# Patient Record
Sex: Male | Born: 1950
Health system: Southern US, Community
[De-identification: ages and names within clinical notes are randomized; demographics above are authoritative.]

## PROBLEM LIST (undated history)

## (undated) DIAGNOSIS — G43909 Migraine, unspecified, not intractable, without status migrainosus: Secondary | ICD-10-CM

## (undated) DIAGNOSIS — F329 Major depressive disorder, single episode, unspecified: Secondary | ICD-10-CM

## (undated) DIAGNOSIS — D369 Benign neoplasm, unspecified site: Secondary | ICD-10-CM

## (undated) DIAGNOSIS — I619 Nontraumatic intracerebral hemorrhage, unspecified: Secondary | ICD-10-CM

## (undated) DIAGNOSIS — Z8719 Personal history of other diseases of the digestive system: Secondary | ICD-10-CM

## (undated) DIAGNOSIS — I2510 Atherosclerotic heart disease of native coronary artery without angina pectoris: Secondary | ICD-10-CM

## (undated) DIAGNOSIS — T887XXA Unspecified adverse effect of drug or medicament, initial encounter: Secondary | ICD-10-CM

## (undated) DIAGNOSIS — R42 Dizziness and giddiness: Secondary | ICD-10-CM

## (undated) DIAGNOSIS — G4700 Insomnia, unspecified: Secondary | ICD-10-CM

## (undated) DIAGNOSIS — F32A Depression, unspecified: Secondary | ICD-10-CM

## (undated) DIAGNOSIS — F319 Bipolar disorder, unspecified: Secondary | ICD-10-CM

## (undated) DIAGNOSIS — I7121 Aneurysm of the ascending aorta, without rupture: Secondary | ICD-10-CM

## (undated) DIAGNOSIS — E785 Hyperlipidemia, unspecified: Secondary | ICD-10-CM

## (undated) DIAGNOSIS — N411 Chronic prostatitis: Secondary | ICD-10-CM

## (undated) HISTORY — DX: Bipolar disorder, unspecified: F31.9

## (undated) HISTORY — DX: Insomnia, unspecified: G47.00

## (undated) HISTORY — DX: Chronic prostatitis: N41.1

## (undated) HISTORY — PX: OTHER SURGICAL HISTORY: SHX169

## (undated) HISTORY — DX: Hyperlipidemia, unspecified: E78.5

## (undated) HISTORY — DX: Personal history of other diseases of the digestive system: Z87.19

## (undated) HISTORY — DX: Atherosclerotic heart disease of native coronary artery without angina pectoris: I25.10

## (undated) HISTORY — DX: Unspecified adverse effect of drug or medicament, initial encounter: T88.7XXA

## (undated) HISTORY — DX: Depression, unspecified: F32.A

## (undated) HISTORY — DX: Nontraumatic intracerebral hemorrhage, unspecified: I61.9

## (undated) HISTORY — DX: Benign neoplasm, unspecified site: D36.9

## (undated) HISTORY — DX: Dizziness and giddiness: R42

## (undated) HISTORY — DX: Aneurysm of the ascending aorta, without rupture: I71.21

## (undated) HISTORY — DX: Migraine, unspecified, not intractable, without status migrainosus: G43.909

---
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
## 2000-08-31 DIAGNOSIS — Z8719 Personal history of other diseases of the digestive system: Secondary | ICD-10-CM

## 2000-08-31 HISTORY — PX: COLONOSCOPY: SHX174

## 2000-08-31 HISTORY — PX: OTHER SURGICAL HISTORY: SHX169

## 2000-08-31 HISTORY — DX: Personal history of other diseases of the digestive system: Z87.19

## 2001-03-21 ENCOUNTER — Encounter: Payer: Self-pay | Admitting: Emergency Medicine

## 2001-03-21 ENCOUNTER — Inpatient Hospital Stay (HOSPITAL_COMMUNITY): Admission: EM | Admit: 2001-03-21 | Discharge: 2001-03-25 | Payer: Self-pay | Admitting: Emergency Medicine

## 2001-03-31 ENCOUNTER — Encounter: Payer: Self-pay | Admitting: Gastroenterology

## 2001-03-31 ENCOUNTER — Ambulatory Visit (HOSPITAL_COMMUNITY): Admission: RE | Admit: 2001-03-31 | Discharge: 2001-03-31 | Payer: Self-pay | Admitting: Gastroenterology

## 2001-05-27 ENCOUNTER — Ambulatory Visit (HOSPITAL_COMMUNITY): Admission: RE | Admit: 2001-05-27 | Discharge: 2001-05-27 | Payer: Self-pay | Admitting: Gastroenterology

## 2001-05-27 ENCOUNTER — Encounter: Payer: Self-pay | Admitting: Gastroenterology

## 2001-08-26 ENCOUNTER — Ambulatory Visit (HOSPITAL_COMMUNITY): Admission: RE | Admit: 2001-08-26 | Discharge: 2001-08-26 | Payer: Self-pay | Admitting: Orthopedic Surgery

## 2002-11-30 ENCOUNTER — Encounter: Admission: RE | Admit: 2002-11-30 | Discharge: 2002-11-30 | Payer: Self-pay | Admitting: Sports Medicine

## 2003-04-13 ENCOUNTER — Other Ambulatory Visit: Admission: RE | Admit: 2003-04-13 | Discharge: 2003-04-13 | Payer: Self-pay | Admitting: Family Medicine

## 2010-01-29 DIAGNOSIS — F319 Bipolar disorder, unspecified: Secondary | ICD-10-CM

## 2010-01-29 HISTORY — DX: Bipolar disorder, unspecified: F31.9

## 2010-07-01 DIAGNOSIS — R42 Dizziness and giddiness: Secondary | ICD-10-CM

## 2010-07-01 HISTORY — DX: Dizziness and giddiness: R42

## 2011-01-16 NOTE — Procedures (Signed)
[place]. [facility]  Patient:    Bryce Logan, Bryce Logan                          MRN: [id number] Proc. Date: 03/21/01 Adm. Date:  [id number] Attending:  Roque Lias CC:         Reuben Likes, M.D.  Carolyne Fiscal, M.D.   Procedure Report  PROCEDURE PERFORMED:  Esophagogastroduodenoscopy.  ENDOSCOPIST:  Everardo All. Madilyn Fireman, M.D.  INDICATIONS FOR PROCEDURE:  GI bleeding with suspected upper source.  DESCRIPTION OF PROCEDURE:  The patient was placed in the left lateral decubitus position and placed on the pulse monitor with continuous low flow oxygen delivered by nasal cannula.  He was sedated with 30 mg IV Demerol and 7 mg IV Versed.  The Olympus video endoscope was advanced under direct vision into the oropharynx and esophagus.  The esophagus was straight and of normal caliber with the squamocolumnar line at 38 cm.  There appeared to be a subtle lower esophageal ring above an approximately 1 cm hiatal hernia.  There was no visible esophagitis, stricture, esophageal varices, Mallory-Weiss tear or other abnormality of the gastroesophageal junction.  The stomach was entered and a small amount of liquid secretions was suctioned from the fundus. Retroflex view of the cardia was unremarkable.  The fundus and body appeared normal.  The antrum showed a few streaks of erythema with granularity consistent with antral gastritis.  The pylorus was nondeformed and easily allowed passage of the endoscope tip to the duodenum.  Both the bulb and second portion were well inspected and appeared within normal limits.  The scope was withdrawn into the stomach and a CLO test obtained.  The scope was then withdrawn and the patient returned to the recovery room in stable condition.  The patient tolerated the procedure well and there were no immediate complications.  IMPRESSION: 1. Mild antral gastritis. 2. Small hiatal hernia with lower esophageal ring. 3. No obvious upper  source of gastrointestinal bleeding.  PLAN:  Transfuse as needed and will pursue colonoscopy within the next few days.DD:  03/21/01 TD:  03/22/01 Job: [id number]

## 2011-01-16 NOTE — Op Note (Signed)
[facility]  Patient:    Bryce Logan, Bryce Logan Visit Number: [id number] MRN: [id number]          Service Type: DSU Location: DAY Attending Physician:  Marlowe Kays Page Dictated by:   Illene Labrador. Aplington, M.D. Proc. Date: 08/26/01 Admit Date:  08/26/2001                             Operative Report  PREOPERATIVE DIAGNOSIS:  Torn medial meniscus, left knee.  POSTOPERATIVE DIAGNOSIS:  Torn medial meniscus, left knee.  OPERATION:  Left knee arthroscopy with partial medial meniscectomy.  SURGEON:  Illene Labrador. Aplington, M.D.  ASSISTANT:  Nurse.  ANESTHESIA:  General.  PATHOLOGY/JUSTIFICATION FOR PROCEDURE:  He has medial knee pain for several years, and feeling something slipping in and out of the inner knee.  An MRI has indicated he has a comminuted posterior horn tear of the medial meniscus. He actually had a bucket-handle type tear with also disruption of the anterior third, with a good bit of fibrotic synovitis.  The rest of the knee looked unremarkable.  DESCRIPTION OF PROCEDURE:  Under satisfactory general anesthesia, pneumatic tourniquet and thigh stabilizer placed, the left knee was prepped with Duraprep and draped as a sterile field.  Superior and medial saline inflow. Posterior and anterolateral portal, the medial compartment of the knee joint was evaluated.  Extensive fibrotic synovium was noted in the anterior portion, which was resected with 3.5 shaver.  A flap-type tear of the medial meniscus was also noted, and I smoothed this down to a stable rim.  The joint surfaces looked unremarkable posteriorly.  I was able to place a probe in the posterior underneath surface of the meniscus, and bring it well into the joint.  The underneath surface tear revealed the surface of the underneath tear.  I then prepped the meniscus of delta junction of the mid posterior third with scissors, and then gradually worked my way back with baskets until I  arrived at a stable rim posteriorly; which left available rim remaining.  I tailored the junction of the mid posterior third with scissors, baskets and superinfarct shaver.  Also carried the tailoring around to basically the intercondylar attachment of the medial meniscus.  A final endpoint was very small remaining posterior rim, which was stable.  Final pictures were taken. I looked at the knee again and suprapatellar area; there was minimal wear on the patella.  Reversing pulls of the lateral compartment of knee joint looked unremarkable.  The lateral pedal was also unremarkable.  The knee joint was then irrigated until clear, and all ______ were removed.  The trans portals were closed with 4-0 nylon.  Then 20 cc of 0.5% Marcaine with adrenaline and 4 mg of morphine were then injected into the inflow portal, which was removed, and this portal closed with 4-0 nylon as well.   Betadine and Adaptic dry sterile dressing were applied.  The tourniquet was released.  He tolerated the procedure well, and was taken to the recovery room in satisfactory condition with no known complication. Dictated by:   Illene Labrador. Aplington, M.D. Attending Physician:  Joaquin Courts DD:  08/26/01 TD:  08/27/01 Job: [id number]

## 2011-01-16 NOTE — Discharge Summary (Signed)
[place]. [facility]  Patient:    Bryce Logan, Bryce Logan                          MRN: [id number] Adm. Date:  [id number] Disc. Date: [id number] Attending:  Roque Lias CC:         Carolyne Fiscal, M.D.   Discharge Summary  HISTORY OF PRESENT ILLNESS:  The patient is a 60 year old male who had had a five-day history of profound weakness, fatigue, and postural presyncope. Shortly after the onset of these symptoms, he noted dark maroon-colored blood in his stool.  He was seen in our office on Jan 16, 2001, at which time his hemoglobin was 13.3 and his CMET was within normal limits.  Since that time, he continued to pass maroon-colored blood and has gotten progressively weaker.  He denied any abdominal pain, nausea, or vomiting.  He had a similar episode in 1994.  Work-up at that time showed only gastritis.  The patient denies any use of NSAIDs, aspirin, alcohol, or tobacco.  PHYSICAL EXAMINATION:  The blood pressure was 82/50, the pulse was 88, respirations were 20, and the temperature was 95.8 degrees.  GENERAL APPEARANCE:  The patient appeared pale and listless.  He was lying on the exam table and unable to sit up.  SKIN:  Pale, but warm and dry.  HEENT:  Eyes:  Pupils equal, round, and reactive to light.  Fundi benign.  The sclerae were somewhat pale.  ENT exam normal.  NECK:  No adenopathy.  Thyroid normal.  LUNGS:  Clear.  HEART:  Regular rhythm.  No gallop or murmur.  ABDOMEN:  Soft and nontender.  No organomegaly or mass.  Bowel sounds normally active.  RECTAL:  No masses.  Maroon-colored heme-positive stool.  EXTREMITIES:  Pulses were weak and thready.  NEUROLOGIC:  Within normal limits.  ADMITTING IMPRESSION:  Gastrointestinal bleed, possible upper gastrointestinal with the source being ulcer or gastritis or lower gastrointestinal possibly being diverticular bleed or angiodysplasia.  SUMMARY OF LABORATORY DATA:  His EKG on admission  showed normal sinus rhythm. The chest x-ray showed probable COPD with hyperaeration, otherwise was normal with no active lung disease.  On admission, his hemoglobin was 7.2, hematocrit 20.0, and white count 8900. After infusion, his hemoglobin increased to 9.1, but then fell to 7.7.  On March 23, 2001, after another two units of packed cells, his hemoglobin remained stable and as of the day of discharge was 10.6.  His INR was 1.2. CMET showed a sodium of 136, potassium 4.0, glucose 147, BUN 24, and creatinine 0.9.  His Helicobacter pylori antibody was negative.  HOSPITAL COURSE:  The patient was hospitalized in the step-down unit and vital signs were obtained every two hours.  He was given normal saline at 250 cc/hr. He was made NPO, except for clear sips and his medications.  He was given Pepcid 20 mg IV every 12 hours, alprazolam 0.5 mg every eight hours p.r.n., and Ambien for sleep.  He was given two units of packed red blood cells in the emergency room and also was seen by Jonny Ruiz C. Madilyn Fireman, M.D., in the emergency room and underwent upper endoscopy, which just showed some mild gastritis, but no evidence of upper GI bleeding or ulcer.  The following day, he had no passed any blood or stool since his endoscopy. His hemoglobin was up to 9.1.  He was prepped for a colonoscopy.  After his colonoscopy prep,  he did pass a large amount of dark red blood.  His colonoscopy was also done by Jonny Ruiz C. Madilyn Fireman, M.D., showed some residual dark blood, but no active bleeding.  This was continued all the way up into the terminal ileum.  No active bleeding site was noted.  Over the next several days, the patient had no further evidence of any bleeding.  His hemoglobin did drop to 7.7 on March 23, 2001, and he was given two more units of packed red blood cells.  On March 23, 2001, he had no evidence of further BMs or GI bleeding.  He was transferred to telemetry as of that date.  His hemoglobin immediately  after the transfusion was 11.4 and the next day was 10.3.  He was observed over the next 24 hours.  As of March 25, 2001, he passed a normal brown stool the night before without blood.  He had no abdominal pain, nausea, or vomiting.  His blood pressure was 105/64 and his pulse was 75.  The lungs were clear.  The cardiac rhythm was regular with no murmur.  The abdomen was negative.  The CBC showed a hemoglobin of 10.6.  He was discharged on that date to be followed up by Carolyne Fiscal, M.D., and Everardo All. Madilyn Fireman, M.D.  Dr. Madilyn Fireman did order a small bowel series to be done as well.  FINAL DIAGNOSES: 1. Gastrointestinal bleed of undetermined cause.  It is suspected that this    could be angiodysplasia of the small intestine.  There was no evidence in    the upper gastrointestinal or lower gastrointestinal of any obvious    bleeding site. 2. Anemia secondary to blood loss. 3. Mild gastritis on upper endoscopy.  DISCHARGE MEDICATIONS:  Ferrous sulfate 325 mg t.i.d. with food.  ACTIVITY:  As tolerated.  DIET:  Regular.  FOLLOW-UP:  He is to follow up with Carolyne Fiscal, M.D., in a week and also to follow up with Coner C. Madilyn Fireman, M.D. DD:  04/09/01 TD:  04/10/01 Job: [id number]

## 2011-09-01 DIAGNOSIS — I619 Nontraumatic intracerebral hemorrhage, unspecified: Secondary | ICD-10-CM

## 2011-09-01 DIAGNOSIS — S0636AA Traumatic hemorrhage of cerebrum, unspecified, with loss of consciousness status unknown, initial encounter: Secondary | ICD-10-CM

## 2011-09-01 HISTORY — DX: Nontraumatic intracerebral hemorrhage, unspecified: I61.9

## 2011-09-01 HISTORY — DX: Traumatic hemorrhage of cerebrum, unspecified, with loss of consciousness status unknown, initial encounter: S06.36AA

## 2011-09-17 DIAGNOSIS — D369 Benign neoplasm, unspecified site: Secondary | ICD-10-CM

## 2011-09-17 HISTORY — DX: Benign neoplasm, unspecified site: D36.9

## 2013-09-19 ENCOUNTER — Other Ambulatory Visit (HOSPITAL_COMMUNITY): Payer: Self-pay | Admitting: Family Medicine

## 2013-09-19 DIAGNOSIS — I359 Nonrheumatic aortic valve disorder, unspecified: Secondary | ICD-10-CM

## 2013-09-28 ENCOUNTER — Ambulatory Visit (HOSPITAL_COMMUNITY)
Admission: RE | Admit: 2013-09-28 | Discharge: 2013-09-28 | Disposition: A | Discharge status: Home / Self Care | Payer: BC Managed Care – PPO | Source: Ambulatory Visit | Attending: Family Medicine | Admitting: Family Medicine

## 2013-09-28 DIAGNOSIS — I359 Nonrheumatic aortic valve disorder, unspecified: Secondary | ICD-10-CM | POA: Insufficient documentation

## 2013-09-28 NOTE — Progress Notes (Signed)
Echocardiogram 2D Echocardiogram has been performed.  Bryce Logan 09/28/2013, 3:15 PM

## 2013-12-18 ENCOUNTER — Other Ambulatory Visit: Payer: Self-pay | Admitting: Cardiology

## 2013-12-18 DIAGNOSIS — I712 Thoracic aortic aneurysm, without rupture, unspecified: Secondary | ICD-10-CM

## 2013-12-25 ENCOUNTER — Ambulatory Visit
Admission: RE | Admit: 2013-12-25 | Discharge: 2013-12-25 | Disposition: A | Discharge status: Home / Self Care | Payer: BC Managed Care – PPO | Source: Ambulatory Visit | Attending: Cardiology | Admitting: Cardiology

## 2013-12-25 DIAGNOSIS — I712 Thoracic aortic aneurysm, without rupture, unspecified: Secondary | ICD-10-CM

## 2015-02-20 ENCOUNTER — Other Ambulatory Visit: Payer: Self-pay | Admitting: Cardiology

## 2015-02-20 DIAGNOSIS — I719 Aortic aneurysm of unspecified site, without rupture: Secondary | ICD-10-CM

## 2015-02-20 DIAGNOSIS — I7121 Aneurysm of the ascending aorta, without rupture: Secondary | ICD-10-CM

## 2015-03-05 ENCOUNTER — Ambulatory Visit
Admission: RE | Admit: 2015-03-05 | Discharge: 2015-03-05 | Disposition: A | Discharge status: Home / Self Care | Payer: 59 | Source: Ambulatory Visit | Attending: Cardiology | Admitting: Cardiology

## 2015-03-05 DIAGNOSIS — I719 Aortic aneurysm of unspecified site, without rupture: Secondary | ICD-10-CM

## 2015-03-05 DIAGNOSIS — I7121 Aneurysm of the ascending aorta, without rupture: Secondary | ICD-10-CM

## 2015-03-05 MED ORDER — IOPAMIDOL (ISOVUE-370) INJECTION 76%
80.0000 mL | Freq: Once | INTRAVENOUS | Status: AC | PRN
  Administered 2015-03-05: 80 mL via INTRAVENOUS

## 2016-07-07 ENCOUNTER — Other Ambulatory Visit (HOSPITAL_COMMUNITY): Payer: Self-pay | Attending: Psychiatry | Admitting: Psychiatry

## 2016-07-07 ENCOUNTER — Encounter (INDEPENDENT_AMBULATORY_CARE_PROVIDER_SITE_OTHER): Payer: Self-pay

## 2016-07-07 DIAGNOSIS — F332 Major depressive disorder, recurrent severe without psychotic features: Secondary | ICD-10-CM

## 2016-07-07 NOTE — Progress Notes (Signed)
Psychiatric Initial Adult Assessment   Patient Identification: Bryce Logan MRN:  [id number] Date of Evaluation:  07/07/2016 Referral Source: Dr Toy Care Chief Complaintsevere depression not responding to medications  Visit Diagnosis:    ICD-9-CM ICD-10-CM   1. Severe recurrent major depression without psychotic features [facility]) 296.33 F33.2     History of Present Illness:  Bryce Logan has been depressed all his adult life and he says it has been consistent after taking floxin for prostate problems.  He has no energy, interest, motivation, feelings of hopelessness, no point in living, trouble getting and staying asleep, no pleasure in people or activities except music.  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  He has tried all the medications for depression and some of the augmentation medications without consistent help.  No diagnosis of bipolar or psychosis.  He has been diagnosed with adult ADHD and does respond to stimulants.  Anxiety is present but not extreme and is tied to the depression. Childhood was uneventful.  College was a real struggle due to the apparent ADHD.  Drug use is not an issue.  He was married in the 51's but not since ad has a son.  Associated Signs/Symptoms: Depression Symptoms:  depressed mood, anhedonia, insomnia, fatigue, feelings of worthlessness/guilt, difficulty concentrating, hopelessness, impaired memory, anxiety, loss of energy/fatigue, decreased appetite, (Hypo) Manic Symptoms:  Irritable Mood, Anxiety Symptoms:  Excessive Worry, Psychotic Symptoms:  none PTSD Symptoms: Negative  Past Psychiatric History: as related above  Previous Psychotropic Medications: Yes   Substance Abuse History in the last 12 months:  No.  Consequences of Substance Abuse: Negative  Past Medical History:  Past Medical History:  Diagnosis Date  . Chronic prostatitis    ELEVATED PSA . ED DR. DAHLSTEDT  . Dizziness 11/11   DR. ROSEN, NORMAL EF, MILD LEAKINESS OF  MITRAL VALVE, MILD STIFFNESS OF HEART MUSCLE, MILD ENLARGEMENT OF AORTIC ROOT, REPEAT IN 1 YEAR DR. Radford Pax, NL ETT DR. Radford Pax 12/11  . H/O: GI bleed 2002   DR. HAYES   . Insomnia    ON XANAX, DR. Sabra Heck  . Intracerebral hematoma 2013   [place]  . Mild bipolar disorder 01/2010   DR. PITTMAN DR. Toy Care, CHR DEPRESSION, FATIGUE OFF MEDS FOR AWHILE, CONTROLLED  . Tubular adenoma 09/17/11   [facility]    Past Surgical History:  Procedure Laterality Date  . COLONOSCOPY  2002  . LEFT KNEE ATHROSCOPY  2002  . LEFT LEG AMBULATORY PHLEBECTOMY  2002    Family Psychiatric History: none of relevance  Family History:  Family History  Problem Relation Age of Onset  . Cancer - Other Mother   . Hypertension Father   . Melanoma Sister   . Alcoholism Son     Social History:   Social History   Social History  . Marital status: Divorced    Spouse name: N/A  . Number of children: N/A  . Years of education: N/A   Social History Main Topics  . Smoking status: Never Smoker  . Smokeless tobacco: Not on file  . Alcohol use Yes     Comment: 2-6 PER WEEK  . Drug use: No  . Sexual activity: Not on file   Other Topics Concern  . Not on file   Social History Narrative  . No narrative on file    Additional Social History: none  Allergies:   Allergies  Allergen Reactions  . Sulfa Antibiotics     SEVERE FATIGUE  AND SIDE EFFECTS    Metabolic Disorder Labs: No results found for: HGBA1C, MPG No results found for: PROLACTIN No results found for: CHOL, TRIG, HDL, CHOLHDL, VLDL, LDLCALC   Current Medications: Current Outpatient Prescriptions  Medication Sig Dispense Refill  . ALPRAZolam (XANAX) 1 MG tablet Take 1 mg by mouth at bedtime as needed for anxiety.    . Ascorbic Acid (VITAMIN C) 1000 MG tablet Take 1,000 mg by mouth daily.    Javier Docker Oil 1000 MG CAPS Take 1,000 mg by mouth daily.    . magnesium 30 MG tablet Take 30 mg by mouth daily.    .  Multiple Vitamin (MULTIVITAMIN) capsule Take 1 capsule by mouth daily.    . Multiple Vitamins-Minerals (ANTIOXIDANT FORMULA SG) capsule Take 1 capsule by mouth daily.    Marland Kitchen OVER THE COUNTER MEDICATION LITHIUM OROTATE DIATOMASHOUS EARTH    . Vitamin D, Ergocalciferol, (DRISDOL) 50000 UNITS CAPS capsule Take 50,000 Units by mouth every 7 (seven) days.    Marland Kitchen zolpidem (AMBIEN) 10 MG tablet Take 10 mg by mouth at bedtime as needed for sleep.     No current facility-administered medications for this visit.     Neurologic: Headache: Negative Seizure: Negative Paresthesias:Negative  Musculoskeletal: Strength & Muscle Tone: within normal limits Gait & Station: normal Patient leans: N/A  Psychiatric Specialty Exam: ROS  There were no vitals taken for this visit.There is no height or weight on file to calculate BMI.  General Appearance: Well Groomed  Eye Contact:  Good  Speech:  Clear and Coherent  Volume:  Normal  Mood:  Depressed  Affect:  Congruent  Thought Process:  Coherent  Orientation:  Full (Time, Place, and Person)  Thought Content:  Logical  Suicidal Thoughts:  No  Homicidal Thoughts:  No  Memory:  Immediate;   Good Recent;   Good Remote;   Good  Judgement:  Good  Insight:  Good  Psychomotor Activity:  Normal  Concentration:  Concentration: Good and Attention Span: Good  Recall:  Good  Fund of Knowledge:Good  Language: Good  Akathisia:  Negative  Handed:  Right  AIMS (if indicated):  0  Assets:  Communication Skills Desire for Improvement Financial Resources/Insurance Housing Leisure Time [place] Talents/Skills Transportation Vocational/Educational  ADL's:  Intact  Cognition: WNL  Sleep:  poor    Treatment Plan Summary: Bryce Logan does not have any metal implants and qualifies for [place].  He elects to try ECT first and if needed will return to [place] in the future.   Donnelly Angelica, MD 11/7/20172:15 PM

## 2016-09-18 ENCOUNTER — Institutional Professional Consult (permissible substitution): Payer: Self-pay | Admitting: Psychiatry

## 2016-10-09 ENCOUNTER — Encounter: Payer: Self-pay | Admitting: Psychiatry

## 2016-10-09 ENCOUNTER — Ambulatory Visit (INDEPENDENT_AMBULATORY_CARE_PROVIDER_SITE_OTHER): Payer: BLUE CROSS/BLUE SHIELD | Admitting: Psychiatry

## 2016-10-09 VITALS — BP 125/84 | HR 96 | Temp 98.1°F | Wt 162.4 lb

## 2016-10-09 DIAGNOSIS — F332 Major depressive disorder, recurrent severe without psychotic features: Secondary | ICD-10-CM

## 2016-10-09 NOTE — Progress Notes (Signed)
ECT: Consult note for 66 year old man referred for consideration of ECT. Patient reports symptoms of chronically depressed mood with predominantly sad down negative mood almost all of the time. Negative thoughts persistently about himself and his life. Lack of enjoyment in almost any activities. Lack of motivation and decreased energy. Interrupted sleep at night. Occasional suicidal thoughts without intent or plan. All of these symptoms have been present almost continuously for over 20 years. There have been some periods of worsening symptoms and a few very brief periods of some improvement but he does not think he has ever had a full remission of symptoms. Patient has chronic stresses from loneliness and a lack of enjoyable activities and relationships. He denies any psychotic symptoms. Denies any symptoms of mania area patient is currently taking Xanax and Ambien at night both as aids to sleep primarily. He is taking 12.5 mg of lamotrigine per day in some effort to treat depressive symptoms. He is not seeing a psychotherapist. Does not have other active medical problems. Drinks only occasionally. Denies abuse of other drugs.  Social history: Patient is divorced. 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 Performing music sounds like it's the most consistently enjoyable thing in his life. Does have an adult son and has some relationship with him. Has an ex-wife and has some friendly relationship with her. Not actively dating anyone.  Medical history: No history of heart disease or diabetes or heart attack or stroke. Has been told he has a small abdominal aneurysm that does not require immediate treatment. Not currently taking any other medications for medical issues.  Substance abuse history: Denies any history of alcohol or drug abuse. Says that he drinks very occasionally maybe only a few glasses of wine a week. Denies it ever being in  abusive problem. Denies use of other drugs.  Patient has been receiving psychiatric treatment since the 1990s. He has never been psychiatrically hospitalized. Never tried to kill himself. No history of psychosis or violence. He has tried multiple medications over the years including multiple antidepressants, mood stabilizers, trials of Adderall for energy. He says that some medicines have briefly made him feel better but so far everything he has tried has caused significant side effects and has ultimately been intolerable. As mentioned above denies any history of mania. Has only seen a therapist for a relatively short period of time and it sounds like the therapy from what he describes was nonspecific in type.  Mental status exam: This is a neatly dressed man who is here on time. Very appropriate and cooperative. Good eye contact. Normal psychomotor activity. Normal speech pattern. Appears to be forthcoming and honest in his history. Affect is reactive and full range. Mood stated as chronically depressed. Thoughts are lucid without any sign of thought disorder. Denies hallucinations. Has some vague chronic suicidal thoughts without any intention or plan. No homicidal thoughts. He is alert and oriented 4. Short and long-term memory intact. Appears to be cognitively very normally functional.  This is a 66 year old man with chronic symptoms of depression of a moderate severity. Have caused some diminished enjoyment in life but have not resulted in life-threatening illness or hospitalization. He has not responded to multiple trials of medication. He is interested in ECT.  Patient was educated about ECT with a full description of the practical nature of the treatment. We reviewed common and less common side effects and potential for improvement agent was given ample opportunity  to ask questions.  Although his diagnosis would be appropriate for ECT as would his nonresponse to multiple medications, we discussed  some practical concerns. Patient does not feel comfortable with the idea of being unable to perform his job normally even for a few weeks. He also lives by himself and would have high risk of complications if he became confused. His history of multiple side effects to medicines in the past suggest an increased sensitivity to side effects.  I advised the patient that I would be willing to do the treatment but wanted to be honest with him about potential risks and side effects. Ultimately he decided not to commit to ECT at this time. He is considering going back to the transcranial magnetic stimulation clinic and discussing treatment with them. I told him that this sounded like it would probably be more suitable to his lifestyle anyway. Patient is certainly welcome to get back in touch with me if his circumstances change or he has further questions. No prescriptions written. No follow-up appointment made.

## 2017-01-29 DIAGNOSIS — K5790 Diverticulosis of intestine, part unspecified, without perforation or abscess without bleeding: Secondary | ICD-10-CM

## 2017-01-29 HISTORY — DX: Diverticulosis of intestine, part unspecified, without perforation or abscess without bleeding: K57.90

## 2018-01-27 DIAGNOSIS — I2510 Atherosclerotic heart disease of native coronary artery without angina pectoris: Secondary | ICD-10-CM | POA: Diagnosis not present

## 2018-01-27 DIAGNOSIS — E785 Hyperlipidemia, unspecified: Secondary | ICD-10-CM | POA: Diagnosis not present

## 2018-01-27 DIAGNOSIS — I712 Thoracic aortic aneurysm, without rupture: Secondary | ICD-10-CM | POA: Diagnosis not present

## 2018-02-01 ENCOUNTER — Other Ambulatory Visit: Payer: Self-pay | Admitting: Cardiology

## 2018-02-01 DIAGNOSIS — I712 Thoracic aortic aneurysm, without rupture, unspecified: Secondary | ICD-10-CM

## 2018-03-02 ENCOUNTER — Ambulatory Visit
Admission: RE | Admit: 2018-03-02 | Discharge: 2018-03-02 | Disposition: A | Discharge status: Home / Self Care | Payer: Medicare HMO | Source: Ambulatory Visit | Attending: Cardiology | Admitting: Cardiology

## 2018-03-02 DIAGNOSIS — I712 Thoracic aortic aneurysm, without rupture, unspecified: Secondary | ICD-10-CM

## 2018-04-13 DIAGNOSIS — R5383 Other fatigue: Secondary | ICD-10-CM | POA: Diagnosis not present

## 2018-04-14 DIAGNOSIS — R0600 Dyspnea, unspecified: Secondary | ICD-10-CM | POA: Diagnosis not present

## 2018-04-14 DIAGNOSIS — I2510 Atherosclerotic heart disease of native coronary artery without angina pectoris: Secondary | ICD-10-CM | POA: Diagnosis not present

## 2018-04-14 DIAGNOSIS — I712 Thoracic aortic aneurysm, without rupture: Secondary | ICD-10-CM | POA: Diagnosis not present

## 2018-04-14 DIAGNOSIS — E785 Hyperlipidemia, unspecified: Secondary | ICD-10-CM | POA: Diagnosis not present

## 2018-04-18 ENCOUNTER — Encounter: Payer: Self-pay | Admitting: Cardiology

## 2018-04-18 DIAGNOSIS — R0609 Other forms of dyspnea: Secondary | ICD-10-CM | POA: Diagnosis not present

## 2018-05-03 DIAGNOSIS — R194 Change in bowel habit: Secondary | ICD-10-CM | POA: Diagnosis not present

## 2018-05-03 DIAGNOSIS — R5383 Other fatigue: Secondary | ICD-10-CM | POA: Diagnosis not present

## 2018-05-03 DIAGNOSIS — Z23 Encounter for immunization: Secondary | ICD-10-CM | POA: Diagnosis not present

## 2018-09-19 DIAGNOSIS — L814 Other melanin hyperpigmentation: Secondary | ICD-10-CM | POA: Diagnosis not present

## 2018-09-19 DIAGNOSIS — L821 Other seborrheic keratosis: Secondary | ICD-10-CM | POA: Diagnosis not present

## 2018-09-19 DIAGNOSIS — D225 Melanocytic nevi of trunk: Secondary | ICD-10-CM | POA: Diagnosis not present

## 2018-09-19 DIAGNOSIS — L578 Other skin changes due to chronic exposure to nonionizing radiation: Secondary | ICD-10-CM | POA: Diagnosis not present

## 2018-09-19 DIAGNOSIS — B079 Viral wart, unspecified: Secondary | ICD-10-CM | POA: Diagnosis not present

## 2018-09-19 DIAGNOSIS — D1801 Hemangioma of skin and subcutaneous tissue: Secondary | ICD-10-CM | POA: Diagnosis not present

## 2018-09-19 DIAGNOSIS — L570 Actinic keratosis: Secondary | ICD-10-CM | POA: Diagnosis not present

## 2018-09-22 DIAGNOSIS — Z6824 Body mass index (BMI) 24.0-24.9, adult: Secondary | ICD-10-CM | POA: Diagnosis not present

## 2018-09-22 DIAGNOSIS — G4700 Insomnia, unspecified: Secondary | ICD-10-CM | POA: Diagnosis not present

## 2018-09-22 DIAGNOSIS — F331 Major depressive disorder, recurrent, moderate: Secondary | ICD-10-CM | POA: Diagnosis not present

## 2018-09-22 DIAGNOSIS — I77810 Thoracic aortic ectasia: Secondary | ICD-10-CM | POA: Diagnosis not present

## 2018-10-03 DIAGNOSIS — L570 Actinic keratosis: Secondary | ICD-10-CM | POA: Diagnosis not present

## 2018-11-07 DIAGNOSIS — L570 Actinic keratosis: Secondary | ICD-10-CM | POA: Diagnosis not present

## 2018-11-07 DIAGNOSIS — B078 Other viral warts: Secondary | ICD-10-CM | POA: Diagnosis not present

## 2018-11-15 DIAGNOSIS — H2513 Age-related nuclear cataract, bilateral: Secondary | ICD-10-CM | POA: Diagnosis not present

## 2018-11-15 DIAGNOSIS — H04123 Dry eye syndrome of bilateral lacrimal glands: Secondary | ICD-10-CM | POA: Diagnosis not present

## 2018-11-17 DIAGNOSIS — G4700 Insomnia, unspecified: Secondary | ICD-10-CM | POA: Diagnosis not present

## 2018-11-17 DIAGNOSIS — F331 Major depressive disorder, recurrent, moderate: Secondary | ICD-10-CM | POA: Diagnosis not present

## 2019-02-03 ENCOUNTER — Encounter: Payer: Self-pay | Admitting: *Deleted

## 2019-02-03 ENCOUNTER — Telehealth: Payer: Self-pay | Admitting: Cardiology

## 2019-02-03 DIAGNOSIS — I2510 Atherosclerotic heart disease of native coronary artery without angina pectoris: Secondary | ICD-10-CM

## 2019-02-03 DIAGNOSIS — E785 Hyperlipidemia, unspecified: Secondary | ICD-10-CM

## 2019-02-03 DIAGNOSIS — I712 Thoracic aortic aneurysm, without rupture, unspecified: Secondary | ICD-10-CM

## 2019-02-03 DIAGNOSIS — R079 Chest pain, unspecified: Secondary | ICD-10-CM

## 2019-02-03 DIAGNOSIS — R0600 Dyspnea, unspecified: Secondary | ICD-10-CM

## 2019-02-03 NOTE — Telephone Encounter (Signed)
Virtual Visit Pre-Appointment Phone Call  "(Name), I am calling you today to discuss your upcoming appointment. We are currently trying to limit exposure to the virus that causes COVID-19 by seeing patients at home rather than in the office."  1. "What is the BEST phone number to call the day of the visit?" - include this in appointment notes  2. Do you have or have access to (through a family member/friend) a smartphone with video capability that we can use for your visit?" a. If yes - list this number in appt notes as cell (if different from BEST phone #) and list the appointment type as a VIDEO visit in appointment notes b. If no - list the appointment type as a PHONE visit in appointment notes  3. Confirm consent - "In the setting of the current Covid19 crisis, you are scheduled for a (phone or video) visit with your provider on (date) at (time).  Just as we do with many in-office visits, in order for you to participate in this visit, we must obtain consent.  If you'd like, I can send this to your mychart (if signed up) or email for you to review.  Otherwise, I can obtain your verbal consent now.  All virtual visits are billed to your insurance company just like a normal visit would be.  By agreeing to a virtual visit, we'd like you to understand that the technology does not allow for your provider to perform an examination, and thus may limit your provider's ability to fully assess your condition. If your provider identifies any concerns that need to be evaluated in person, we will make arrangements to do so.  Finally, though the technology is pretty good, we cannot assure that it will always work on either your or our end, and in the setting of a video visit, we may have to convert it to a phone-only visit.  In either situation, we cannot ensure that we have a secure connection.  Are you willing to proceed?" STAFF: Did the patient verbally acknowledge consent to telehealth visit? Document  YES/NO here: Yes per pt/  4. Advise patient to be prepared - "Two hours prior to your appointment, go ahead and check your blood pressure, pulse, oxygen saturation, and your weight (if you have the equipment to check those) and write them all down. When your visit starts, your provider will ask you for this information. If you have an Apple Watch or Kardia device, please plan to have heart rate information ready on the day of your appointment. Please have a pen and paper handy nearby the day of the visit as well."  5. Give patient instructions for MyChart download to smartphone OR Doximity/Doxy.me as below if video visit (depending on what platform provider is using)  6. Inform patient they will receive a phone call 15 minutes prior to their appointment time (may be from unknown caller ID) so they should be prepared to answer    TELEPHONE CALL NOTE  Bryce Logan has been deemed a candidate for a follow-up tele-health visit to limit community exposure during the Covid-19 pandemic. I spoke with the patient via phone to ensure availability of phone/video source, confirm preferred email & phone number, and discuss instructions and expectations.  I reminded Bryce Logan to be prepared with any vital sign and/or heart rhythm information that could potentially be obtained via home monitoring, at the time of his visit. I reminded Bryce Logan to expect a phone call  prior to his visit.  Bryce Logan 02/03/2019 4:22 PM   INSTRUCTIONS FOR DOWNLOADING THE MYCHART APP TO SMARTPHONE  - The patient must first make sure to have activated MyChart and know their login information - If Apple, go to [employer] and type in MyChart in the search bar and download the app. If Android, ask patient to go to [employer] and type in [place] in the search bar and download the app. The app is free but as with any other app downloads, their phone may require them to verify saved payment information or Apple/Android  password.  - The patient will need to then log into the app with their MyChart username and password, and select [facility] as their healthcare provider to link the account. When it is time for your visit, go to the MyChart app, find appointments, and click Begin Video Visit. Be sure to Select Allow for your device to access the Microphone and Camera for your visit. You will then be connected, and your provider will be with you shortly.  **If they have any issues connecting, or need assistance please contact MyChart service desk [phone])**  **If using a computer, in order to ensure the best quality for their visit they will need to use either of the following Internet Browsers: [employer], or Google Chrome**  IF USING DOXIMITY or DOXY.ME - The patient will receive a link just prior to their visit by text.     FULL LENGTH CONSENT FOR TELE-HEALTH VISIT   I hereby voluntarily request, consent and authorize [facility] and its employed or contracted physicians, physician assistants, nurse practitioners or other licensed health care professionals (the Practitioner), to provide me with telemedicine health care services (the Services") as deemed necessary by the treating Practitioner. I acknowledge and consent to receive the Services by the Practitioner via telemedicine. I understand that the telemedicine visit will involve communicating with the Practitioner through live audiovisual communication technology and the disclosure of certain medical information by electronic transmission. I acknowledge that I have been given the opportunity to request an in-person assessment or other available alternative prior to the telemedicine visit and am voluntarily participating in the telemedicine visit.  I understand that I have the right to withhold or withdraw my consent to the use of telemedicine in the course of my care at any time, without affecting my right to future care or treatment,  and that the Practitioner or I may terminate the telemedicine visit at any time. I understand that I have the right to inspect all information obtained and/or recorded in the course of the telemedicine visit and may receive copies of available information for a reasonable fee.  I understand that some of the potential risks of receiving the Services via telemedicine include:   Delay or interruption in medical evaluation due to technological equipment failure or disruption;  Information transmitted may not be sufficient (e.g. poor resolution of images) to allow for appropriate medical decision making by the Practitioner; and/or   In rare instances, security protocols could fail, causing a breach of personal health information.  Furthermore, I acknowledge that it is my responsibility to provide information about my medical history, conditions and care that is complete and accurate to the best of my ability. I acknowledge that Practitioner's advice, recommendations, and/or decision may be based on factors not within their control, such as incomplete or inaccurate data provided by me or distortions of diagnostic images or specimens that may result from electronic  transmissions. I understand that the practice of medicine is not an exact science and that Practitioner makes no warranties or guarantees regarding treatment outcomes. I acknowledge that I will receive a copy of this consent concurrently upon execution via email to the email address I last provided but may also request a printed copy by calling the office of [place] [facility].    I understand that my insurance will be billed for this visit.   I have read or had this consent read to me.  I understand the contents of this consent, which adequately explains the benefits and risks of the Services being provided via telemedicine.   I have been provided ample opportunity to ask questions regarding this consent and the Services and have had my questions  answered to my satisfaction.  I give my informed consent for the services to be provided through the use of telemedicine in my medical care  By participating in this telemedicine visit I agree to the above.

## 2019-02-05 NOTE — Progress Notes (Signed)
Virtual Visit via Video Note   This visit type was conducted due to national recommendations for restrictions regarding the COVID-19 Pandemic (e.g. social distancing) in an effort to limit this patient's exposure and mitigate transmission in our community.  Due to his co-morbid illnesses, this patient is at least at moderate risk for complications without adequate follow up.  This format is felt to be most appropriate for this patient at this time.  All issues noted in this document were discussed and addressed.  A limited physical exam was performed with this format.  Please refer to the patient's chart for his consent to telehealth for [facility].   Date:  02/06/2019   ID:  Bryce Logan, DOB [birth year], MRN [id number]  Patient Location: Home Provider Location: Office  PCP:  Kathyrn Lass, MD  Cardiologist:  Shirlee More, MD  Electrophysiologist:  None   Evaluation Performed:  Follow-Up Visit  Chief Complaint:  Enlarged aorta  History of Present Illness:    Bryce Logan is a 68 y.o. male with CAD followed with Dr Wynonia Lawman last seen 04/14/2018.  At that time is complaining of shortness of breath and exercise intolerance.  He underwent CT of the chest without contrast showing stable a sending aorta 41 mm 03/02/2018.  Echocardiogram performed 04/19/2018 shows mild concentric LVH impaired relaxation mild right atrial enlargement with trace mitral and tricuspid regurgitation.  There is a recommendation undergo a stress test and there is no report in the chart.  His last lipid profile 10/27/2017 cholesterol 212 HDL 55 LDL 140  The patient does not have symptoms concerning for COVID-19 infection (fever, chills, cough, or new shortness of breath).   He relates to me he did have a stress test with Dr. Thurman Coyer office last year that was normal.  He has been taking over-the-counter red yeast rice and is pending labs with his PCP is statin intolerant.  No edema chest pain shortness of breath palpitation  or syncope.  He takes Adderall sporadically.  Past Medical History:  Diagnosis Date  . Chronic prostatitis    ELEVATED PSA . ED DR. DAHLSTEDT  . Coronary artery disease   . Depression   . Dizziness 11/11   DR. ROSEN, NORMAL EF, MILD LEAKINESS OF MITRAL VALVE, MILD STIFFNESS OF HEART MUSCLE, MILD ENLARGEMENT OF AORTIC ROOT, REPEAT IN 1 YEAR DR. Radford Pax, NL ETT DR. Radford Pax 12/11  . H/O: GI bleed 2002   DR. HAYES   . Hyperlipidemia   . Insomnia    ON XANAX, DR. Sabra Heck  . Intracerebral hematoma ([place]) 2013   [facility]  . Mild bipolar disorder ([place]) 01/2010   DR. PITTMAN DR. [facility] Care, CHR DEPRESSION, FATIGUE OFF MEDS FOR AWHILE, CONTROLLED  . Tubular adenoma 09/17/11   [facility]   Past Surgical History:  Procedure Laterality Date  . COLONOSCOPY  2002  . LEFT KNEE ATHROSCOPY  2002  . LEFT LEG AMBULATORY PHLEBECTOMY  2002     Current Meds  Medication Sig  . ALPRAZolam (XANAX) 0.5 MG tablet Take 1 mg by mouth at bedtime.   Marland Kitchen amphetamine-dextroamphetamine (ADDERALL) 10 MG tablet Take 10 mg by mouth as directed. Takes 2.5mg  -5mg  daily as needed  . Ascorbic Acid (VITAMIN C) 1000 MG tablet Take 1,000 mg by mouth daily.  . Astaxanthin 4 MG CAPS Take 1 capsule by mouth daily.  . cetirizine (ZYRTEC) 10 MG tablet Take 10 mg by mouth daily as needed for allergies.  . Coenzyme Q10 (CO Q 10)  100 MG CAPS Take 1 capsule by mouth daily.  Marland Kitchen KRILL OIL PO Take 1 tablet by mouth daily. 1 - 2 grams daily  . Magnesium 300 MG CAPS Take 300 mg by mouth daily.   . NON FORMULARY LITHIUM OROTATE  . NON FORMULARY Sam-e 1200mg  daily  . Red Yeast Rice Extract (RED YEAST RICE PO) Take 1 tablet by mouth 2 (two) times a day.  . zolpidem (AMBIEN) 10 MG tablet Take 10 mg by mouth at bedtime as needed for sleep. Reports breaking tablet into 1/3 tablet     Allergies:   Sulfa antibiotics   Social History   Tobacco Use  . Smoking status: Never Smoker  . Smokeless tobacco: Never Used   Substance Use Topics  . Alcohol use: Yes    Alcohol/week: 2.0 - 6.0 standard drinks    Types: 2 - 6 Glasses of wine per week    Comment: 2-6 PER WEEK  . Drug use: Not Currently    Types: Marijuana     Family Hx: The patient's family history includes Alcohol abuse in his son; Alcoholism in his son; Cancer - Other in his mother; Heart disease in his father; Hypertension in his father; Melanoma in his sister.  ROS:   Please see the history of present illness.     All other systems reviewed and are negative.   Prior CV studies:   The following studies were reviewed today:    Labs/Other Tests and Data Reviewed:    EKG:  No ECG reviewed.  Recent Labs: No results found for requested labs within last 8760 hours.   Recent Lipid Panel No results found for: CHOL, TRIG, HDL, CHOLHDL, LDLCALC, LDLDIRECT  Wt Readings from Last 3 Encounters:  02/06/19 156 lb 9.6 oz (71 kg)     Objective:    Vital Signs:  Ht 5\' 8"  (1.727 m)   Wt 156 lb 9.6 oz (71 kg)   BMI 23.81 kg/m    VITAL SIGNS:  reviewed GEN:  no acute distress EYES:  sclerae anicteric, EOMI - Extraocular Movements Intact RESPIRATORY:  normal respiratory effort, symmetric expansion CARDIOVASCULAR:  no peripheral edema SKIN:  no rash, lesions or ulcers. MUSCULOSKELETAL:  no obvious deformities. NEURO:  alert and oriented x 3, no obvious focal deficit PSYCH:  normal affect  ASSESSMENT & PLAN:    1. Enlarged thoracic aorta stable I think we can wait till next year collect another data point and if he has not progressed I would stop doing screening thoracic aorta is at that time.  He has no family history of aortopathy and is not a high risk group. 2. Hyperlipidemia stable he is taken delude over-the-counter statin and I encouraged him to get a lipid profile in his PCP office.  If not at [facility] would be appropriate  COVID-19 Education: The signs and symptoms of COVID-19 were discussed with the patient and how to  seek care for testing (follow up with PCP or arrange E-visit).  The importance of social distancing was discussed today.  Time:   Today, I have spent 20 minutes with the patient with telehealth technology discussing the above problems.     Medication Adjustments/Labs and Tests Ordered: Current medicines are reviewed at length with the patient today.  Concerns regarding medicines are outlined above.   Tests Ordered: No orders of the defined types were placed in this encounter.   Medication Changes: No orders of the defined types were placed in this encounter.   Disposition:  Follow up one year after CT aorta thoracic  Signed, Shirlee More, MD  02/06/2019 4:09 PM    [facility]

## 2019-02-06 ENCOUNTER — Encounter: Payer: Self-pay | Admitting: Cardiology

## 2019-02-06 ENCOUNTER — Other Ambulatory Visit: Payer: Self-pay

## 2019-02-06 ENCOUNTER — Telehealth (INDEPENDENT_AMBULATORY_CARE_PROVIDER_SITE_OTHER): Payer: Medicare HMO | Admitting: Cardiology

## 2019-02-06 VITALS — Ht 68.0 in | Wt 156.6 lb

## 2019-02-06 DIAGNOSIS — I712 Thoracic aortic aneurysm, without rupture, unspecified: Secondary | ICD-10-CM

## 2019-02-06 DIAGNOSIS — I7789 Other specified disorders of arteries and arterioles: Secondary | ICD-10-CM

## 2019-02-06 DIAGNOSIS — E782 Mixed hyperlipidemia: Secondary | ICD-10-CM

## 2019-02-06 NOTE — Addendum Note (Signed)
Addended by: Austin Miles on: 02/06/2019 04:58 PM   Modules accepted: Orders

## 2019-02-06 NOTE — Patient Instructions (Addendum)
Medication Instructions:  Your physician recommends that you continue on your current medications as directed. Please refer to the Current Medication list given to you today.  If you need a refill on your cardiac medications before your next appointment, please call your pharmacy.   Lab work: None  If you have labs (blood work) drawn today and your tests are completely normal, you will receive your results only by: Marland Kitchen MyChart Message (if you have MyChart) OR . A paper copy in the mail If you have any lab test that is abnormal or we need to change your treatment, we will call you to review the results.  Testing/Procedures: Non-Cardiac CT scanning, (CAT scanning), is a noninvasive, special x-ray that produces cross-sectional images of the body using x-rays and a computer. CT scans help physicians diagnose and treat medical conditions. For some CT exams, a contrast material is used to enhance visibility in the area of the body being studied. CT scans provide greater clarity and reveal more details than regular x-ray exams. Your CT chest w/o contrast will be scheduled in 01/2020.   Follow-Up: At [facility], you and your health needs are our priority.  As part of our continuing mission to provide you with exceptional heart care, we have created designated Provider Care Teams.  These Care Teams include your primary Cardiologist (physician) and Advanced Practice Providers (APPs -  Physician Assistants and Nurse Practitioners) who all work together to provide you with the care you need, when you need it. You will need a follow up appointment in 1 years.

## 2019-03-31 DIAGNOSIS — N401 Enlarged prostate with lower urinary tract symptoms: Secondary | ICD-10-CM | POA: Diagnosis not present

## 2019-03-31 DIAGNOSIS — F329 Major depressive disorder, single episode, unspecified: Secondary | ICD-10-CM | POA: Diagnosis not present

## 2019-03-31 DIAGNOSIS — I77810 Thoracic aortic ectasia: Secondary | ICD-10-CM | POA: Diagnosis not present

## 2019-06-14 DIAGNOSIS — Z23 Encounter for immunization: Secondary | ICD-10-CM | POA: Diagnosis not present

## 2019-06-14 DIAGNOSIS — E7800 Pure hypercholesterolemia, unspecified: Secondary | ICD-10-CM | POA: Diagnosis not present

## 2019-06-14 DIAGNOSIS — R5383 Other fatigue: Secondary | ICD-10-CM | POA: Diagnosis not present

## 2019-06-14 DIAGNOSIS — Z125 Encounter for screening for malignant neoplasm of prostate: Secondary | ICD-10-CM | POA: Diagnosis not present

## 2019-06-14 DIAGNOSIS — Z79899 Other long term (current) drug therapy: Secondary | ICD-10-CM | POA: Diagnosis not present

## 2019-06-14 DIAGNOSIS — E559 Vitamin D deficiency, unspecified: Secondary | ICD-10-CM | POA: Diagnosis not present

## 2019-06-19 DIAGNOSIS — Z0000 Encounter for general adult medical examination without abnormal findings: Secondary | ICD-10-CM | POA: Diagnosis not present

## 2019-06-19 DIAGNOSIS — R5383 Other fatigue: Secondary | ICD-10-CM | POA: Diagnosis not present

## 2019-06-19 DIAGNOSIS — G4700 Insomnia, unspecified: Secondary | ICD-10-CM | POA: Diagnosis not present

## 2019-06-19 DIAGNOSIS — I77810 Thoracic aortic ectasia: Secondary | ICD-10-CM | POA: Diagnosis not present

## 2019-06-19 DIAGNOSIS — E7800 Pure hypercholesterolemia, unspecified: Secondary | ICD-10-CM | POA: Diagnosis not present

## 2019-07-10 DIAGNOSIS — E7800 Pure hypercholesterolemia, unspecified: Secondary | ICD-10-CM | POA: Diagnosis not present

## 2019-07-10 DIAGNOSIS — F329 Major depressive disorder, single episode, unspecified: Secondary | ICD-10-CM | POA: Diagnosis not present

## 2019-07-10 DIAGNOSIS — N401 Enlarged prostate with lower urinary tract symptoms: Secondary | ICD-10-CM | POA: Diagnosis not present

## 2019-09-05 DIAGNOSIS — F329 Major depressive disorder, single episode, unspecified: Secondary | ICD-10-CM | POA: Diagnosis not present

## 2019-09-05 DIAGNOSIS — N401 Enlarged prostate with lower urinary tract symptoms: Secondary | ICD-10-CM | POA: Diagnosis not present

## 2019-09-05 DIAGNOSIS — I639 Cerebral infarction, unspecified: Secondary | ICD-10-CM | POA: Diagnosis not present

## 2019-09-05 DIAGNOSIS — E7800 Pure hypercholesterolemia, unspecified: Secondary | ICD-10-CM | POA: Diagnosis not present

## 2019-09-07 ENCOUNTER — Emergency Department (HOSPITAL_COMMUNITY): Payer: Medicare HMO | Admitting: Registered Nurse

## 2019-09-07 ENCOUNTER — Emergency Department (HOSPITAL_COMMUNITY): Payer: Medicare HMO

## 2019-09-07 ENCOUNTER — Inpatient Hospital Stay (HOSPITAL_COMMUNITY)
Admission: EM | Admit: 2019-09-07 | Discharge: 2019-09-12 | DRG: 024 | Disposition: A | Discharge status: Home / Self Care | Payer: Medicare HMO | Attending: Neurology | Admitting: Neurology

## 2019-09-07 ENCOUNTER — Encounter (HOSPITAL_COMMUNITY)
Admission: EM | Disposition: A | Discharge status: Home / Self Care | Payer: Self-pay | Source: Home / Self Care | Attending: Neurology

## 2019-09-07 ENCOUNTER — Inpatient Hospital Stay (HOSPITAL_COMMUNITY): Payer: Medicare HMO

## 2019-09-07 DIAGNOSIS — R1310 Dysphagia, unspecified: Secondary | ICD-10-CM | POA: Diagnosis present

## 2019-09-07 DIAGNOSIS — Q2544 Congenital dilation of aorta: Secondary | ICD-10-CM | POA: Diagnosis not present

## 2019-09-07 DIAGNOSIS — I63312 Cerebral infarction due to thrombosis of left middle cerebral artery: Secondary | ICD-10-CM

## 2019-09-07 DIAGNOSIS — I6602 Occlusion and stenosis of left middle cerebral artery: Secondary | ICD-10-CM | POA: Diagnosis not present

## 2019-09-07 DIAGNOSIS — Q2549 Other congenital malformations of aorta: Secondary | ICD-10-CM

## 2019-09-07 DIAGNOSIS — G4700 Insomnia, unspecified: Secondary | ICD-10-CM | POA: Diagnosis present

## 2019-09-07 DIAGNOSIS — I051 Rheumatic mitral insufficiency: Secondary | ICD-10-CM | POA: Diagnosis not present

## 2019-09-07 DIAGNOSIS — R29712 NIHSS score 12: Secondary | ICD-10-CM | POA: Diagnosis present

## 2019-09-07 DIAGNOSIS — I639 Cerebral infarction, unspecified: Secondary | ICD-10-CM | POA: Diagnosis not present

## 2019-09-07 DIAGNOSIS — E7800 Pure hypercholesterolemia, unspecified: Secondary | ICD-10-CM | POA: Diagnosis not present

## 2019-09-07 DIAGNOSIS — R29810 Facial weakness: Secondary | ICD-10-CM | POA: Diagnosis present

## 2019-09-07 DIAGNOSIS — G8191 Hemiplegia, unspecified affecting right dominant side: Secondary | ICD-10-CM | POA: Diagnosis not present

## 2019-09-07 DIAGNOSIS — I2510 Atherosclerotic heart disease of native coronary artery without angina pectoris: Secondary | ICD-10-CM | POA: Diagnosis not present

## 2019-09-07 DIAGNOSIS — R4701 Aphasia: Secondary | ICD-10-CM | POA: Diagnosis present

## 2019-09-07 DIAGNOSIS — R29818 Other symptoms and signs involving the nervous system: Secondary | ICD-10-CM | POA: Diagnosis not present

## 2019-09-07 DIAGNOSIS — R471 Dysarthria and anarthria: Secondary | ICD-10-CM | POA: Diagnosis present

## 2019-09-07 DIAGNOSIS — Z882 Allergy status to sulfonamides status: Secondary | ICD-10-CM | POA: Diagnosis not present

## 2019-09-07 DIAGNOSIS — Z20822 Contact with and (suspected) exposure to covid-19: Secondary | ICD-10-CM | POA: Diagnosis not present

## 2019-09-07 DIAGNOSIS — I712 Thoracic aortic aneurysm, without rupture, unspecified: Secondary | ICD-10-CM | POA: Diagnosis present

## 2019-09-07 DIAGNOSIS — Z8249 Family history of ischemic heart disease and other diseases of the circulatory system: Secondary | ICD-10-CM | POA: Diagnosis not present

## 2019-09-07 DIAGNOSIS — I63412 Cerebral infarction due to embolism of left middle cerebral artery: Principal | ICD-10-CM | POA: Diagnosis present

## 2019-09-07 DIAGNOSIS — Z9889 Other specified postprocedural states: Secondary | ICD-10-CM | POA: Diagnosis not present

## 2019-09-07 DIAGNOSIS — I672 Cerebral atherosclerosis: Secondary | ICD-10-CM | POA: Diagnosis not present

## 2019-09-07 DIAGNOSIS — F319 Bipolar disorder, unspecified: Secondary | ICD-10-CM | POA: Diagnosis present

## 2019-09-07 DIAGNOSIS — I6389 Other cerebral infarction: Secondary | ICD-10-CM | POA: Diagnosis not present

## 2019-09-07 DIAGNOSIS — E785 Hyperlipidemia, unspecified: Secondary | ICD-10-CM | POA: Diagnosis not present

## 2019-09-07 DIAGNOSIS — I6521 Occlusion and stenosis of right carotid artery: Secondary | ICD-10-CM | POA: Diagnosis not present

## 2019-09-07 DIAGNOSIS — Z79899 Other long term (current) drug therapy: Secondary | ICD-10-CM | POA: Diagnosis not present

## 2019-09-07 DIAGNOSIS — R531 Weakness: Secondary | ICD-10-CM | POA: Diagnosis not present

## 2019-09-07 DIAGNOSIS — M6281 Muscle weakness (generalized): Secondary | ICD-10-CM | POA: Diagnosis not present

## 2019-09-07 DIAGNOSIS — E782 Mixed hyperlipidemia: Secondary | ICD-10-CM | POA: Diagnosis not present

## 2019-09-07 HISTORY — PX: RADIOLOGY WITH ANESTHESIA: SHX6223

## 2019-09-07 HISTORY — DX: Cerebral infarction, unspecified: I63.9

## 2019-09-07 HISTORY — PX: IR CT HEAD LTD: IMG2386

## 2019-09-07 HISTORY — PX: IR PERCUTANEOUS ART THROMBECTOMY/INFUSION INTRACRANIAL INC DIAG ANGIO: IMG6087

## 2019-09-07 LAB — CBG MONITORING, ED: Glucose-Capillary: 115 mg/dL — ABNORMAL HIGH (ref 70–99)

## 2019-09-07 LAB — CBC
HCT: 48.1 % (ref 39.0–52.0)
Hemoglobin: 15.4 g/dL (ref 13.0–17.0)
MCH: 30.6 pg (ref 26.0–34.0)
MCHC: 32 g/dL (ref 30.0–36.0)
MCV: 95.6 fL (ref 80.0–100.0)
Platelets: 184 10*3/uL (ref 150–400)
RBC: 5.03 MIL/uL (ref 4.22–5.81)
RDW: 12.7 % (ref 11.5–15.5)
WBC: 5.7 10*3/uL (ref 4.0–10.5)
nRBC: 0 % (ref 0.0–0.2)

## 2019-09-07 LAB — DIFFERENTIAL
Abs Immature Granulocytes: 0.01 10*3/uL (ref 0.00–0.07)
Basophils Absolute: 0 10*3/uL (ref 0.0–0.1)
Basophils Relative: 1 %
Eosinophils Absolute: 0.2 10*3/uL (ref 0.0–0.5)
Eosinophils Relative: 3 %
Immature Granulocytes: 0 %
Lymphocytes Relative: 34 %
Lymphs Abs: 2 10*3/uL (ref 0.7–4.0)
Monocytes Absolute: 0.5 10*3/uL (ref 0.1–1.0)
Monocytes Relative: 10 %
Neutro Abs: 3 10*3/uL (ref 1.7–7.7)
Neutrophils Relative %: 52 %

## 2019-09-07 LAB — COMPREHENSIVE METABOLIC PANEL
ALT: 28 U/L (ref 0–44)
AST: 22 U/L (ref 15–41)
Albumin: 3.8 g/dL (ref 3.5–5.0)
Alkaline Phosphatase: 39 U/L (ref 38–126)
Anion gap: 9 (ref 5–15)
BUN: 18 mg/dL (ref 8–23)
CO2: 22 mmol/L (ref 22–32)
Calcium: 8.7 mg/dL — ABNORMAL LOW (ref 8.9–10.3)
Chloride: 109 mmol/L (ref 98–111)
Creatinine, Ser: 1.03 mg/dL (ref 0.61–1.24)
GFR calc Af Amer: >60 mL/min (ref >60)
GFR calc non Af Amer: >60 mL/min (ref >60)
Glucose, Bld: 132 mg/dL — ABNORMAL HIGH (ref 70–99)
Potassium: 4 mmol/L (ref 3.5–5.1)
Sodium: 140 mmol/L (ref 135–145)
Total Bilirubin: 0.6 mg/dL (ref 0.3–1.2)
Total Protein: 6.1 g/dL — ABNORMAL LOW (ref 6.5–8.1)

## 2019-09-07 LAB — HIV ANTIBODY (ROUTINE TESTING W REFLEX): HIV Screen 4th Generation wRfx: NONREACTIVE

## 2019-09-07 LAB — I-STAT CHEM 8, ED
BUN: 20 mg/dL (ref 8–23)
Calcium, Ion: 1.12 mmol/L — ABNORMAL LOW (ref 1.15–1.40)
Chloride: 105 mmol/L (ref 98–111)
Creatinine, Ser: 1 mg/dL (ref 0.61–1.24)
Glucose, Bld: 125 mg/dL — ABNORMAL HIGH (ref 70–99)
HCT: 45 % (ref 39.0–52.0)
Hemoglobin: 15.3 g/dL (ref 13.0–17.0)
Potassium: 3.8 mmol/L (ref 3.5–5.1)
Sodium: 139 mmol/L (ref 135–145)
TCO2: 25 mmol/L (ref 22–32)

## 2019-09-07 LAB — PROTIME-INR
INR: 1 (ref 0.8–1.2)
Prothrombin Time: 12.7 seconds (ref 11.4–15.2)

## 2019-09-07 LAB — MRSA PCR SCREENING: MRSA by PCR: NEGATIVE

## 2019-09-07 LAB — APTT: aPTT: 26 seconds (ref 24–36)

## 2019-09-07 LAB — SARS CORONAVIRUS 2 BY RT PCR (HOSPITAL ORDER, PERFORMED IN ~~LOC~~ HOSPITAL LAB): SARS Coronavirus 2: NEGATIVE

## 2019-09-07 SURGERY — RADIOLOGY WITH ANESTHESIA
Anesthesia: General

## 2019-09-07 MED ORDER — TIROFIBAN HCL IN NACL 5-0.9 MG/100ML-% IV SOLN
INTRAVENOUS | Status: AC
  Filled 2019-09-07: qty 100

## 2019-09-07 MED ORDER — STROKE: EARLY STAGES OF RECOVERY BOOK: Freq: Once | Status: AC

## 2019-09-07 MED ORDER — ACETAMINOPHEN 325 MG PO TABS: 650.0000 mg | ORAL_TABLET | ORAL | Status: DC | PRN

## 2019-09-07 MED ORDER — SODIUM CHLORIDE 0.9 % IV SOLN: INTRAVENOUS | Status: DC

## 2019-09-07 MED ORDER — NITROGLYCERIN 1 MG/10 ML FOR IR/CATH LAB
INTRA_ARTERIAL | Status: DC | PRN
  Administered 2019-09-07 (×2): 25 ug via INTRA_ARTERIAL

## 2019-09-07 MED ORDER — SUGAMMADEX SODIUM 200 MG/2ML IV SOLN
INTRAVENOUS | Status: DC | PRN
  Administered 2019-09-07: 300 mg via INTRAVENOUS

## 2019-09-07 MED ORDER — SENNOSIDES-DOCUSATE SODIUM 8.6-50 MG PO TABS: 1.0000 | ORAL_TABLET | Freq: Every evening | ORAL | Status: DC | PRN

## 2019-09-07 MED ORDER — PHENYLEPHRINE HCL-NACL 10-0.9 MG/250ML-% IV SOLN
INTRAVENOUS | Status: DC | PRN
  Administered 2019-09-07: 40 ug/min via INTRAVENOUS

## 2019-09-07 MED ORDER — ALTEPLASE (STROKE) FULL DOSE INFUSION
0.9000 mg/kg | Freq: Once | INTRAVENOUS | Status: AC
  Administered 2019-09-07: 67.3 mg via INTRAVENOUS
  Filled 2019-09-07: qty 100

## 2019-09-07 MED ORDER — CLEVIDIPINE BUTYRATE 0.5 MG/ML IV EMUL: 0.0000 mg/h | INTRAVENOUS | Status: AC

## 2019-09-07 MED ORDER — TICAGRELOR 90 MG PO TABS
ORAL_TABLET | ORAL | Status: AC
  Filled 2019-09-07: qty 2

## 2019-09-07 MED ORDER — LIDOCAINE 2% (20 MG/ML) 5 ML SYRINGE
INTRAMUSCULAR | Status: DC | PRN
  Administered 2019-09-07: 40 mg via INTRAVENOUS

## 2019-09-07 MED ORDER — CEFAZOLIN SODIUM-DEXTROSE 2-4 GM/100ML-% IV SOLN
INTRAVENOUS | Status: AC
  Filled 2019-09-07: qty 100

## 2019-09-07 MED ORDER — LABETALOL HCL 5 MG/ML IV SOLN
20.0000 mg | Freq: Once | INTRAVENOUS | Status: DC
  Filled 2019-09-07: qty 4

## 2019-09-07 MED ORDER — CLEVIDIPINE BUTYRATE 0.5 MG/ML IV EMUL
0.0000 mg/h | INTRAVENOUS | Status: DC
  Filled 2019-09-07: qty 50

## 2019-09-07 MED ORDER — LABETALOL HCL 5 MG/ML IV SOLN
INTRAVENOUS | Status: DC | PRN
  Administered 2019-09-07 (×3): 5 mg via INTRAVENOUS

## 2019-09-07 MED ORDER — PANTOPRAZOLE SODIUM 40 MG IV SOLR
40.0000 mg | Freq: Every day | INTRAVENOUS | Status: DC
  Administered 2019-09-07 – 2019-09-08 (×2): 40 mg via INTRAVENOUS
  Filled 2019-09-07 (×2): qty 40

## 2019-09-07 MED ORDER — ESMOLOL HCL 100 MG/10ML IV SOLN
INTRAVENOUS | Status: DC | PRN
  Administered 2019-09-07: 20 ug via INTRAVENOUS
  Administered 2019-09-07 (×2): 40 ug via INTRAVENOUS

## 2019-09-07 MED ORDER — ASPIRIN 81 MG PO CHEW
CHEWABLE_TABLET | ORAL | Status: AC
  Filled 2019-09-07: qty 1

## 2019-09-07 MED ORDER — CEFAZOLIN SODIUM-DEXTROSE 2-3 GM-%(50ML) IV SOLR
INTRAVENOUS | Status: DC | PRN
  Administered 2019-09-07: 2 g via INTRAVENOUS

## 2019-09-07 MED ORDER — PHENYLEPHRINE 40 MCG/ML (10ML) SYRINGE FOR IV PUSH (FOR BLOOD PRESSURE SUPPORT)
PREFILLED_SYRINGE | INTRAVENOUS | Status: DC | PRN
  Administered 2019-09-07: 80 ug via INTRAVENOUS

## 2019-09-07 MED ORDER — CLOPIDOGREL BISULFATE 300 MG PO TABS
ORAL_TABLET | ORAL | Status: AC
  Filled 2019-09-07: qty 1

## 2019-09-07 MED ORDER — SODIUM CHLORIDE 0.9% FLUSH
3.0000 mL | Freq: Once | INTRAVENOUS | Status: DC
Start: 2019-09-07 — End: 2019-09-08

## 2019-09-07 MED ORDER — ONDANSETRON HCL 4 MG/2ML IJ SOLN: 4.0000 mg | Freq: Four times a day (QID) | INTRAMUSCULAR | Status: DC | PRN

## 2019-09-07 MED ORDER — SUCCINYLCHOLINE CHLORIDE 200 MG/10ML IV SOSY
PREFILLED_SYRINGE | INTRAVENOUS | Status: DC | PRN
  Administered 2019-09-07: 120 mg via INTRAVENOUS

## 2019-09-07 MED ORDER — NITROGLYCERIN 1 MG/10 ML FOR IR/CATH LAB
INTRA_ARTERIAL | Status: AC
  Filled 2019-09-07: qty 10

## 2019-09-07 MED ORDER — IOHEXOL 350 MG/ML SOLN
100.0000 mL | Freq: Once | INTRAVENOUS | Status: AC | PRN
  Administered 2019-09-07: 100 mL via INTRAVENOUS

## 2019-09-07 MED ORDER — ACETAMINOPHEN 650 MG RE SUPP: 650.0000 mg | RECTAL | Status: DC | PRN

## 2019-09-07 MED ORDER — ACETAMINOPHEN 160 MG/5ML PO SOLN: 650.0000 mg | ORAL | Status: DC | PRN

## 2019-09-07 MED ORDER — EPHEDRINE SULFATE-NACL 50-0.9 MG/10ML-% IV SOSY
PREFILLED_SYRINGE | INTRAVENOUS | Status: DC | PRN
  Administered 2019-09-07: 10 mg via INTRAVENOUS

## 2019-09-07 MED ORDER — ROCURONIUM BROMIDE 50 MG/5ML IV SOSY
PREFILLED_SYRINGE | INTRAVENOUS | Status: DC | PRN
  Administered 2019-09-07 (×2): 50 mg via INTRAVENOUS

## 2019-09-07 MED ORDER — EPTIFIBATIDE 20 MG/10ML IV SOLN
INTRAVENOUS | Status: AC
  Filled 2019-09-07: qty 10

## 2019-09-07 MED ORDER — SODIUM CHLORIDE 0.9 % IV SOLN
50.0000 mL | Freq: Once | INTRAVENOUS | Status: AC
  Administered 2019-09-07: 50 mL via INTRAVENOUS

## 2019-09-07 MED ORDER — IOHEXOL 300 MG/ML  SOLN
150.0000 mL | Freq: Once | INTRAMUSCULAR | Status: AC | PRN
  Administered 2019-09-07: 75 mL via INTRA_ARTERIAL

## 2019-09-07 MED ORDER — PROPOFOL 10 MG/ML IV BOLUS
INTRAVENOUS | Status: DC | PRN
  Administered 2019-09-07: 150 mg via INTRAVENOUS

## 2019-09-07 NOTE — Anesthesia Procedure Notes (Addendum)
Arterial Line Insertion Performed by: Roberts Gaudy, MD, Milford Cage, CRNA, CRNA  Preanesthetic checklist: patient identified, IV checked, site marked, risks and benefits discussed, surgical consent, monitors and equipment checked, pre-op evaluation, timeout performed and anesthesia consent Patient sedated Left, radial was placed Catheter size: 20 G Hand hygiene performed , maximum sterile barriers used  and Seldinger technique used Allen's test indicative of satisfactory collateral circulation Attempts: 1 Procedure performed without using ultrasound guided technique. Following insertion, Biopatch and dressing applied. Post procedure assessment: normal  Patient tolerated the procedure well with no immediate complications.

## 2019-09-07 NOTE — H&P (Signed)
Stroke neurology history and physical  CC: Right-sided facial droop, aphasia, right-sided weakness  History is obtained from: EMS, patient's girlfriend, chart  HPI: Bryce Logan is a 69 y.o. male who has a past medical history of hyperlipidemia, enlarged thoracic aorta which is stable as of February 06, 2019 from prior studies, presented via EMS for sudden onset of right-sided weakness and aphasia. According to the report, he was speaking to someone over the phone and had a sudden onset of complete cessation of speech.  The patient was spoken to, when results, found him to have a right facial droop, inability to talk and right-sided weakness.  EMS was called.  They evaluated the patient onsite.  Activated LVO positive code stroke.  Brought in the emergency room. We attempted to call son-number listed in chart-still waiting callback. Given the presentation that was consistent with a an acute stroke, stat head CT was performed-negative for bleed.  Aspects 10. IV TPA was given within 17 minutes of patient arrival. Further imaging with CT angio and perfusion was performed that showed a left MCA superior division proximal M2 occlusion.  CT perfusion study with a 24 cc core and 71 cc penumbra very favorable for intervention.  Discussed with girlfriend who provided consent. Dr. Estanislado Pandy currently in IR with the patient. Of note, he has a documented history of ICH in 2013 but no details other than the fact that there was an [place] at [facility].  Awaiting for family to provide more information.  CT did not show any sequela of old bleed hence TPA was deemed safe.  LKW: 1:10 PM tpa given?: no,  Premorbid modified Rankin scale (mRS): 0   ROS:  Unable to obtain due to altered mental status.   Past Medical History:  Diagnosis Date  . Chronic prostatitis    ELEVATED PSA . ED DR. DAHLSTEDT  . Coronary artery disease   . Depression   . Dizziness 11/11   DR. ROSEN, NORMAL EF, MILD LEAKINESS OF  MITRAL VALVE, MILD STIFFNESS OF HEART MUSCLE, MILD ENLARGEMENT OF AORTIC ROOT, REPEAT IN 1 YEAR DR. Radford Pax, NL ETT DR. Radford Pax 12/11  . H/O: GI bleed 2002   DR. HAYES   . Hyperlipidemia   . Insomnia    ON XANAX, DR. Sabra Heck  . Intracerebral hematoma ([place]) 2013   [place]  . Mild bipolar disorder ([place]) 01/2010   DR. PITTMAN DR. Toy Care, CHR DEPRESSION, FATIGUE OFF MEDS FOR AWHILE, CONTROLLED  . Tubular adenoma 09/17/11   [facility]    Family History  Problem Relation Age of Onset  . Cancer - Other Mother   . Hypertension Father   . Heart disease Father   . Melanoma Sister   . Alcoholism Son   . Alcohol abuse Son    Social History:   reports that he has never smoked. He has never used smokeless tobacco. He reports current alcohol use of about 2.0 - 6.0 standard drinks of alcohol per week. He reports previous drug use. Drug: Marijuana.  Medications  Current Facility-Administered Medications:  .  aspirin 81 MG chewable tablet, , , ,  .  clopidogrel (PLAVIX) 300 MG tablet, , , ,  .  eptifibatide (INTEGRILIN) 20 MG/10ML injection, , , ,  .  nitroGLYCERIN 100 mcg/mL intra-arterial injection, , , ,  .  sodium chloride flush (NS) 0.9 % injection 3 mL, 3 mL, Intravenous, Once, Fredia Sorrow, MD .  ticagrelor (BRILINTA) 90 MG tablet, , , ,  .  tirofiban (AGGRASTAT) 5-0.9 MG/100ML-% injection, , , ,   Current Outpatient Medications:  .  ALPRAZolam (XANAX) 0.5 MG tablet, Take 1 mg by mouth at bedtime. , Disp: , Rfl: 1 .  amphetamine-dextroamphetamine (ADDERALL) 10 MG tablet, Take 10 mg by mouth as directed. Takes 2.5mg  -5mg  daily as needed, Disp: , Rfl:  .  Ascorbic Acid (VITAMIN C) 1000 MG tablet, Take 1,000 mg by mouth daily., Disp: , Rfl:  .  Astaxanthin 4 MG CAPS, Take 1 capsule by mouth daily., Disp: , Rfl:  .  cetirizine (ZYRTEC) 10 MG tablet, Take 10 mg by mouth daily as needed for allergies., Disp: , Rfl:  .  Coenzyme Q10 (CO Q 10) 100 MG CAPS, Take 1  capsule by mouth daily., Disp: , Rfl:  .  KRILL OIL PO, Take 1 tablet by mouth daily. 1 - 2 grams daily, Disp: , Rfl:  .  Magnesium 300 MG CAPS, Take 300 mg by mouth daily. , Disp: , Rfl:  .  NON FORMULARY, LITHIUM OROTATE, Disp: , Rfl:  .  NON FORMULARY, Sam-e 1200mg  daily, Disp: , Rfl:  .  Red Yeast Rice Extract (RED YEAST RICE PO), Take 1 tablet by mouth 2 (two) times a day., Disp: , Rfl:  .  zolpidem (AMBIEN) 10 MG tablet, Take 10 mg by mouth at bedtime as needed for sleep. Reports breaking tablet into 1/3 tablet, Disp: , Rfl:    Exam: Current vital signs: BP 122/85   Wt 74.8 kg   BMI 25.07 kg/m  Vital signs in last 24 hours: BP: (122)/(85) 122/85 (01/07 1430) Weight:  [74.8 kg] 74.8 kg (01/07 1400) General: Awake alert in no distress HEENT: Normocephalic atraumatic Lung: Clear to auscultation Cardiovascular: Regular rate rhythm Abdomen: Soft nondistended nontender Extremities warm well perfused Neurological exam Awake alert Completely expressively aphasic-mute. Was able to follow simple commands like closing eyes but not able to follow commands like showing a fist or showing 2 fingers. Cranial nerves: Pupils are equal round reactive light, there is no restriction of extraocular movements, visual fields appear full, there is full facial weakness on the right side including upper and lower face, tongue and palate appear midline. Motor exam: There is mild drift of the right upper extremity, no drift otherwise. Sensory exam: Difficult to ascertain any deficit but grimaces to noxious stimulation in all 4 limbs. Coordination also difficult to assess but no gross dysmetria noted on reach for objects. NIH stroke scale-12 on arrival, performed at the bridge.  Labs I have reviewed labs in epic and the results pertinent to this consultation are:  CBC    Component Value Date/Time   WBC 5.7 09/07/2019 1416   RBC 5.03 09/07/2019 1416   HGB 15.3 09/07/2019 1445   HCT 45.0 09/07/2019  1445   PLT 184 09/07/2019 1416   MCV 95.6 09/07/2019 1416   MCH 30.6 09/07/2019 1416   MCHC 32.0 09/07/2019 1416   RDW 12.7 09/07/2019 1416   LYMPHSABS 2.0 09/07/2019 1416   MONOABS 0.5 09/07/2019 1416   EOSABS 0.2 09/07/2019 1416   BASOSABS 0.0 09/07/2019 1416    CMP     Component Value Date/Time   NA 139 09/07/2019 1445   K 3.8 09/07/2019 1445   CL 105 09/07/2019 1445   CO2 22 09/07/2019 1416   GLUCOSE 125 (H) 09/07/2019 1445   BUN 20 09/07/2019 1445   CREATININE 1.00 09/07/2019 1445   CALCIUM 8.7 (L) 09/07/2019 1416   PROT 6.1 (L) 09/07/2019 1416  ALBUMIN 3.8 09/07/2019 1416   AST 22 09/07/2019 1416   ALT 28 09/07/2019 1416   ALKPHOS 39 09/07/2019 1416   BILITOT 0.6 09/07/2019 1416   GFRNONAA >60 09/07/2019 1416   GFRAA >60 09/07/2019 1416    Imaging I have reviewed the images obtained:  CT-scan of the brain-aspects 10.  No bleed CTA-left MCA proximal M2 occlusion.  CT perfusion cord 24 cc) number 71 cc corresponding to the left proximal M2 occlusion.   Assessment:  69 year old past history of hyperlipidemia, enlarged thoracic aorta, presented for sudden onset of right-sided weakness and aphasia. Exam consistent with a large vessel occlusion involving the left cerebral hemisphere. Noncontrast head CT negative for bleed.  tPA given. Vascular imaging suggestive of a left M2 proximal occlusion.  Case discussed with Dr. Estanislado Pandy and IR activated right as patient was being wheeled into the CT scan from the bridge based on clinical exam. Consent for thrombectomy obtained from girlfriend who was onsite. Attempted to call son-left voicemail with my personal cell phone number-still awaiting call.  Plan:  Acute Ischemic Stroke Cerebral infarction due to embolism of left middle cerebral artery   Acuity: Acute Current Suspected Etiology: Continue Evaluation:  -Admit to: Neurological ICU -Hold Aspirin until 24 hour post tPA neuroimaging is stable and without evidence  of bleeding -Blood pressure control, goal of SYS <180 after TPA.  If revascularization is achieved, blood pressure goal per endovascular. -MRI/ECHO/A1C/Lipid panel. -Hyperglycemia management per SSI to maintain glucose 140-180mg /dL. -PT/OT/ST therapies and recommendations when able  CNS -Close neuro monitoring  Dysarthria Dysphagia following cerebral infarction  -NPO until cleared by speech -ST -Advance diet as tolerated -May need PEG  Hemiplegia and hemiparesis following cerebral infarction affecting right dominant side -PT/OT -PM&R consult  RESP Intubated for procedure.    -vent management per ICU if remains intubated -wean when able -Chest x-ray  CV Enlarged thoracic aorta. Management per outpatient cardiology No acute issues at this time. -TTE -Obtain twelve-lead EKG  Hyperlipidemia, unspecified  - Statin for goal LDL < 70  HEME No active issues -Monitor -transfuse for hgb < 7  ENDO -goal HgbA1c < 7  GI/GU -Gentle hydration  Fluid/Electrolyte Disorders -Repeat labs -Replete electrolytes as necessary  ID Possible Aspiration PNA -CXR -NPO -Monitor   Prophylaxis DVT: SCD GI: PPI Bowel: Docusate senna  Diet: NPO until cleared by speech  Code Status: Full Code    THE FOLLOWING WERE PRESENT ON ADMISSION: Acute ischemic stroke, hemiparesis or hemiplegia Possible aspiration pneumonia  -- Amie Portland, MD [facility] Pager: [phone] If 7pm to 7am, please call on call as listed on AMION.  CRITICAL CARE ATTESTATION Performed by: Amie Portland, MD Total critical care time: 22minutes Critical care time was exclusive of separately billable procedures and treating other patients and/or supervising APPs/Residents/Students Critical care was necessary to treat or prevent imminent or life-threatening deterioration due to acute ischemic stroke, IV thrombolysis, evaluation for emergent cerebral angiogram and possible mechanical  thrombectomy This patient is critically ill and at significant risk for neurological worsening and/or death and care requires constant monitoring. Critical care was time spent personally by me on the following activities: development of treatment plan with patient and/or surrogate as well as nursing, discussions with consultants, evaluation of patient's response to treatment, examination of patient, obtaining history from patient or surrogate, ordering and performing treatments and interventions, ordering and review of laboratory studies, ordering and review of radiographic studies, pulse oximetry, re-evaluation of patient's condition, participation in multidisciplinary rounds and medical decision making  of high complexity in the care of this patient.

## 2019-09-07 NOTE — Code Documentation (Signed)
Stroke Response Nurse Documentation Code Documentation  Bryce Logan. is a 69 y.o. male arriving to [place]. [facility] ED via [facility] on 09/07/2019. Code stroke was activated by EMS. Patient from home where he was LKW at 1310 and now complaining of inability to speak and right sided weakness. Pt was speaking to the girlfriend on the phone when he had a sudden onset of inability to speak. Girlfriend called EMS. Stroke team at the bedside on patient arrival. Labs drawn and patient cleared for CT by Dr. Darl Householder. Patient to CT with team. NIHSS 12, see documentation for details and code stroke times. Patient with disoriented, not following commands, right facial droop, right arm weakness and Expressive aphasia  on exam. The following imaging was completed: CT, CTA head and neck, CTP. Patient is a candidate for tPA and tPA started at 1434. See pharmacy note for details. CTA completed and patient taken to IR for acute treatment. Arrived to [place] [facility] at 1436. Bedside handoff with ED RN Jacob Moores.    Kathrin Greathouse  Stroke Response RN

## 2019-09-07 NOTE — Transfer of Care (Signed)
Immediate Anesthesia Transfer of Care Note  Patient: Bryce Logan.  Procedure(s) Performed: RADIOLOGY WITH ANESTHESIA (N/A )  Patient Location: ICU  Anesthesia Type:General  Level of Consciousness: awake  Airway & Oxygen Therapy: Patient Spontanous Breathing and Patient connected to nasal cannula oxygen  Post-op Assessment: Report given to RN, Post -op Vital signs reviewed and stable and Patient moving all extremities X 4  Post vital signs: Reviewed and stable  Last Vitals:  Vitals Value Taken Time  BP    Temp    Pulse 70 09/07/19 1652  Resp 14 09/07/19 1652  SpO2 100 % 09/07/19 1652  Vitals shown include unvalidated device data.  Last Pain: There were no vitals filed for this visit.       Complications: No apparent anesthesia complications

## 2019-09-07 NOTE — ED Provider Notes (Signed)
[facility] Provider Note   CSN: [id number] Arrival date & time: 09/07/19  1415     History No chief complaint on file.   Bryce Logan is a 69 y.o. male.  Patient arrived as a code stroke.  Bryce Logan is a 69 year old past medical history is never hyperlipidemia known to have an enlarged thoracic aorta.  Been stable since recent evaluation in June 2020.  Comes by EMS sudden onset of right-sided weakness and aphasia.  According to the reports that we got from yesterday speaking to someone over the phone had sudden onset of complete cessation of speech.  He was found to have right facial droop inability to talk and right-sided weakness.  Activated LVO code stroke upon arrival.  Patient immediately went to head CT.        Past Medical History:  Diagnosis Date  . Chronic prostatitis    ELEVATED PSA . ED DR. DAHLSTEDT  . Coronary artery disease   . Depression   . Dizziness 11/11   DR. ROSEN, NORMAL EF, MILD LEAKINESS OF MITRAL VALVE, MILD STIFFNESS OF HEART MUSCLE, MILD ENLARGEMENT OF AORTIC ROOT, REPEAT IN 1 YEAR DR. Radford Pax, NL ETT DR. Radford Pax 12/11  . H/O: GI bleed 2002   DR. HAYES   . Hyperlipidemia   . Insomnia    ON XANAX, DR. Sabra Heck  . Intracerebral hematoma ([place]) 2013   [place]  . Mild bipolar disorder ([place]) 01/2010   DR. PITTMAN DR. Toy Care, CHR DEPRESSION, FATIGUE OFF MEDS FOR AWHILE, CONTROLLED  . Tubular adenoma 09/17/11   [facility]    Patient Active Problem List   Diagnosis Date Noted  . Dyspnea 02/03/2019  . Chest pain 02/03/2019  . CAD (coronary artery disease) 02/03/2019  . Thoracic aortic aneurysm ([place]) 02/03/2019  . Hyperlipidemia 02/03/2019    Past Surgical History:  Procedure Laterality Date  . COLONOSCOPY  2002  . LEFT KNEE ATHROSCOPY  2002  . LEFT LEG AMBULATORY PHLEBECTOMY  2002       Family History  Problem Relation Age of Onset  . Cancer - Other Mother   . Hypertension Father    . Heart disease Father   . Melanoma Sister   . Alcoholism Son   . Alcohol abuse Son     Social History   Tobacco Use  . Smoking status: Never Smoker  . Smokeless tobacco: Never Used  Substance Use Topics  . Alcohol use: Yes    Alcohol/week: 2.0 - 6.0 standard drinks    Types: 2 - 6 Glasses of wine per week    Comment: 2-6 PER WEEK  . Drug use: Not Currently    Types: Marijuana    Home Medications Prior to Admission medications   Medication Sig Start Date End Date Taking? Authorizing Provider  ALPRAZolam Duanne Moron) 0.5 MG tablet Take 1 mg by mouth at bedtime.  09/21/16   [provider]  amphetamine-dextroamphetamine (ADDERALL) 10 MG tablet Take 10 mg by mouth as directed. Takes 2.5mg  -5mg  daily as needed    [provider]  Ascorbic Acid (VITAMIN C) 1000 MG tablet Take 1,000 mg by mouth daily.    [provider]  Astaxanthin 4 MG CAPS Take 1 capsule by mouth daily.    [provider]  cetirizine (ZYRTEC) 10 MG tablet Take 10 mg by mouth daily as needed for allergies.    [provider]  Coenzyme Q10 (CO Q 10) 100 MG CAPS Take 1 capsule  by mouth daily.    [provider]  KRILL OIL PO Take 1 tablet by mouth daily. 1 - 2 grams daily    [provider]  Magnesium 300 MG CAPS Take 300 mg by mouth daily.     [provider]  NON FORMULARY LITHIUM OROTATE    [provider]  NON FORMULARY Sam-e 1200mg  daily    [provider]  Red Yeast Rice Extract (RED YEAST RICE PO) Take 1 tablet by mouth 2 (two) times a day.    [provider]  zolpidem (AMBIEN) 10 MG tablet Take 10 mg by mouth at bedtime as needed for sleep. Reports breaking tablet into 1/3 tablet    [provider]    Allergies    Sulfa antibiotics  Review of Systems   Review of Systems  Unable to perform ROS: Patient nonverbal    Physical Exam Updated Vital Signs Wt 74.8 kg   BMI 25.07 kg/m   Physical Exam  Vitals and nursing note reviewed.  Constitutional:      Appearance: He is well-developed.  HENT:     Head: Normocephalic and atraumatic.  Eyes:     Extraocular Movements: Extraocular movements intact.     Conjunctiva/sclera: Conjunctivae normal.     Pupils: Pupils are equal, round, and reactive to light.  Cardiovascular:     Rate and Rhythm: Normal rate and regular rhythm.     Heart sounds: No murmur.  Pulmonary:     Effort: Pulmonary effort is normal. No respiratory distress.     Breath sounds: Normal breath sounds.  Abdominal:     Palpations: Abdomen is soft.     Tenderness: There is no abdominal tenderness.  Musculoskeletal:     Cervical back: Normal range of motion and neck supple.  Skin:    General: Skin is warm and dry.  Neurological:     Mental Status: He is alert.     Comments: Patient was able to follow simple commands like closing eyes but not able to follow commands like showing a fist or showing 2 fingers.  Pupils as noted were equal round and reactive to light.  There is no restriction of extraocular muse movements.  Visual fields grossly seem to be intact.  Had a full facial weakness on the right side including upper and lower face.  On motor exam there was mild drift of the right upper extremity no drift otherwise.  Sensory cannot be ascertained.  Coordination also difficult to ascertain.     ED Results / Procedures / Treatments   Labs (all labs ordered are listed, but only abnormal results are displayed) Labs Reviewed  I-STAT CHEM 8, ED - Abnormal; Notable for the following components:      Result Value   Glucose, Bld 125 (*)    Calcium, Ion 1.12 (*)    All other components within normal limits  CBG MONITORING, ED - Abnormal; Notable for the following components:   Glucose-Capillary 115 (*)    All other components within normal limits  RESPIRATORY PANEL BY RT PCR (FLU A&B, COVID)  CBC  DIFFERENTIAL  PROTIME-INR  APTT  COMPREHENSIVE METABOLIC PANEL     EKG None  Radiology No results found.  Procedures Procedures (including critical care time)   CRITICAL CARE Performed by: Fredia Sorrow Total critical care time: 30 minutes Critical care time was exclusive of separately billable procedures and treating other patients. Critical care was necessary to treat or prevent imminent or life-threatening deterioration. Critical  care was time spent personally by me on the following activities: development of treatment plan with patient and/or surrogate as well as nursing, discussions with consultants, evaluation of patient's response to treatment, examination of patient, obtaining history from patient or surrogate, ordering and performing treatments and interventions, ordering and review of laboratory studies, ordering and review of radiographic studies, pulse oximetry and re-evaluation of patient's condition.    Medications Ordered in ED Medications  sodium chloride flush (NS) 0.9 % injection 3 mL (has no administration in time range)  tirofiban (AGGRASTAT) 5-0.9 MG/100ML-% injection (has no administration in time range)  aspirin 81 MG chewable tablet (has no administration in time range)  ticagrelor (BRILINTA) 90 MG tablet (has no administration in time range)  clopidogrel (PLAVIX) 300 MG tablet (has no administration in time range)  eptifibatide (INTEGRILIN) 20 MG/10ML injection (has no administration in time range)  nitroGLYCERIN 100 mcg/mL intra-arterial injection (has no administration in time range)  iohexol (OMNIPAQUE) 350 MG/ML injection 100 mL (has no administration in time range)  ceFAZolin (ANCEF) 2-4 GM/100ML-% IVPB (has no administration in time range)  alteplase (ACTIVASE) 1 mg/mL infusion 67.3 mg (has no administration in time range)    Followed by  0.9 %  sodium chloride infusion (has no administration in time range)    ED Course  I have reviewed the triage vital signs and the nursing notes.  Pertinent labs & imaging  results that were available during my care of the patient were reviewed by me and considered in my medical decision making (see chart for details).  Clinical Course as of Sep 06 1452  Thu Sep 07, 2019  1444 CT Code Stroke Cerebral Perfusion with contrast [KF]    Clinical Course User Index [KF] Warren Danes   MDM Rules/Calculators/A&P                      Patient code stroke.  CT head without bleed.  tPA was started in the CT scanner.  Patient is NIH score was approximately 12.  TPA was started 17 minutes after patient's arrival.  Further imaging with CT angio and perfusion was confirmed to show a left MCA M2 occlusion.  Neuro hospitalist got consent from the patient's girlfriend.  Patient turned over to interventional radiology Dr. Marcelino Scot RRR.  Patient taken to IR.      Final Clinical Impression(s) / ED Diagnoses Final diagnoses:  None    Rx / DC Orders ED Discharge Orders    None       Fredia Sorrow, MD 09/07/19 1655

## 2019-09-07 NOTE — Anesthesia Procedure Notes (Addendum)
Procedure Name: Intubation Performed by: Milford Cage, CRNA Pre-anesthesia Checklist: Patient identified, Emergency Drugs available, Suction available, Patient being monitored and Timeout performed Patient Re-evaluated:Patient Re-evaluated prior to induction Oxygen Delivery Method: Ambu bag Preoxygenation: Pre-oxygenation with 100% oxygen Induction Type: IV induction and Rapid sequence Laryngoscope Size: Miller and 2 Grade View: Grade I Tube type: Oral Tube size: 7.5 mm Number of attempts: 1 Airway Equipment and Method: Stylet Placement Confirmation: ETT inserted through vocal cords under direct vision,  positive ETCO2,  breath sounds checked- equal and bilateral and CO2 detector Secured at: 23 cm Tube secured with: Tape Dental Injury: Teeth and Oropharynx as per pre-operative assessment

## 2019-09-07 NOTE — Progress Notes (Signed)
Pharmacist Code Stroke Response  Notified to mix tPA at 1429 Delivered tPA to RN at 1433  tPA dose = 6.7 mg bolus over 1 minute followed by 60.6 mg for a total dose of 67.3 mg over 1 hour  Issues/delays encountered (if applicable): N/A  Harvel Quale 09/07/19 2:35 PM

## 2019-09-07 NOTE — Progress Notes (Signed)
Patient ID: Bryce Logan., male   DOB: May 30, 1951, 69 y.o.   MRN: [id number] INR. 68Y RT H M mrss 0  New onset RT sided hemiparesis and aphasia  CT brain NO ICH ASPECTS 10  CTA occluded sup division LT MCA CTP core vol 24 ml with a penumbra of 47 ml . Endovascular treatment of occluded LT MCA branch D/W girlfriend. Procedure,reasons risks reviewed. Risks of ICH of 10 % with worsening neuro function death ,inability to revascularize discussed.She expressed understanding and provided consent to proceed. S.Babacar Haycraft MD  Post procedure CT brain neg for hemorrhage or mass effect.Extubated . Moves all 4s Lt > RT  Difficulty with comprehension. RT groin soft . Hemostasis with an 59F angioseal. Distal pulses Palpable DP and PT bilaterally. S.Archie Shea MD

## 2019-09-07 NOTE — Procedures (Signed)
S/P LT common carotid arteriogram followed by complete revascularization of occluded sup division M2 seg of LT MCA  With x 1 pass with 55mm x 33 mm trevop[rovue retriever achieving a TICI 3 revascularization. S.Tinsleigh Slovacek MD

## 2019-09-07 NOTE — Anesthesia Postprocedure Evaluation (Signed)
Anesthesia Post Note  Patient: Bryce Logan.  Procedure(s) Performed: RADIOLOGY WITH ANESTHESIA (N/A )     Patient location during evaluation: NICU Anesthesia Type: General Level of consciousness: awake and patient cooperative Pain management: pain level controlled Vital Signs Assessment: post-procedure vital signs reviewed and stable Respiratory status: spontaneous breathing, nonlabored ventilation, respiratory function stable and patient connected to nasal cannula oxygen Cardiovascular status: blood pressure returned to baseline and stable Postop Assessment: no apparent nausea or vomiting Anesthetic complications: no    Last Vitals:  Vitals:   09/07/19 1645 09/07/19 1700  BP:  123/85  Pulse: 71 66  Resp: 18 15  Temp:  (!) 35.7 C  SpO2: 100% 100%    Last Pain:  Vitals:   09/07/19 1700  TempSrc: Axillary  PainSc: 0-No pain                 Sharod Petsch COKER

## 2019-09-07 NOTE — Progress Notes (Signed)
Report given to 4N RN groin, pulses assessed at bedside

## 2019-09-07 NOTE — Anesthesia Preprocedure Evaluation (Signed)
Anesthesia Evaluation  Patient identified by MRN, date of birth, ID band Patient unresponsive    Reviewed: NPO status , Patient's Chart, lab work & pertinent test results  Airway Mallampati: II       Dental   Pulmonary    breath sounds clear to auscultation       Cardiovascular  Rhythm:Regular Rate:Normal     Neuro/Psych    GI/Hepatic   Endo/Other    Renal/GU      Musculoskeletal   Abdominal   Peds  Hematology   Anesthesia Other Findings   Reproductive/Obstetrics                             Anesthesia Physical Anesthesia Plan  ASA: IV and emergent  Anesthesia Plan: General   Post-op Pain Management:    Induction: Intravenous, Cricoid pressure planned and Rapid sequence  PONV Risk Score and Plan: Ondansetron and Dexamethasone  Airway Management Planned: Oral ETT  Additional Equipment: Arterial line  Intra-op Plan:   Post-operative Plan: Post-operative intubation/ventilation  Informed Consent: I have reviewed the patients History and Physical, chart, labs and discussed the procedure including the risks, benefits and alternatives for the proposed anesthesia with the patient or authorized representative who has indicated his/her understanding and acceptance.     Dental advisory given  Plan Discussed with: CRNA and Anesthesiologist  Anesthesia Plan Comments:         Anesthesia Quick Evaluation

## 2019-09-08 ENCOUNTER — Inpatient Hospital Stay (HOSPITAL_COMMUNITY): Payer: Medicare HMO

## 2019-09-08 DIAGNOSIS — R4701 Aphasia: Secondary | ICD-10-CM | POA: Diagnosis present

## 2019-09-08 DIAGNOSIS — I6389 Other cerebral infarction: Secondary | ICD-10-CM

## 2019-09-08 LAB — CBC WITH DIFFERENTIAL/PLATELET
Abs Immature Granulocytes: 0.03 10*3/uL (ref 0.00–0.07)
Basophils Absolute: 0 10*3/uL (ref 0.0–0.1)
Basophils Relative: 0 %
Eosinophils Absolute: 0.1 10*3/uL (ref 0.0–0.5)
Eosinophils Relative: 1 %
HCT: 42.1 % (ref 39.0–52.0)
Hemoglobin: 13.7 g/dL (ref 13.0–17.0)
Immature Granulocytes: 0 %
Lymphocytes Relative: 13 %
Lymphs Abs: 1.1 10*3/uL (ref 0.7–4.0)
MCH: 30.9 pg (ref 26.0–34.0)
MCHC: 32.5 g/dL (ref 30.0–36.0)
MCV: 95 fL (ref 80.0–100.0)
Monocytes Absolute: 0.7 10*3/uL (ref 0.1–1.0)
Monocytes Relative: 9 %
Neutro Abs: 6.2 10*3/uL (ref 1.7–7.7)
Neutrophils Relative %: 77 %
Platelets: 167 10*3/uL (ref 150–400)
RBC: 4.43 MIL/uL (ref 4.22–5.81)
RDW: 12.7 % (ref 11.5–15.5)
WBC: 8.1 10*3/uL (ref 4.0–10.5)
nRBC: 0 % (ref 0.0–0.2)

## 2019-09-08 LAB — LIPID PANEL
Cholesterol: 158 mg/dL (ref 0–200)
HDL: 57 mg/dL (ref >40)
LDL Cholesterol: 88 mg/dL (ref 0–99)
Total CHOL/HDL Ratio: 2.8 RATIO
Triglycerides: 66 mg/dL (ref <150)
VLDL: 13 mg/dL (ref 0–40)

## 2019-09-08 LAB — BASIC METABOLIC PANEL
Anion gap: 10 (ref 5–15)
BUN: 10 mg/dL (ref 8–23)
CO2: 21 mmol/L — ABNORMAL LOW (ref 22–32)
Calcium: 7.9 mg/dL — ABNORMAL LOW (ref 8.9–10.3)
Chloride: 106 mmol/L (ref 98–111)
Creatinine, Ser: 0.86 mg/dL (ref 0.61–1.24)
GFR calc Af Amer: >60 mL/min (ref >60)
GFR calc non Af Amer: >60 mL/min (ref >60)
Glucose, Bld: 102 mg/dL — ABNORMAL HIGH (ref 70–99)
Potassium: 3.5 mmol/L (ref 3.5–5.1)
Sodium: 137 mmol/L (ref 135–145)

## 2019-09-08 LAB — ECHOCARDIOGRAM COMPLETE BUBBLE STUDY: Weight: 2638.47 oz

## 2019-09-08 LAB — HEMOGLOBIN A1C
Hgb A1c MFr Bld: 5.4 % (ref 4.8–5.6)
Mean Plasma Glucose: 108.28 mg/dL

## 2019-09-08 MED ORDER — CHLORHEXIDINE GLUCONATE CLOTH 2 % EX PADS
6.0000 | MEDICATED_PAD | Freq: Every day | CUTANEOUS | Status: DC
  Administered 2019-09-08 – 2019-09-10 (×3): 6 via TOPICAL

## 2019-09-08 MED ORDER — SODIUM CHLORIDE 0.9% FLUSH
10.0000 mL | Freq: Once | INTRAVENOUS | Status: AC
  Administered 2019-09-08: 10 mL via INTRAVENOUS

## 2019-09-08 MED ORDER — ATORVASTATIN CALCIUM 10 MG PO TABS
20.0000 mg | ORAL_TABLET | Freq: Every day | ORAL | Status: DC
  Administered 2019-09-08: 20 mg via ORAL
  Filled 2019-09-08: qty 2

## 2019-09-08 MED ORDER — ALPRAZOLAM 0.5 MG PO TABS
0.5000 mg | ORAL_TABLET | Freq: Every evening | ORAL | Status: DC | PRN
  Administered 2019-09-08 – 2019-09-11 (×4): 0.5 mg via ORAL
  Filled 2019-09-08 (×4): qty 1

## 2019-09-08 MED ORDER — ASPIRIN EC 81 MG PO TBEC
81.0000 mg | DELAYED_RELEASE_TABLET | Freq: Every day | ORAL | Status: DC
  Administered 2019-09-08 – 2019-09-12 (×5): 81 mg via ORAL
  Filled 2019-09-08 (×5): qty 1

## 2019-09-08 NOTE — Evaluation (Signed)
Physical Therapy Evaluation Patient Details Name: Bryce Logan. MRN: [id number] DOB: 1951-08-29 Today's Date: 09/08/2019   History of Present Illness  Bryce Logan is a 69 y.o. male who has a past medical history of hyperlipidemia, enlarged thoracic aorta which is stable as of February 06, 2019 from prior studies, presented via EMS for sudden onset of right-sided weakness and aphasia.  Pt now s/p tPA and successful thrombectomy.   Clinical Impression  Pt admitted for above. He has deficits in balance and mobility. He completed coordination assessment of B UE and LE without difficulty. He was able to complete bed mobility and transfers mod I, as well as most of gait and balance. He did have one episode of veering to R as he was about to leave room and needed cueing to continue straight into hallway. After this, gait WFL. He denied dizziness with DGI activities. He completed stairs with min guard for safety. Benefits pt to ask yes/no questions due to expressive aphasia. No PT following acute discharge is recommended at this time. He would benefit from continued skilled acute PT in order to maximize safety prior to discharge.     Follow Up Recommendations No PT follow up    Equipment Recommendations  None recommended by PT    Recommendations for Other Services       Precautions / Restrictions Precautions Precautions: None Restrictions Weight Bearing Restrictions: No      Mobility  Bed Mobility Overal bed mobility: Modified Independent             General bed mobility comments: able to reach EOB with no assistance  Transfers Overall transfer level: Modified independent Equipment used: None             General transfer comment: stood without LOB or unsteadiness  Ambulation/Gait Ambulation/Gait assistance: Min guard;Modified independent (Device/Increase time) Gait Distance (Feet): 400 Feet Assistive device: None Gait Pattern/deviations: Decreased stride length Gait velocity:  WFL   General Gait Details: pt initially min guard as he was veering toward the R when exiting his room, but became mod I after a few more steps; able to complete dynamic gait tasks without LOB or unsteadiness  Stairs Stairs: Yes Stairs assistance: Supervision Stair Management: Forwards Number of Stairs: 4 General stair comments: able to navigate 4 stairs well; min guard for safety but no LOB; limited by IV pole rather than ability  Wheelchair Mobility    Modified Rankin (Stroke Patients Only)       Balance Overall balance assessment: Modified Independent                               Standardized Balance Assessment Standardized Balance Assessment : Dynamic Gait Index   Dynamic Gait Index Level Surface: Normal Change in Gait Speed: Normal Gait with Horizontal Head Turns: Normal Gait with Vertical Head Turns: Normal Gait and Pivot Turn: Mild Impairment Step Over Obstacle: Normal Step Around Obstacles: Normal Steps: Normal Total Score: 23       Pertinent Vitals/Pain Pain Assessment: Faces Faces Pain Scale: No hurt    Home Living Family/patient expects to be discharged to:: Private residence Living Arrangements: Alone Available Help at Discharge: Other (Comment)(girlfriend available daily to assist pt if necessary) Type of Home: House Home Access: Stairs to enter Entrance Stairs-Rails: None Entrance Stairs-Number of Steps: 2 Home Layout: One level Home Equipment: None      Prior Function Level of Independence: Independent  Hand Dominance   Dominant Hand: Right    Extremity/Trunk Assessment   Upper Extremity Assessment Upper Extremity Assessment: Overall WFL for tasks assessed    Lower Extremity Assessment Lower Extremity Assessment: Overall WFL for tasks assessed       Communication   Communication: Expressive difficulties(difficulty answering open-ended questions; can write; yes/no)  Cognition Arousal/Alertness:  Awake/alert Behavior During Therapy: WFL for tasks assessed/performed Overall Cognitive Status: Within Functional Limits for tasks assessed                                        General Comments      Exercises     Assessment/Plan    PT Assessment Patient needs continued PT services  PT Problem List Decreased balance;Decreased mobility       PT Treatment Interventions Gait training;Stair training;Functional mobility training;Therapeutic activities;Therapeutic exercise;Balance training;Patient/family education;Neuromuscular re-education    PT Goals (Current goals can be found in the Care Plan section)  Acute Rehab PT Goals Patient Stated Goal: unable to state PT Goal Formulation: With patient Time For Goal Achievement: 09/22/19 Potential to Achieve Goals: Good    Frequency Min 4X/week   Barriers to discharge        Co-evaluation               AM-PAC PT "6 Clicks" Mobility  Outcome Measure Help needed turning from your back to your side while in a flat bed without using bedrails?: None Help needed moving from lying on your back to sitting on the side of a flat bed without using bedrails?: None Help needed moving to and from a bed to a chair (including a wheelchair)?: None Help needed standing up from a chair using your arms (e.g., wheelchair or bedside chair)?: None Help needed to walk in hospital room?: A Little Help needed climbing 3-5 steps with a railing? : A Little 6 Click Score: 22    End of Session Equipment Utilized During Treatment: Gait belt Activity Tolerance: Patient tolerated treatment well Patient left: in chair;with chair alarm set;with call bell/phone within reach Nurse Communication: Mobility status PT Visit Diagnosis: Other symptoms and signs involving the nervous system (R29.898)    Time: 1420-1443 PT Time Calculation (min) (ACUTE ONLY): 23 min   Charges:   PT Evaluation $PT Eval Moderate Complexity: 1 Mod PT  Treatments $Gait Training: 8-22 mins        Christel Mormon, SPT   Custar Blakeley Margraf 09/08/2019, 3:28 PM

## 2019-09-08 NOTE — Evaluation (Addendum)
Speech Language Pathology Evaluation Patient Details Name: Bryce Logan. MRN: [id number] DOB: [birth year] Today's Date: 09/08/2019 Time: [id number] SLP Time Calculation (min) (ACUTE ONLY): 20 min  Problem List:  Patient Active Problem List   Diagnosis Date Noted  . Acute ischemic stroke ([place]) 09/07/2019  . Middle cerebral artery embolism, left 09/07/2019  . Dyspnea 02/03/2019  . Chest pain 02/03/2019  . CAD (coronary artery disease) 02/03/2019  . Thoracic aortic aneurysm ([place]) 02/03/2019  . Hyperlipidemia 02/03/2019   Past Medical History:  Past Medical History:  Diagnosis Date  . Chronic prostatitis    ELEVATED PSA . ED DR. DAHLSTEDT  . Coronary artery disease   . Depression   . Dizziness 11/11   DR. ROSEN, NORMAL EF, MILD LEAKINESS OF MITRAL VALVE, MILD STIFFNESS OF HEART MUSCLE, MILD ENLARGEMENT OF AORTIC ROOT, REPEAT IN 1 YEAR DR. Radford Pax, NL ETT DR. Radford Pax 12/11  . H/O: GI bleed 2002   DR. HAYES   . Hyperlipidemia   . Insomnia    ON XANAX, DR. Sabra Heck  . Intracerebral hematoma ([place]) 2013   [place]  . Mild bipolar disorder ([place]) 01/2010   DR. PITTMAN DR. [facility] Care, CHR DEPRESSION, FATIGUE OFF MEDS FOR AWHILE, CONTROLLED  . Tubular adenoma 09/17/11   [facility]   Past Surgical History:  Past Surgical History:  Procedure Laterality Date  . COLONOSCOPY  2002  . LEFT KNEE ATHROSCOPY  2002  . LEFT LEG AMBULATORY PHLEBECTOMY  2002   HPI:  IZIAHA Logan is a 69 y.o. male who has a past medical history of hyperlipidemia, enlarged thoracic aorta which is stable as of February 06, 2019 from prior studies, presented via EMS for sudden onset of right-sided weakness and aphasia.  Pt now s/p tPA and successful thrombectomy.   Assessment / Plan / Recommendation Clinical Impression  Pt presents with moderate to severe apraxia of speech.  Pt exhibits inconsistent speech errors, difficulty producing consonant clusters, and difficulty with repetition increasing with  length of target.  Pt was assessed using tasks from the [place] - Bedside Form.  Standardized scores not deemed beneficial/informative in setting of apraxia. With automatic speech tasks, pt benefitted from choral recitation for counting and days of week.  With singing task, pt was able to carry melody throughout task.  Some words and syllables present. Pt exhibits excellent awareness of errors. Pt denies word finding difficulties. Writing is preserved.  Pt writes in complete, grammatical sentences with good word and letter formation. Writing has been a good alternative communication strategy for pt.  Pt is also able to communicate nonverbally through gesture.  Pt did not exhibit any comprehension deficits.  Speech errors are most likely attributable to apraxia.  On OME pt exhibited good facial and lingual range of motion.  Face is largely symmetrical.  Family/visitor reported slight assymetry (much improved from yesterday) on R side.  Pt reports slight L labial numbness. Recommend speech therapy to address the above deficits while in house and at next level of care.      SLP Assessment  SLP Recommendation/Assessment: Patient needs continued Speech Lanaguage Pathology Services SLP Visit Diagnosis: Apraxia (R48.2)    Follow Up Recommendations  Outpatient SLP    Frequency and Duration min 2x/week  2 weeks      SLP Evaluation Cognition  Orientation Level: Oriented X4       Comprehension  Auditory Comprehension Overall Auditory Comprehension: Appears within functional limits for tasks assessed Yes/No Questions: Within Functional Limits  Commands: Within Functional Limits Conversation: Complex    Expression Expression Primary Mode of Expression: Nonverbal - written Verbal Expression Overall Verbal Expression: Impaired Initiation: Impaired Automatic Speech: Counting Level of Generative/Spontaneous Verbalization: Word Repetition: Impaired Level of Impairment: Word  level Naming: Impairment Confrontation: Impaired Verbal Errors: Aware of errors Pragmatics: No impairment Non-Verbal Means of Communication: Writing;Gestures Written Expression Written Expression: Within Functional Limits   Oral / Motor  Oral Motor/Sensory Function Overall Oral Motor/Sensory Function: Mild impairment Facial Symmetry: Abnormal symmetry right(very slight) Facial Sensation: Reduced left Lingual ROM: Within Functional Limits Lingual Symmetry: Within Functional Limits Mandible: Within Functional Limits Motor Speech Articulation: Within functional limitis(seemingly) Motor Planning: Impaired Level of Impairment: Word Motor Speech Errors: Aware   [place], [place], [place] Office: [phone]  09/08/2019, 11:56 AM

## 2019-09-08 NOTE — Progress Notes (Signed)
Echocardiogram 2D Echocardiogram has been performed.  Bryce Logan 09/08/2019, 10:02 AM

## 2019-09-08 NOTE — Progress Notes (Signed)
Referring Physician(s): CODE STROKE- Amie Portland  Supervising Physician: Luanne Bras  Patient Status:  [facility] - In-pt  Chief Complaint: "Speech"  Subjective:  Acute CVA s/p cerebral arteriogram with emergent mechanical thrombectomy of left MCA M2 segment occlusion achieving a TICI 3 revascularization 09/07/2019 by Dr. Estanislado Pandy. Patient awake and alert laying in bed. Demonstrates expressive aphasia. Can spontaneously move all extremities. Right groin incision c/d/i.   Allergies: Sulfa antibiotics  Medications: Prior to Admission medications   Medication Sig Start Date End Date Taking? Authorizing Provider  ALPRAZolam Duanne Moron) 0.5 MG tablet Take 1 mg by mouth at bedtime.  09/21/16  Yes [provider]  amphetamine-dextroamphetamine (ADDERALL) 10 MG tablet Take 10 mg by mouth See admin instructions. Takes 2.5mg  -5mg  daily as needed per patient   Yes [provider]  Ascorbic Acid (VITAMIN C) 1000 MG tablet Take 1,000 mg by mouth daily.   Yes [provider]  Astaxanthin 4 MG CAPS Take 1 capsule by mouth daily.   Yes [provider]  cetirizine (ZYRTEC) 10 MG tablet Take 10 mg by mouth daily as needed for allergies.   Yes [provider]  Coenzyme Q10 (CO Q 10) 100 MG CAPS Take 1 capsule by mouth daily.   Yes [provider]  KRILL OIL PO Take 1 tablet by mouth daily.    Yes [provider]  Magnesium 300 MG CAPS Take 300 mg by mouth daily.    Yes [provider]  NON FORMULARY LITHIUM OROTATE   Yes [provider]  NON FORMULARY Take 1 tablet by mouth daily. Sam-e 400mg  daily   Yes [provider]  zolpidem (AMBIEN) 10 MG tablet Take 5 mg by mouth at bedtime as needed for sleep. Reports breaking tablet into 1/3 tablet   Yes [provider]     Vital Signs: BP 123/83 (BP Location: Left Arm)   Pulse 74   Temp 97.7 F (36.5 C) (Oral)   Resp 15   Wt 164 lb 14.5 oz (74.8 kg)    SpO2 97%   BMI 25.07 kg/m   Physical Exam Vitals and nursing note reviewed.  Constitutional:      General: He is not in acute distress.    Appearance: Normal appearance.  Pulmonary:     Effort: Pulmonary effort is normal. No respiratory distress.  Skin:    General: Skin is warm and dry.     Comments: Right groin incision soft without active bleeding or hematoma.  Neurological:     Mental Status: He is alert.     Comments: Alert, awake, and oriented x3. Follows simple commands. Demonstrates expressive aphasia. PERRL bilaterally. No facial asymmetry. Tongue midline. Can spontaneously move all extremities. No pronator drift. Distal pulses 2+ bilaterally.     Imaging: CT Code Stroke CTA Head W/WO contrast  Result Date: 09/07/2019 CLINICAL DATA:  69 year old male code stroke presentation. EXAM: CT ANGIOGRAPHY HEAD AND NECK CT PERFUSION BRAIN TECHNIQUE: Multidetector CT imaging of the head and neck was performed using the standard protocol during bolus administration of intravenous contrast. Multiplanar CT image reconstructions and MIPs were obtained to evaluate the vascular anatomy. Carotid stenosis measurements (when applicable) are obtained utilizing NASCET criteria, using the distal internal carotid diameter as the denominator. Multiphase CT imaging of the brain was performed following IV bolus contrast injection. Subsequent parametric perfusion maps were calculated using RAPID software. CONTRAST:  179mL OMNIPAQUE IOHEXOL 350 MG/ML SOLN COMPARISON:  None. FINDINGS: CT Brain Perfusion Findings: ASPECTS: Plain  head CT images today not found in PACS. CBF (<30%) Volume: 24 mL Perfusion (Tmax>6.0s) volume: 59mL, hypoperfusion index 0.6 Mismatch Volume: 1mL Infarction Location:Anterior left MCA territory CTA NECK Skeleton: No acute osseous abnormality identified. Upper chest: Negative. Other neck: No acute findings. Aortic arch: 3 vessel arch configuration. No arch atherosclerosis. Right  carotid system: Mild brachiocephalic artery soft plaque without stenosis. Negative right CCA origin. Mild soft and calcified plaque at the medial right ICA bulb without stenosis. Left carotid system: Normal left CCA origin. Negative left carotid bifurcation. Tortuous left ICA just beyond the bulb but no stenosis to the skull base. Vertebral arteries: Mild plaque at the proximal right subclavian artery without stenosis. Normal right vertebral artery origin. Right vertebral artery is patent to the skull base without stenosis. Minimal plaque in the proximal left subclavian artery without stenosis. Normal left vertebral artery origin. The left vertebral is mildly non dominant with a late entry into the cervical transverse foramen but remains patent to the skull base without stenosis. CTA HEAD Posterior circulation: Dominant right vertebral V4 segment. Tortuous left V4. No distal vertebral stenosis. Patent PICA origins and vertebrobasilar junction. Patent basilar artery without stenosis. Normal PCA and SCA origins. Posterior communicating arteries are present, the left is larger. Bilateral PCA branches are within normal limits. Anterior circulation: Both ICA siphons are patent. No plaque or stenosis on the left. Normal left ophthalmic and posterior communicating artery origins. Minimal right siphon calcified plaque without stenosis. Normal right ophthalmic and posterior communicating artery origins. Patent carotid termini. Normal MCA and ACA origins. Anterior communicating artery and bilateral ACA branches are within normal limits. Right MCA M1 is mildly ectatic, a especially at the right MCA trifurcation (series 6, image 27), but there is no stenosis. Other right MCA branches are within normal limits. Left MCA M1 segment is patent, but just beyond the bifurcation and anterior M2 branch is occluded (series 7, image 52 and series 12, image 28. Posterior left MCA branches seem to remain normal. Venous sinuses: Early  contrast timing but grossly patent. Anatomic variants: Dominant right vertebral artery. Review of the MIP images confirms the above findings Study  with Dr. Amie Portland  on 09/07/2019 at 15:02 . IMPRESSION: 1. Positive for emergent large vessel occlusion of the Left MCA anterior M2 branch just beyond the bifurcation. 2. CTP detects 24 mL of infarct core and 71 mL ischemic penumbra in the anterior left MCA territory. 3. The above was discussed by telephone with Dr. Amie Portland on 09/07/2019 at 1502 hours who advises the patient is undergoing endovascular reperfusion. 4. Mild for age underlying atherosclerosis in the head and neck. Ectatic right MCA trifurcation. Electronically Signed   By: Genevie Ann M.D.   On: 09/07/2019 15:11   CT Code Stroke CTA Neck W/WO contrast  Result Date: 09/07/2019 CLINICAL DATA:  69 year old male code stroke presentation. EXAM: CT ANGIOGRAPHY HEAD AND NECK CT PERFUSION BRAIN TECHNIQUE: Multidetector CT imaging of the head and neck was performed using the standard protocol during bolus administration of intravenous contrast. Multiplanar CT image reconstructions and MIPs were obtained to evaluate the vascular anatomy. Carotid stenosis measurements (when applicable) are obtained utilizing NASCET criteria, using the distal internal carotid diameter as the denominator. Multiphase CT imaging of the brain was performed following IV bolus contrast injection. Subsequent parametric perfusion maps were calculated using RAPID software. CONTRAST:  183mL OMNIPAQUE IOHEXOL 350 MG/ML SOLN COMPARISON:  None. FINDINGS: CT Brain Perfusion Findings: ASPECTS: Plain head CT images today not found in PACS.  CBF (<30%) Volume: 24 mL Perfusion (Tmax>6.0s) volume: 49mL, hypoperfusion index 0.6 Mismatch Volume: 52mL Infarction Location:Anterior left MCA territory CTA NECK Skeleton: No acute osseous abnormality identified. Upper chest: Negative. Other neck: No acute findings. Aortic arch: 3 vessel arch configuration. No  arch atherosclerosis. Right carotid system: Mild brachiocephalic artery soft plaque without stenosis. Negative right CCA origin. Mild soft and calcified plaque at the medial right ICA bulb without stenosis. Left carotid system: Normal left CCA origin. Negative left carotid bifurcation. Tortuous left ICA just beyond the bulb but no stenosis to the skull base. Vertebral arteries: Mild plaque at the proximal right subclavian artery without stenosis. Normal right vertebral artery origin. Right vertebral artery is patent to the skull base without stenosis. Minimal plaque in the proximal left subclavian artery without stenosis. Normal left vertebral artery origin. The left vertebral is mildly non dominant with a late entry into the cervical transverse foramen but remains patent to the skull base without stenosis. CTA HEAD Posterior circulation: Dominant right vertebral V4 segment. Tortuous left V4. No distal vertebral stenosis. Patent PICA origins and vertebrobasilar junction. Patent basilar artery without stenosis. Normal PCA and SCA origins. Posterior communicating arteries are present, the left is larger. Bilateral PCA branches are within normal limits. Anterior circulation: Both ICA siphons are patent. No plaque or stenosis on the left. Normal left ophthalmic and posterior communicating artery origins. Minimal right siphon calcified plaque without stenosis. Normal right ophthalmic and posterior communicating artery origins. Patent carotid termini. Normal MCA and ACA origins. Anterior communicating artery and bilateral ACA branches are within normal limits. Right MCA M1 is mildly ectatic, a especially at the right MCA trifurcation (series 6, image 27), but there is no stenosis. Other right MCA branches are within normal limits. Left MCA M1 segment is patent, but just beyond the bifurcation and anterior M2 branch is occluded (series 7, image 52 and series 12, image 28. Posterior left MCA branches seem to remain normal.  Venous sinuses: Early contrast timing but grossly patent. Anatomic variants: Dominant right vertebral artery. Review of the MIP images confirms the above findings Study  with Dr. Amie Portland  on 09/07/2019 at 15:02 . IMPRESSION: 1. Positive for emergent large vessel occlusion of the Left MCA anterior M2 branch just beyond the bifurcation. 2. CTP detects 24 mL of infarct core and 71 mL ischemic penumbra in the anterior left MCA territory. 3. The above was discussed by telephone with Dr. Amie Portland on 09/07/2019 at 1502 hours who advises the patient is undergoing endovascular reperfusion. 4. Mild for age underlying atherosclerosis in the head and neck. Ectatic right MCA trifurcation. Electronically Signed   By: Genevie Ann M.D.   On: 09/07/2019 15:11   CT Code Stroke Cerebral Perfusion with contrast  Result Date: 09/07/2019 CLINICAL DATA:  69 year old male code stroke presentation. EXAM: CT ANGIOGRAPHY HEAD AND NECK CT PERFUSION BRAIN TECHNIQUE: Multidetector CT imaging of the head and neck was performed using the standard protocol during bolus administration of intravenous contrast. Multiplanar CT image reconstructions and MIPs were obtained to evaluate the vascular anatomy. Carotid stenosis measurements (when applicable) are obtained utilizing NASCET criteria, using the distal internal carotid diameter as the denominator. Multiphase CT imaging of the brain was performed following IV bolus contrast injection. Subsequent parametric perfusion maps were calculated using RAPID software. CONTRAST:  164mL OMNIPAQUE IOHEXOL 350 MG/ML SOLN COMPARISON:  None. FINDINGS: CT Brain Perfusion Findings: ASPECTS: Plain head CT images today not found in PACS. CBF (<30%) Volume: 24 mL Perfusion (Tmax>6.0s) volume:  46mL, hypoperfusion index 0.6 Mismatch Volume: 63mL Infarction Location:Anterior left MCA territory CTA NECK Skeleton: No acute osseous abnormality identified. Upper chest: Negative. Other neck: No acute findings. Aortic arch:  3 vessel arch configuration. No arch atherosclerosis. Right carotid system: Mild brachiocephalic artery soft plaque without stenosis. Negative right CCA origin. Mild soft and calcified plaque at the medial right ICA bulb without stenosis. Left carotid system: Normal left CCA origin. Negative left carotid bifurcation. Tortuous left ICA just beyond the bulb but no stenosis to the skull base. Vertebral arteries: Mild plaque at the proximal right subclavian artery without stenosis. Normal right vertebral artery origin. Right vertebral artery is patent to the skull base without stenosis. Minimal plaque in the proximal left subclavian artery without stenosis. Normal left vertebral artery origin. The left vertebral is mildly non dominant with a late entry into the cervical transverse foramen but remains patent to the skull base without stenosis. CTA HEAD Posterior circulation: Dominant right vertebral V4 segment. Tortuous left V4. No distal vertebral stenosis. Patent PICA origins and vertebrobasilar junction. Patent basilar artery without stenosis. Normal PCA and SCA origins. Posterior communicating arteries are present, the left is larger. Bilateral PCA branches are within normal limits. Anterior circulation: Both ICA siphons are patent. No plaque or stenosis on the left. Normal left ophthalmic and posterior communicating artery origins. Minimal right siphon calcified plaque without stenosis. Normal right ophthalmic and posterior communicating artery origins. Patent carotid termini. Normal MCA and ACA origins. Anterior communicating artery and bilateral ACA branches are within normal limits. Right MCA M1 is mildly ectatic, a especially at the right MCA trifurcation (series 6, image 27), but there is no stenosis. Other right MCA branches are within normal limits. Left MCA M1 segment is patent, but just beyond the bifurcation and anterior M2 branch is occluded (series 7, image 52 and series 12, image 28. Posterior left MCA  branches seem to remain normal. Venous sinuses: Early contrast timing but grossly patent. Anatomic variants: Dominant right vertebral artery. Review of the MIP images confirms the above findings Study  with Dr. Amie Portland  on 09/07/2019 at 15:02 . IMPRESSION: 1. Positive for emergent large vessel occlusion of the Left MCA anterior M2 branch just beyond the bifurcation. 2. CTP detects 24 mL of infarct core and 71 mL ischemic penumbra in the anterior left MCA territory. 3. The above was discussed by telephone with Dr. Amie Portland on 09/07/2019 at 1502 hours who advises the patient is undergoing endovascular reperfusion. 4. Mild for age underlying atherosclerosis in the head and neck. Ectatic right MCA trifurcation. Electronically Signed   By: Genevie Ann M.D.   On: 09/07/2019 15:11   CT HEAD CODE STROKE WO CONTRAST  Result Date: 09/07/2019 CLINICAL DATA:  Code stroke. This study identified as missing a report at 1502 hours on 09/07/2019. 69 year old male with code stroke presentation. EXAM: CT HEAD WITHOUT CONTRAST TECHNIQUE: Contiguous axial images were obtained from the base of the skull through the vertex without intravenous contrast. COMPARISON:  Subsequent CTA and CTP today reported separately. FINDINGS: Brain: Normal cerebral volume. No midline shift, mass effect, or evidence of intracranial mass lesion. No acute intracranial hemorrhage identified. No ventriculomegaly. Gray-white matter differentiation is within normal limits throughout the brain. No cortically based acute infarct identified. Vascular: No suspicious intracranial vascular hyperdensity. Skull: No acute osseous abnormality identified. Sinuses/Orbits: Visualized paranasal sinuses and mastoids are well pneumatized. Other: No acute orbit or scalp soft tissue findings. ASPECTS [facility] Stroke Program Early CT Score) Total score (0-10 with  10 being normal): 10 IMPRESSION: Negative noncontrast head CT. ASPECTS 10. This case was discussed by telephone with  Dr. Rory Percy 1502 hours - see CTA and CTP report. Electronically Signed   By: Genevie Ann M.D.   On: 09/07/2019 15:15    Labs:  CBC: Recent Labs    09/07/19 1416 09/07/19 1445 09/08/19 0418  WBC 5.7  --  8.1  HGB 15.4 15.3 13.7  HCT 48.1 45.0 42.1  PLT 184  --  167    COAGS: Recent Labs    09/07/19 1416  INR 1.0  APTT 26    BMP: Recent Labs    09/07/19 1416 09/07/19 1445 09/08/19 0418  NA 140 139 137  K 4.0 3.8 3.5  CL 109 105 106  CO2 22  --  21*  GLUCOSE 132* 125* 102*  BUN 18 20 10   CALCIUM 8.7*  --  7.9*  CREATININE 1.03 1.00 0.86  GFRNONAA >60  --  >60  GFRAA >60  --  >60    LIVER FUNCTION TESTS: Recent Labs    09/07/19 1416  BILITOT 0.6  AST 22  ALT 28  ALKPHOS 39  PROT 6.1*  ALBUMIN 3.8    Assessment and Plan:  Acute CVA s/p cerebral arteriogram with emergent mechanical thrombectomy of left MCA M2 segment occlusion achieving a TICI 3 revascularization 09/07/2019 by Dr. Estanislado Pandy. Patient's condition improving- still with expressive dysphasia, can spontaneously move all extremities. Right groin incision stable, distal pulses 2+ bilaterally. Further plans per neurology- appreciate and agree with management. Please call [facility] with questions/concerns.   Electronically Signed: Earley Abide, PA-C 09/08/2019, 8:39 AM   I spent a total of 25 Minutes at the the patient's bedside AND on the patient's hospital floor or unit, greater than 50% of which was counseling/coordinating care for left MCA M2 segment occlusion s/p revascularization.

## 2019-09-08 NOTE — Progress Notes (Signed)
STROKE TEAM PROGRESS NOTE   INTERVAL HISTORY I have personally reviewed history of presenting illness in detail, imaging films in PACS and electronic medical records.  Patient presented with aphasia and right-sided weakness secondary to left M2 superior division occlusion and was treated successfully with mechanical thrombectomy and is doing well.  He is extubated and sitting up in bed.  Vital signs are stable.  Blood pressure well controlled.  He has expressive aphasia but no weakness  Vitals:   09/08/19 0900 09/08/19 1000 09/08/19 1100 09/08/19 1200  BP: 119/76 (!) 124/91 130/84 128/86  Pulse: 83 87 83 78  Resp: 17 16 12 15   Temp:      TempSrc:      SpO2: 97% 96% 93% 96%  Weight:        CBC:  Recent Labs  Lab 09/07/19 1416 09/07/19 1445 09/08/19 0418  WBC 5.7  --  8.1  NEUTROABS 3.0  --  6.2  HGB 15.4 15.3 13.7  HCT 48.1 45.0 42.1  MCV 95.6  --  95.0  PLT 184  --  [id number]    Basic Metabolic Panel:  Recent Labs  Lab 09/07/19 1416 09/07/19 1445 09/08/19 0418  NA 140 139 137  K 4.0 3.8 3.5  CL 109 105 106  CO2 22  --  21*  GLUCOSE 132* 125* 102*  BUN 18 20 10   CREATININE 1.03 1.00 0.86  CALCIUM 8.7*  --  7.9*   Lipid Panel:     Component Value Date/Time   CHOL 158 09/08/2019 0418   TRIG 66 09/08/2019 0418   HDL 57 09/08/2019 0418   CHOLHDL 2.8 09/08/2019 0418   VLDL 13 09/08/2019 0418   LDLCALC 88 09/08/2019 0418   HgbA1c:  Lab Results  Component Value Date   HGBA1C 5.4 09/08/2019   Urine Drug Screen: No results found for: LABOPIA, COCAINSCRNUR, LABBENZ, AMPHETMU, THCU, LABBARB  Alcohol Level No results found for: ETH  IMAGING past 48 hours CT Code Stroke CTA Head W/WO contrast  Result Date: 09/07/2019 CLINICAL DATA:  69 year old male code stroke presentation. EXAM: CT ANGIOGRAPHY HEAD AND NECK CT PERFUSION BRAIN TECHNIQUE: Multidetector CT imaging of the head and neck was performed using the standard protocol during bolus administration of intravenous  contrast. Multiplanar CT image reconstructions and MIPs were obtained to evaluate the vascular anatomy. Carotid stenosis measurements (when applicable) are obtained utilizing NASCET criteria, using the distal internal carotid diameter as the denominator. Multiphase CT imaging of the brain was performed following IV bolus contrast injection. Subsequent parametric perfusion maps were calculated using RAPID software. CONTRAST:  111mL OMNIPAQUE IOHEXOL 350 MG/ML SOLN COMPARISON:  None. FINDINGS: CT Brain Perfusion Findings: ASPECTS: Plain head CT images today not found in PACS. CBF (<30%) Volume: 24 mL Perfusion (Tmax>6.0s) volume: 85mL, hypoperfusion index 0.6 Mismatch Volume: 52mL Infarction Location:Anterior left MCA territory CTA NECK Skeleton: No acute osseous abnormality identified. Upper chest: Negative. Other neck: No acute findings. Aortic arch: 3 vessel arch configuration. No arch atherosclerosis. Right carotid system: Mild brachiocephalic artery soft plaque without stenosis. Negative right CCA origin. Mild soft and calcified plaque at the medial right ICA bulb without stenosis. Left carotid system: Normal left CCA origin. Negative left carotid bifurcation. Tortuous left ICA just beyond the bulb but no stenosis to the skull base. Vertebral arteries: Mild plaque at the proximal right subclavian artery without stenosis. Normal right vertebral artery origin. Right vertebral artery is patent to the skull base without stenosis. Minimal plaque in the proximal left subclavian  artery without stenosis. Normal left vertebral artery origin. The left vertebral is mildly non dominant with a late entry into the cervical transverse foramen but remains patent to the skull base without stenosis. CTA HEAD Posterior circulation: Dominant right vertebral V4 segment. Tortuous left V4. No distal vertebral stenosis. Patent PICA origins and vertebrobasilar junction. Patent basilar artery without stenosis. Normal PCA and SCA origins.  Posterior communicating arteries are present, the left is larger. Bilateral PCA branches are within normal limits. Anterior circulation: Both ICA siphons are patent. No plaque or stenosis on the left. Normal left ophthalmic and posterior communicating artery origins. Minimal right siphon calcified plaque without stenosis. Normal right ophthalmic and posterior communicating artery origins. Patent carotid termini. Normal MCA and ACA origins. Anterior communicating artery and bilateral ACA branches are within normal limits. Right MCA M1 is mildly ectatic, a especially at the right MCA trifurcation (series 6, image 27), but there is no stenosis. Other right MCA branches are within normal limits. Left MCA M1 segment is patent, but just beyond the bifurcation and anterior M2 branch is occluded (series 7, image 52 and series 12, image 28. Posterior left MCA branches seem to remain normal. Venous sinuses: Early contrast timing but grossly patent. Anatomic variants: Dominant right vertebral artery. Review of the MIP images confirms the above findings Study  with Dr. Amie Portland  on 09/07/2019 at 15:02 . IMPRESSION: 1. Positive for emergent large vessel occlusion of the Left MCA anterior M2 branch just beyond the bifurcation. 2. CTP detects 24 mL of infarct core and 71 mL ischemic penumbra in the anterior left MCA territory. 3. The above was discussed by telephone with Dr. Amie Portland on 09/07/2019 at 1502 hours who advises the patient is undergoing endovascular reperfusion. 4. Mild for age underlying atherosclerosis in the head and neck. Ectatic right MCA trifurcation. Electronically Signed   By: Genevie Ann M.D.   On: 09/07/2019 15:11   CT Code Stroke CTA Neck W/WO contrast  Result Date: 09/07/2019 CLINICAL DATA:  69 year old male code stroke presentation. EXAM: CT ANGIOGRAPHY HEAD AND NECK CT PERFUSION BRAIN TECHNIQUE: Multidetector CT imaging of the head and neck was performed using the standard protocol during bolus  administration of intravenous contrast. Multiplanar CT image reconstructions and MIPs were obtained to evaluate the vascular anatomy. Carotid stenosis measurements (when applicable) are obtained utilizing NASCET criteria, using the distal internal carotid diameter as the denominator. Multiphase CT imaging of the brain was performed following IV bolus contrast injection. Subsequent parametric perfusion maps were calculated using RAPID software. CONTRAST:  15mL OMNIPAQUE IOHEXOL 350 MG/ML SOLN COMPARISON:  None. FINDINGS: CT Brain Perfusion Findings: ASPECTS: Plain head CT images today not found in PACS. CBF (<30%) Volume: 24 mL Perfusion (Tmax>6.0s) volume: 78mL, hypoperfusion index 0.6 Mismatch Volume: 56mL Infarction Location:Anterior left MCA territory CTA NECK Skeleton: No acute osseous abnormality identified. Upper chest: Negative. Other neck: No acute findings. Aortic arch: 3 vessel arch configuration. No arch atherosclerosis. Right carotid system: Mild brachiocephalic artery soft plaque without stenosis. Negative right CCA origin. Mild soft and calcified plaque at the medial right ICA bulb without stenosis. Left carotid system: Normal left CCA origin. Negative left carotid bifurcation. Tortuous left ICA just beyond the bulb but no stenosis to the skull base. Vertebral arteries: Mild plaque at the proximal right subclavian artery without stenosis. Normal right vertebral artery origin. Right vertebral artery is patent to the skull base without stenosis. Minimal plaque in the proximal left subclavian artery without stenosis. Normal left vertebral artery origin.  The left vertebral is mildly non dominant with a late entry into the cervical transverse foramen but remains patent to the skull base without stenosis. CTA HEAD Posterior circulation: Dominant right vertebral V4 segment. Tortuous left V4. No distal vertebral stenosis. Patent PICA origins and vertebrobasilar junction. Patent basilar artery without  stenosis. Normal PCA and SCA origins. Posterior communicating arteries are present, the left is larger. Bilateral PCA branches are within normal limits. Anterior circulation: Both ICA siphons are patent. No plaque or stenosis on the left. Normal left ophthalmic and posterior communicating artery origins. Minimal right siphon calcified plaque without stenosis. Normal right ophthalmic and posterior communicating artery origins. Patent carotid termini. Normal MCA and ACA origins. Anterior communicating artery and bilateral ACA branches are within normal limits. Right MCA M1 is mildly ectatic, a especially at the right MCA trifurcation (series 6, image 27), but there is no stenosis. Other right MCA branches are within normal limits. Left MCA M1 segment is patent, but just beyond the bifurcation and anterior M2 branch is occluded (series 7, image 52 and series 12, image 28. Posterior left MCA branches seem to remain normal. Venous sinuses: Early contrast timing but grossly patent. Anatomic variants: Dominant right vertebral artery. Review of the MIP images confirms the above findings Study  with Dr. Amie Portland  on 09/07/2019 at 15:02 . IMPRESSION: 1. Positive for emergent large vessel occlusion of the Left MCA anterior M2 branch just beyond the bifurcation. 2. CTP detects 24 mL of infarct core and 71 mL ischemic penumbra in the anterior left MCA territory. 3. The above was discussed by telephone with Dr. Amie Portland on 09/07/2019 at 1502 hours who advises the patient is undergoing endovascular reperfusion. 4. Mild for age underlying atherosclerosis in the head and neck. Ectatic right MCA trifurcation. Electronically Signed   By: Genevie Ann M.D.   On: 09/07/2019 15:11   CT Code Stroke Cerebral Perfusion with contrast  Result Date: 09/07/2019 CLINICAL DATA:  69 year old male code stroke presentation. EXAM: CT ANGIOGRAPHY HEAD AND NECK CT PERFUSION BRAIN TECHNIQUE: Multidetector CT imaging of the head and neck was performed  using the standard protocol during bolus administration of intravenous contrast. Multiplanar CT image reconstructions and MIPs were obtained to evaluate the vascular anatomy. Carotid stenosis measurements (when applicable) are obtained utilizing NASCET criteria, using the distal internal carotid diameter as the denominator. Multiphase CT imaging of the brain was performed following IV bolus contrast injection. Subsequent parametric perfusion maps were calculated using RAPID software. CONTRAST:  126mL OMNIPAQUE IOHEXOL 350 MG/ML SOLN COMPARISON:  None. FINDINGS: CT Brain Perfusion Findings: ASPECTS: Plain head CT images today not found in PACS. CBF (<30%) Volume: 24 mL Perfusion (Tmax>6.0s) volume: 51mL, hypoperfusion index 0.6 Mismatch Volume: 65mL Infarction Location:Anterior left MCA territory CTA NECK Skeleton: No acute osseous abnormality identified. Upper chest: Negative. Other neck: No acute findings. Aortic arch: 3 vessel arch configuration. No arch atherosclerosis. Right carotid system: Mild brachiocephalic artery soft plaque without stenosis. Negative right CCA origin. Mild soft and calcified plaque at the medial right ICA bulb without stenosis. Left carotid system: Normal left CCA origin. Negative left carotid bifurcation. Tortuous left ICA just beyond the bulb but no stenosis to the skull base. Vertebral arteries: Mild plaque at the proximal right subclavian artery without stenosis. Normal right vertebral artery origin. Right vertebral artery is patent to the skull base without stenosis. Minimal plaque in the proximal left subclavian artery without stenosis. Normal left vertebral artery origin. The left vertebral is mildly non dominant with  a late entry into the cervical transverse foramen but remains patent to the skull base without stenosis. CTA HEAD Posterior circulation: Dominant right vertebral V4 segment. Tortuous left V4. No distal vertebral stenosis. Patent PICA origins and vertebrobasilar  junction. Patent basilar artery without stenosis. Normal PCA and SCA origins. Posterior communicating arteries are present, the left is larger. Bilateral PCA branches are within normal limits. Anterior circulation: Both ICA siphons are patent. No plaque or stenosis on the left. Normal left ophthalmic and posterior communicating artery origins. Minimal right siphon calcified plaque without stenosis. Normal right ophthalmic and posterior communicating artery origins. Patent carotid termini. Normal MCA and ACA origins. Anterior communicating artery and bilateral ACA branches are within normal limits. Right MCA M1 is mildly ectatic, a especially at the right MCA trifurcation (series 6, image 27), but there is no stenosis. Other right MCA branches are within normal limits. Left MCA M1 segment is patent, but just beyond the bifurcation and anterior M2 branch is occluded (series 7, image 52 and series 12, image 28. Posterior left MCA branches seem to remain normal. Venous sinuses: Early contrast timing but grossly patent. Anatomic variants: Dominant right vertebral artery. Review of the MIP images confirms the above findings Study  with Dr. Amie Portland  on 09/07/2019 at 15:02 . IMPRESSION: 1. Positive for emergent large vessel occlusion of the Left MCA anterior M2 branch just beyond the bifurcation. 2. CTP detects 24 mL of infarct core and 71 mL ischemic penumbra in the anterior left MCA territory. 3. The above was discussed by telephone with Dr. Amie Portland on 09/07/2019 at 1502 hours who advises the patient is undergoing endovascular reperfusion. 4. Mild for age underlying atherosclerosis in the head and neck. Ectatic right MCA trifurcation. Electronically Signed   By: Genevie Ann M.D.   On: 09/07/2019 15:11   CT HEAD CODE STROKE WO CONTRAST  Result Date: 09/07/2019 CLINICAL DATA:  Code stroke. This study identified as missing a report at 1502 hours on 09/07/2019. 69 year old male with code stroke presentation. EXAM: CT HEAD  WITHOUT CONTRAST TECHNIQUE: Contiguous axial images were obtained from the base of the skull through the vertex without intravenous contrast. COMPARISON:  Subsequent CTA and CTP today reported separately. FINDINGS: Brain: Normal cerebral volume. No midline shift, mass effect, or evidence of intracranial mass lesion. No acute intracranial hemorrhage identified. No ventriculomegaly. Gray-white matter differentiation is within normal limits throughout the brain. No cortically based acute infarct identified. Vascular: No suspicious intracranial vascular hyperdensity. Skull: No acute osseous abnormality identified. Sinuses/Orbits: Visualized paranasal sinuses and mastoids are well pneumatized. Other: No acute orbit or scalp soft tissue findings. ASPECTS [facility] Stroke Program Early CT Score) Total score (0-10 with 10 being normal): 10 IMPRESSION: Negative noncontrast head CT. ASPECTS 10. This case was discussed by telephone with Dr. Rory Percy 1502 hours - see CTA and CTP report. Electronically Signed   By: Genevie Ann M.D.   On: 09/07/2019 15:15   Cerebral Angio 09/07/2019 S/P LT common carotid arteriogram followed by complete revascularization of occluded sup division M2 seg of LT MCA  With x 1 pass with 16mm x 33 mm trevoprovue retriever achieving a TICI 3 revascularization.  PHYSICAL EXAM Pleasant middle-aged Caucasian male not in distress   Afebrile. Head is nontraumatic. Neck is supple without bruit.    Cardiac exam no murmur or gallop. Lungs are clear to auscultation. Distal pulses are well  felt Neurological Exam :  Awake alert moderate expressive aphasia with significant word hesitancy and paraphasic errors.  Able to name and repeat with some difficulty.  Good comprehension.  Mild dysarthria.  Extraocular movements full range without nystagmus.  Blinks to threat bilaterally.  Follows commands well.  Mild right lower facial weakness.  Tongue midline.  Motor system exam symmetric upper and lower extremity  strength.  No drift.  No focal weakness.  Slight diminished fine finger movements on the right and orbits left over right upper extremity.  Gait not tested. NIH stroke scale score 6 .ASSESSMENT/PLAN Bryce Logan. is a 69 y.o. male with history of hyperlipidemia and enlarged thoracic aortic aneurysm presenting with right-sided facial droop, aphasia, right-sided weakness.  Received IV TPA 09/07/2019 at 1434. Taken to IR for left M2 occlusion.   Stroke:  left MCA territory infarct s/p tPA and IR w/ TICI3 revascularization, embolic secondary to unknown source  Resultant: dysarthria, R hemiparesis    Code Stroke CT head No acute abnormality. ASPECTS 10.     CTA head & neck ELVO L MCA M2 branch just beyond bifurcation. Mild atherosclerosis  CT perfusion 24 mL core and 71 mL penumbra anterior L MCA territory   Cerebral angio occluded superior division L MCA M2 w/ TICI3 revascularization using trevo   Post IR CT no hemorrhage or mass effect   Sheath out, extubated  MRI pending  2D Echo pending  LDL 88  HgbA1c 5.4  SCDs for VTE prophylaxis  No antithrombotic prior to admission, now on No antithrombotic. Cleared for aspirin 81 mg daily by Dr. Leonie Man after review of imaging. Will start once 24h post tPA window completed  Therapy recommendations:  Pending. Ok to be OOB this am  Disposition:  pending   D/c aline  Anticipate d/c home if stable and without IP therapy needs tomorrow  Blood Pressure  No hx HTN, on no home meds  BP goal per IR x 24h following IR procedure and tPA administration.   Long-term BP goal normotensive  Hyperlipidemia  Home meds:  No statin   Add lipitor 20 (not intensive statin) given LDL level  LDL 88, goal < 70  Continue statin at discharge  Other Stroke Risk Factors  Advanced age  ETOH use,  advised to drink no more than 2 drink(s) a day  Hx Substance abuse - marijuana, UDS not collected  Hx stroke/TIA - unable to find records related  to Intracerebral hemat[facility] at [facility]. Pt denies previous hx of stroke   Coronary artery disease  Other Active Problems  Mild bipolar d/o  Thoracic Aortic aneurysm - stable from imaging back in the [facility] day # 1  I have personally obtained history,examined this patient, reviewed notes, independently viewed imaging studies, participated in medical decision making and plan of care.ROS completed by me personally and pertinent positives fully documented  I have made any additions or clarifications directly to the above note.  He presented with left MCA infarct due to embolic occlusion of superior division of left MCA and was treated with successful mechanical thrombectomy.  He still has some residual expressive aphasia with is otherwise doing well.  Continue ongoing stroke work-up.  Strict blood pressure control as per post interventional protocol.  Mobile lives out of bed.  Therapy consults.  Speech therapy for language.  Dual antiplatelet therapy for now for 3 weeks followed by aspirin.  He may need prolonged cardiac monitoring and TEE to look for cardiac source of embolism.  This may have to be arranged as an outpatient.  No family  at the bedside.  Discussed with Dr. Estanislado Pandy.  This patient is critically ill and at significant risk of neurological worsening, death and care requires constant monitoring of vital signs, hemodynamics,respiratory and cardiac monitoring, extensive review of multiple databases, frequent neurological assessment, discussion with family, other specialists and medical decision making of high complexity.I have made any additions or clarifications directly to the above note.This critical care time does not reflect procedure time, or teaching time or supervisory time of PA/NP/Med Resident etc but could involve care discussion time.  I spent 30 minutes of neurocritical care time  in the care of  this patient.     Antony Contras, MD Medical  Director [facility] Pager: [phone] 09/08/2019 4:25 PM   To contact Stroke Continuity provider, please refer to [email or web address] After hours, contact General Neurology

## 2019-09-08 NOTE — Progress Notes (Signed)
OT Cancellation Note  Patient Details Name: Bryce Logan. MRN: [id number] DOB: 1950/12/06   Cancelled Treatment:    Reason Eval/Treat Not Completed: Active bedrest order.  Will check back as schedule allows.   Nilsa Nutting., OTR/L Acute Rehabilitation Services Pager [phone] Office [phone]  Lucille Passy M 09/08/2019, 9:51 AM

## 2019-09-08 NOTE — Plan of Care (Signed)
  Problem: Clinical Measurements: Goal: Ability to maintain clinical measurements within normal limits will improve Outcome: Progressing   

## 2019-09-08 NOTE — Progress Notes (Signed)
Large whip and dampened waveform on arterial line, no longer correlating with cuff pressure. Will go by cuff from 0500 forward.

## 2019-09-08 NOTE — Plan of Care (Signed)
  Problem: Education: Goal: Knowledge of disease or condition will improve Outcome: Progressing Goal: Knowledge of secondary prevention will improve Outcome: Progressing Goal: Knowledge of patient specific risk factors addressed and post discharge goals established will improve Outcome: Progressing Goal: Individualized Educational Video(s) Outcome: Progressing   Problem: Coping: Goal: Will verbalize positive feelings about self Outcome: Progressing Goal: Will identify appropriate support needs Outcome: Progressing   Problem: Health Behavior/Discharge Planning: Goal: Ability to manage health-related needs will improve Outcome: Progressing   Problem: Self-Care: Goal: Ability to participate in self-care as condition permits will improve Outcome: Progressing Goal: Verbalization of feelings and concerns over difficulty with self-care will improve Outcome: Progressing Goal: Ability to communicate needs accurately will improve Outcome: Progressing   Problem: Nutrition: Goal: Dietary intake will improve Outcome: Progressing   Problem: Ischemic Stroke/TIA Tissue Perfusion: Goal: Complications of ischemic stroke/TIA will be minimized Outcome: Progressing   

## 2019-09-09 ENCOUNTER — Inpatient Hospital Stay (HOSPITAL_COMMUNITY): Payer: Medicare HMO

## 2019-09-09 DIAGNOSIS — I639 Cerebral infarction, unspecified: Secondary | ICD-10-CM

## 2019-09-09 DIAGNOSIS — I6602 Occlusion and stenosis of left middle cerebral artery: Secondary | ICD-10-CM

## 2019-09-09 DIAGNOSIS — E7800 Pure hypercholesterolemia, unspecified: Secondary | ICD-10-CM

## 2019-09-09 DIAGNOSIS — R4701 Aphasia: Secondary | ICD-10-CM

## 2019-09-09 LAB — BASIC METABOLIC PANEL
Anion gap: 8 (ref 5–15)
BUN: 12 mg/dL (ref 8–23)
CO2: 23 mmol/L (ref 22–32)
Calcium: 8.4 mg/dL — ABNORMAL LOW (ref 8.9–10.3)
Chloride: 110 mmol/L (ref 98–111)
Creatinine, Ser: 0.93 mg/dL (ref 0.61–1.24)
GFR calc Af Amer: >60 mL/min (ref >60)
GFR calc non Af Amer: >60 mL/min (ref >60)
Glucose, Bld: 99 mg/dL (ref 70–99)
Potassium: 3.6 mmol/L (ref 3.5–5.1)
Sodium: 141 mmol/L (ref 135–145)

## 2019-09-09 LAB — CBC
HCT: 42 % (ref 39.0–52.0)
Hemoglobin: 13.6 g/dL (ref 13.0–17.0)
MCH: 30.5 pg (ref 26.0–34.0)
MCHC: 32.4 g/dL (ref 30.0–36.0)
MCV: 94.2 fL (ref 80.0–100.0)
Platelets: 156 10*3/uL (ref 150–400)
RBC: 4.46 MIL/uL (ref 4.22–5.81)
RDW: 12.8 % (ref 11.5–15.5)
WBC: 6.7 10*3/uL (ref 4.0–10.5)
nRBC: 0 % (ref 0.0–0.2)

## 2019-09-09 MED ORDER — MELATONIN 3 MG PO TABS
6.0000 mg | ORAL_TABLET | Freq: Every evening | ORAL | Status: DC | PRN
  Administered 2019-09-09 – 2019-09-12 (×3): 6 mg via ORAL
  Filled 2019-09-09 (×6): qty 2

## 2019-09-09 MED ORDER — PANTOPRAZOLE SODIUM 40 MG PO TBEC
40.0000 mg | DELAYED_RELEASE_TABLET | Freq: Every day | ORAL | Status: DC
  Administered 2019-09-09 – 2019-09-11 (×3): 40 mg via ORAL
  Filled 2019-09-09 (×3): qty 1

## 2019-09-09 MED ORDER — LABETALOL HCL 5 MG/ML IV SOLN: 5.0000 mg | INTRAVENOUS | Status: DC | PRN

## 2019-09-09 MED ORDER — CLOPIDOGREL BISULFATE 75 MG PO TABS
75.0000 mg | ORAL_TABLET | Freq: Every day | ORAL | Status: DC
  Administered 2019-09-09 – 2019-09-12 (×4): 75 mg via ORAL
  Filled 2019-09-09 (×4): qty 1

## 2019-09-09 MED ORDER — PRAVASTATIN SODIUM 10 MG PO TABS
20.0000 mg | ORAL_TABLET | Freq: Every day | ORAL | Status: DC
  Administered 2019-09-09 – 2019-09-12 (×4): 20 mg via ORAL
  Filled 2019-09-09 (×4): qty 2

## 2019-09-09 NOTE — Progress Notes (Signed)
VASCULAR LAB PRELIMINARY  PRELIMINARY  PRELIMINARY  PRELIMINARY  Bilateral lower extremity venous duplex completed.    Preliminary report:  See CV proc for preliminary results.   Ayriel Texidor, RVT 09/09/2019, 1:37 PM

## 2019-09-09 NOTE — Progress Notes (Signed)
Physical Therapy Treatment and DISCHARGE Patient Details Name: Bryce Logan. MRN: [id number] DOB: 11/02/1950 Today's Date: 09/09/2019    History of Present Illness Bryce Logan is a 69 y.o. male who has a past medical history of hyperlipidemia, enlarged thoracic aorta which is stable as of February 06, 2019 from prior studies, presented via EMS for sudden onset of right-sided weakness and aphasia.  Pt now s/p tPA and successful thrombectomy.     PT Comments    Pt functioning at baseline level of function with exception of expressive aphasia. Pt is able to write appropriately and clearly. Pt scored 24/24 on DGI and was able to maintain balance during mod/max perturbations.  Pt with no further acute PT needs at this time. PT SIGNING OFF. Please re-consult if needed in future.   Follow Up Recommendations  No PT follow up     Equipment Recommendations  None recommended by PT    Recommendations for Other Services       Precautions / Restrictions Precautions Precautions: None Restrictions Weight Bearing Restrictions: No    Mobility  Bed Mobility Overal bed mobility: Independent             General bed mobility comments: pt transferred into bed without difficulty and safely  Transfers Overall transfer level: Independent Equipment used: None             General transfer comment: no difficulty, steady  Ambulation/Gait Ambulation/Gait assistance: Supervision Gait Distance (Feet): 600 Feet Assistive device: None Gait Pattern/deviations: WFL(Within Functional Limits)     General Gait Details: pt steady and has returned to normal gait pattern   Stairs Stairs: Yes Stairs assistance: Supervision Stair Management: Forwards Number of Stairs: 12 General stair comments: no need for railing, completed with difficulty   Wheelchair Mobility    Modified Rankin (Stroke Patients Only) Modified Rankin (Stroke Patients Only) Pre-Morbid Rankin Score: No symptoms Modified  Rankin: Slight disability     Balance Overall balance assessment: Independent                               Standardized Balance Assessment Standardized Balance Assessment : Dynamic Gait Index   Dynamic Gait Index Level Surface: Normal Change in Gait Speed: Normal Gait with Horizontal Head Turns: Normal Gait with Vertical Head Turns: Normal Gait and Pivot Turn: Normal Step Over Obstacle: Normal Step Around Obstacles: Normal Steps: Normal Total Score: 24      Cognition Arousal/Alertness: Awake/alert Behavior During Therapy: WFL for tasks assessed/performed Overall Cognitive Status: Within Functional Limits for tasks assessed                                 General Comments: pt with expressive aphasia but is able to write      Exercises      General Comments General comments (skin integrity, edema, etc.): SpO2 92-93% on RA at rest, pt at 95-97% on RA s/p amb      Pertinent Vitals/Pain Pain Assessment: Faces Faces Pain Scale: No hurt    Home Living                      Prior Function            PT Goals (current goals can now be found in the care plan section) Progress towards PT goals: Goals met/education completed, patient discharged from PT  Frequency           PT Plan Other (comment)(pt d/c from acute PT services)    Co-evaluation              AM-PAC PT "6 Clicks" Mobility   Outcome Measure  Help needed turning from your back to your side while in a flat bed without using bedrails?: None Help needed moving from lying on your back to sitting on the side of a flat bed without using bedrails?: None Help needed moving to and from a bed to a chair (including a wheelchair)?: None Help needed standing up from a chair using your arms (e.g., wheelchair or bedside chair)?: None Help needed to walk in hospital room?: None Help needed climbing 3-5 steps with a railing? : None 6 Click Score: 24    End of  Session Equipment Utilized During Treatment: Gait belt Activity Tolerance: Patient tolerated treatment well Patient left: in bed;with call bell/phone within reach Nurse Communication: Mobility status       Time: 3246-9978 PT Time Calculation (min) (ACUTE ONLY): 15 min  Charges:  $Gait Training: 8-22 mins                     Kittie Plater, PT, DPT Acute Rehabilitation Services Pager #: [phone] Office #: [phone]    Berline Lopes 09/09/2019, 9:22 AM

## 2019-09-09 NOTE — Plan of Care (Signed)
  Problem: Self-Care: Goal: Ability to participate in self-care as condition permits will improve Outcome: Progressing   

## 2019-09-09 NOTE — Evaluation (Addendum)
Occupational Therapy Evaluation and Discharge   Patient Details Name: Bryce Logan. MRN: [id number] DOB: 03-Aug-1951 Today's Date: 09/09/2019    History of Present Illness Bryce Logan is a 69 y.o. male who has a past medical history of hyperlipidemia, enlarged thoracic aorta which is stable as of February 06, 2019 from prior studies, presented via EMS for sudden onset of right-sided weakness and aphasia.  Pt now s/p tPA and successful thrombectomy.    Clinical Impression   PTA patient independent.  Admitted for above and limited by expressive aphasia.  Patient communicating with gestures and writing, very proficient. Patient demonstrating independence with basic ADLs, mobility and transfers.  Completing hallway navigation, 3 step trail making task with independence. Cognition,vision, strength, sensation and balance WFL.  Patient with no acute OT needs identified.  He will benefit from intensive SLP services.  OT will sign off. Please re-consult if needed.     Follow Up Recommendations  No OT follow up;Supervision - Intermittent    Equipment Recommendations  None recommended by OT    Recommendations for Other Services       Precautions / Restrictions Precautions Precautions: None Restrictions Weight Bearing Restrictions: No      Mobility Bed Mobility Overal bed mobility: Independent                Transfers Overall transfer level: Independent Equipment used: None                  Balance Overall balance assessment: Independent                                         ADL either performed or assessed with clinical judgement   ADL Overall ADL's : Independent                                       General ADL Comments: pt independent with self care, transfers and mobility       Vision Patient Visual Report: No change from baseline Vision Assessment?: No apparent visual deficits     Perception     Praxis      Pertinent  Vitals/Pain Pain Assessment: Faces Pain Score: 0-No pain Faces Pain Scale: No hurt     Hand Dominance Right   Extremity/Trunk Assessment Upper Extremity Assessment Upper Extremity Assessment: Overall WFL for tasks assessed   Lower Extremity Assessment Lower Extremity Assessment: Defer to PT evaluation       Communication Communication Communication: Expressive difficulties   Cognition Arousal/Alertness: Awake/alert Behavior During Therapy: WFL for tasks assessed/performed Overall Cognitive Status: Within Functional Limits for tasks assessed                                 General Comments: pt with expressive aphasia but is able to write   General Comments  engaged in trail making task, completing all 3 tasks with independence demosntrating good recall, problem sovling and awareness     Exercises     Shoulder Instructions      Home Living Family/patient expects to be discharged to:: Private residence Living Arrangements: Alone Available Help at Discharge: Other (Comment);Personal care attendant(girlfriend available as needed ) Type of Home: House Home Access: Stairs to enter [employer] of  Steps: 2 Entrance Stairs-Rails: None Home Layout: One level     Bathroom Shower/Tub: Teacher, early years/pre: Handicapped height Bathroom Accessibility: Yes   Home Equipment: None          Prior Functioning/Environment Level of Independence: Independent                 OT Problem List:        OT Treatment/Interventions:      OT Goals(Current goals can be found in the care plan section) Acute Rehab OT Goals Patient Stated Goal: to be able to speak OT Goal Formulation: With patient  OT Frequency:     Barriers to D/C:            Co-evaluation              AM-PAC OT "6 Clicks" Daily Activity     Outcome Measure Help from another person eating meals?: None Help from another person taking care of personal grooming?:  None Help from another person toileting, which includes using toliet, bedpan, or urinal?: None Help from another person bathing (including washing, rinsing, drying)?: None Help from another person to put on and taking off regular upper body clothing?: None Help from another person to put on and taking off regular lower body clothing?: None 6 Click Score: 24   End of Session Nurse Communication: Mobility status  Activity Tolerance: Patient tolerated treatment well Patient left: in bed;with call bell/phone within reach  OT Visit Diagnosis: Cognitive communication deficit (R41.841) Symptoms and signs involving cognitive functions: Cerebral infarction                Time: [id number] OT Time Calculation (min): 11 min Charges:  OT General Charges $OT Visit: 1 Visit OT Evaluation $OT Eval Low Complexity: 1 Low  Jolaine Artist, OT Acute Rehabilitation Services Pager [phone] Office [phone]   Delight Stare 09/09/2019, 2:04 PM

## 2019-09-09 NOTE — Progress Notes (Signed)
STROKE TEAM PROGRESS NOTE   INTERVAL HISTORY Pt sitting in bed, still has expressive aphasia but improving. He was able to repeat sentences but with many missing syllables and paraphasic errors and hesitation. However, he was able to write in sentences. UEs and LEs strong. PT/OT no recs. Discussed with pt about further work up for TEE and loop, he agreed to stay for work up likely Monday.   Vitals:   09/09/19 0300 09/09/19 0400 09/09/19 0500 09/09/19 0609  BP: 108/71 108/75 112/72 93/66  Pulse: 62 63 72 66  Resp: 15 15 17 15   Temp:  98.7 F (37.1 C)    TempSrc:  Oral    SpO2: 95% 95% 96% 96%  Weight:        CBC:  Recent Labs  Lab 09/07/19 1416 09/08/19 0418 09/09/19 0541  WBC 5.7 8.1 6.7  NEUTROABS 3.0 6.2  --   HGB 15.4 13.7 13.6  HCT 48.1 42.1 42.0  MCV 95.6 95.0 94.2  PLT 184 167 [id number]    Basic Metabolic Panel:  Recent Labs  Lab 09/08/19 0418 09/09/19 0541  NA 137 141  K 3.5 3.6  CL 106 110  CO2 21* 23  GLUCOSE 102* 99  BUN 10 12  CREATININE 0.86 0.93  CALCIUM 7.9* 8.4*   Lipid Panel:     Component Value Date/Time   CHOL 158 09/08/2019 0418   TRIG 66 09/08/2019 0418   HDL 57 09/08/2019 0418   CHOLHDL 2.8 09/08/2019 0418   VLDL 13 09/08/2019 0418   LDLCALC 88 09/08/2019 0418   HgbA1c:  Lab Results  Component Value Date   HGBA1C 5.4 09/08/2019   Urine Drug Screen: No results found for: LABOPIA, COCAINSCRNUR, LABBENZ, AMPHETMU, THCU, LABBARB  Alcohol Level No results found for: ETH  IMAGING past 48 hours CT Code Stroke CTA Head W/WO contrast  Result Date: 09/07/2019 CLINICAL DATA:  69 year old male code stroke presentation. EXAM: CT ANGIOGRAPHY HEAD AND NECK CT PERFUSION BRAIN TECHNIQUE: Multidetector CT imaging of the head and neck was performed using the standard protocol during bolus administration of intravenous contrast. Multiplanar CT image reconstructions and MIPs were obtained to evaluate the vascular anatomy. Carotid stenosis measurements (when  applicable) are obtained utilizing NASCET criteria, using the distal internal carotid diameter as the denominator. Multiphase CT imaging of the brain was performed following IV bolus contrast injection. Subsequent parametric perfusion maps were calculated using RAPID software. CONTRAST:  178mL OMNIPAQUE IOHEXOL 350 MG/ML SOLN COMPARISON:  None. FINDINGS: CT Brain Perfusion Findings: ASPECTS: Plain head CT images today not found in PACS. CBF (<30%) Volume: 24 mL Perfusion (Tmax>6.0s) volume: 18mL, hypoperfusion index 0.6 Mismatch Volume: 79mL Infarction Location:Anterior left MCA territory CTA NECK Skeleton: No acute osseous abnormality identified. Upper chest: Negative. Other neck: No acute findings. Aortic arch: 3 vessel arch configuration. No arch atherosclerosis. Right carotid system: Mild brachiocephalic artery soft plaque without stenosis. Negative right CCA origin. Mild soft and calcified plaque at the medial right ICA bulb without stenosis. Left carotid system: Normal left CCA origin. Negative left carotid bifurcation. Tortuous left ICA just beyond the bulb but no stenosis to the skull base. Vertebral arteries: Mild plaque at the proximal right subclavian artery without stenosis. Normal right vertebral artery origin. Right vertebral artery is patent to the skull base without stenosis. Minimal plaque in the proximal left subclavian artery without stenosis. Normal left vertebral artery origin. The left vertebral is mildly non dominant with a late entry into the cervical transverse foramen but remains patent  to the skull base without stenosis. CTA HEAD Posterior circulation: Dominant right vertebral V4 segment. Tortuous left V4. No distal vertebral stenosis. Patent PICA origins and vertebrobasilar junction. Patent basilar artery without stenosis. Normal PCA and SCA origins. Posterior communicating arteries are present, the left is larger. Bilateral PCA branches are within normal limits. Anterior circulation:  Both ICA siphons are patent. No plaque or stenosis on the left. Normal left ophthalmic and posterior communicating artery origins. Minimal right siphon calcified plaque without stenosis. Normal right ophthalmic and posterior communicating artery origins. Patent carotid termini. Normal MCA and ACA origins. Anterior communicating artery and bilateral ACA branches are within normal limits. Right MCA M1 is mildly ectatic, a especially at the right MCA trifurcation (series 6, image 27), but there is no stenosis. Other right MCA branches are within normal limits. Left MCA M1 segment is patent, but just beyond the bifurcation and anterior M2 branch is occluded (series 7, image 52 and series 12, image 28. Posterior left MCA branches seem to remain normal. Venous sinuses: Early contrast timing but grossly patent. Anatomic variants: Dominant right vertebral artery. Review of the MIP images confirms the above findings Study  with Dr. Amie Portland  on 09/07/2019 at 15:02 . IMPRESSION: 1. Positive for emergent large vessel occlusion of the Left MCA anterior M2 branch just beyond the bifurcation. 2. CTP detects 24 mL of infarct core and 71 mL ischemic penumbra in the anterior left MCA territory. 3. The above was discussed by telephone with Dr. Amie Portland on 09/07/2019 at 1502 hours who advises the patient is undergoing endovascular reperfusion. 4. Mild for age underlying atherosclerosis in the head and neck. Ectatic right MCA trifurcation. Electronically Signed   By: Genevie Ann M.D.   On: 09/07/2019 15:11   CT Code Stroke CTA Neck W/WO contrast  Result Date: 09/07/2019 CLINICAL DATA:  69 year old male code stroke presentation. EXAM: CT ANGIOGRAPHY HEAD AND NECK CT PERFUSION BRAIN TECHNIQUE: Multidetector CT imaging of the head and neck was performed using the standard protocol during bolus administration of intravenous contrast. Multiplanar CT image reconstructions and MIPs were obtained to evaluate the vascular anatomy. Carotid  stenosis measurements (when applicable) are obtained utilizing NASCET criteria, using the distal internal carotid diameter as the denominator. Multiphase CT imaging of the brain was performed following IV bolus contrast injection. Subsequent parametric perfusion maps were calculated using RAPID software. CONTRAST:  128mL OMNIPAQUE IOHEXOL 350 MG/ML SOLN COMPARISON:  None. FINDINGS: CT Brain Perfusion Findings: ASPECTS: Plain head CT images today not found in PACS. CBF (<30%) Volume: 24 mL Perfusion (Tmax>6.0s) volume: 19mL, hypoperfusion index 0.6 Mismatch Volume: 42mL Infarction Location:Anterior left MCA territory CTA NECK Skeleton: No acute osseous abnormality identified. Upper chest: Negative. Other neck: No acute findings. Aortic arch: 3 vessel arch configuration. No arch atherosclerosis. Right carotid system: Mild brachiocephalic artery soft plaque without stenosis. Negative right CCA origin. Mild soft and calcified plaque at the medial right ICA bulb without stenosis. Left carotid system: Normal left CCA origin. Negative left carotid bifurcation. Tortuous left ICA just beyond the bulb but no stenosis to the skull base. Vertebral arteries: Mild plaque at the proximal right subclavian artery without stenosis. Normal right vertebral artery origin. Right vertebral artery is patent to the skull base without stenosis. Minimal plaque in the proximal left subclavian artery without stenosis. Normal left vertebral artery origin. The left vertebral is mildly non dominant with a late entry into the cervical transverse foramen but remains patent to the skull base without stenosis. CTA HEAD  Posterior circulation: Dominant right vertebral V4 segment. Tortuous left V4. No distal vertebral stenosis. Patent PICA origins and vertebrobasilar junction. Patent basilar artery without stenosis. Normal PCA and SCA origins. Posterior communicating arteries are present, the left is larger. Bilateral PCA branches are within normal  limits. Anterior circulation: Both ICA siphons are patent. No plaque or stenosis on the left. Normal left ophthalmic and posterior communicating artery origins. Minimal right siphon calcified plaque without stenosis. Normal right ophthalmic and posterior communicating artery origins. Patent carotid termini. Normal MCA and ACA origins. Anterior communicating artery and bilateral ACA branches are within normal limits. Right MCA M1 is mildly ectatic, a especially at the right MCA trifurcation (series 6, image 27), but there is no stenosis. Other right MCA branches are within normal limits. Left MCA M1 segment is patent, but just beyond the bifurcation and anterior M2 branch is occluded (series 7, image 52 and series 12, image 28. Posterior left MCA branches seem to remain normal. Venous sinuses: Early contrast timing but grossly patent. Anatomic variants: Dominant right vertebral artery. Review of the MIP images confirms the above findings Study  with Dr. Amie Portland  on 09/07/2019 at 15:02 . IMPRESSION: 1. Positive for emergent large vessel occlusion of the Left MCA anterior M2 branch just beyond the bifurcation. 2. CTP detects 24 mL of infarct core and 71 mL ischemic penumbra in the anterior left MCA territory. 3. The above was discussed by telephone with Dr. Amie Portland on 09/07/2019 at 1502 hours who advises the patient is undergoing endovascular reperfusion. 4. Mild for age underlying atherosclerosis in the head and neck. Ectatic right MCA trifurcation. Electronically Signed   By: Genevie Ann M.D.   On: 09/07/2019 15:11   MR BRAIN WO CONTRAST  Result Date: 09/08/2019 CLINICAL DATA:  Stroke follow-up. Status post endovascular revascularization of left M2 occlusion. Presenting symptoms of right hemiparesis and aphasia. EXAM: MRI HEAD WITHOUT CONTRAST TECHNIQUE: Multiplanar, multiecho pulse sequences of the brain and surrounding structures were obtained without intravenous contrast. COMPARISON:  Head CT, CTA, and CTP  09/07/2019 FINDINGS: Brain: There are scattered small acute left MCA territory infarcts in the left frontal lobe with the largest located in the frontal operculum where there is a small amount of petechial hemorrhage. A few punctate acute cortical infarcts are also noted in the medial aspect of the left superior frontal gyrus posteriorly as well as in the left parietal lobe. There is no intracranial mass effect or extra-axial fluid collection. The ventricles are normal in size. No significant chronic white matter disease is seen for age. Vascular: Major intracranial arterial flow voids are preserved. There is abnormal T2 hyperintensity throughout the nondominant right transverse sinus which did not enhance on CTA suggesting occlusion (versus very slow flow such as from a distal stenosis), however there is no associated regional infarct or hemorrhage. Skull and upper cervical spine: Unremarkable bone marrow signal. Sinuses/Orbits: Unremarkable orbits. Paranasal sinuses and mastoid air cells are clear. Other: None. IMPRESSION: 1. Scattered small acute left cerebral infarcts greatest in the frontal operculum. 2. Abnormal appearance of the right transverse sinus most suggestive of occlusion (versus very slow flow due to a distal stenosis). No associated acute or chronic infarct or hemorrhage. Electronically Signed   By: Logan Bores M.D.   On: 09/08/2019 15:17   CT Code Stroke Cerebral Perfusion with contrast  Result Date: 09/07/2019 CLINICAL DATA:  69 year old male code stroke presentation. EXAM: CT ANGIOGRAPHY HEAD AND NECK CT PERFUSION BRAIN TECHNIQUE: Multidetector CT imaging of the  head and neck was performed using the standard protocol during bolus administration of intravenous contrast. Multiplanar CT image reconstructions and MIPs were obtained to evaluate the vascular anatomy. Carotid stenosis measurements (when applicable) are obtained utilizing NASCET criteria, using the distal internal carotid diameter as  the denominator. Multiphase CT imaging of the brain was performed following IV bolus contrast injection. Subsequent parametric perfusion maps were calculated using RAPID software. CONTRAST:  173mL OMNIPAQUE IOHEXOL 350 MG/ML SOLN COMPARISON:  None. FINDINGS: CT Brain Perfusion Findings: ASPECTS: Plain head CT images today not found in PACS. CBF (<30%) Volume: 24 mL Perfusion (Tmax>6.0s) volume: 28mL, hypoperfusion index 0.6 Mismatch Volume: 35mL Infarction Location:Anterior left MCA territory CTA NECK Skeleton: No acute osseous abnormality identified. Upper chest: Negative. Other neck: No acute findings. Aortic arch: 3 vessel arch configuration. No arch atherosclerosis. Right carotid system: Mild brachiocephalic artery soft plaque without stenosis. Negative right CCA origin. Mild soft and calcified plaque at the medial right ICA bulb without stenosis. Left carotid system: Normal left CCA origin. Negative left carotid bifurcation. Tortuous left ICA just beyond the bulb but no stenosis to the skull base. Vertebral arteries: Mild plaque at the proximal right subclavian artery without stenosis. Normal right vertebral artery origin. Right vertebral artery is patent to the skull base without stenosis. Minimal plaque in the proximal left subclavian artery without stenosis. Normal left vertebral artery origin. The left vertebral is mildly non dominant with a late entry into the cervical transverse foramen but remains patent to the skull base without stenosis. CTA HEAD Posterior circulation: Dominant right vertebral V4 segment. Tortuous left V4. No distal vertebral stenosis. Patent PICA origins and vertebrobasilar junction. Patent basilar artery without stenosis. Normal PCA and SCA origins. Posterior communicating arteries are present, the left is larger. Bilateral PCA branches are within normal limits. Anterior circulation: Both ICA siphons are patent. No plaque or stenosis on the left. Normal left ophthalmic and posterior  communicating artery origins. Minimal right siphon calcified plaque without stenosis. Normal right ophthalmic and posterior communicating artery origins. Patent carotid termini. Normal MCA and ACA origins. Anterior communicating artery and bilateral ACA branches are within normal limits. Right MCA M1 is mildly ectatic, a especially at the right MCA trifurcation (series 6, image 27), but there is no stenosis. Other right MCA branches are within normal limits. Left MCA M1 segment is patent, but just beyond the bifurcation and anterior M2 branch is occluded (series 7, image 52 and series 12, image 28. Posterior left MCA branches seem to remain normal. Venous sinuses: Early contrast timing but grossly patent. Anatomic variants: Dominant right vertebral artery. Review of the MIP images confirms the above findings Study  with Dr. Amie Portland  on 09/07/2019 at 15:02 . IMPRESSION: 1. Positive for emergent large vessel occlusion of the Left MCA anterior M2 branch just beyond the bifurcation. 2. CTP detects 24 mL of infarct core and 71 mL ischemic penumbra in the anterior left MCA territory. 3. The above was discussed by telephone with Dr. Amie Portland on 09/07/2019 at 1502 hours who advises the patient is undergoing endovascular reperfusion. 4. Mild for age underlying atherosclerosis in the head and neck. Ectatic right MCA trifurcation. Electronically Signed   By: Genevie Ann M.D.   On: 09/07/2019 15:11   ECHOCARDIOGRAM COMPLETE BUBBLE STUDY  Result Date: 09/08/2019   ECHOCARDIOGRAM REPORT   Patient Name:   Bryce Logan. Date of Exam: 09/08/2019 Medical Rec #:  [id number]       Height:  68.0 in Accession #:    [id number]      Weight:       164.9 lb Date of Birth:  [birth year]        BSA:          1.88 m Patient Age:    10 years        BP:           119/76 mmHg Patient Gender: M               HR:           77 bpm. Exam Location:  Inpatient Procedure: 2D Echo, Color Doppler, Cardiac Doppler and Saline Contrast Bubble             Study Indications:    Stroke  History:        Patient has prior history of Echocardiogram examinations, most                 recent 09/28/2013. CAD; Risk Factors:Dyslipidemia.  Sonographer:    Raquel Sarna Senior RDCS Referring Phys: [place]  1. Left ventricular ejection fraction, by visual estimation, is 60 to 65%. The left ventricle has normal function. There is no left ventricular hypertrophy. Normal diastolic function  2. Global right ventricle has normal systolic function.The right ventricular size is normal. No increase in right ventricular wall thickness.  3. Left atrial size was normal.  4. Right atrial size was normal.  5. The mitral valve is normal in structure. Trivial mitral valve regurgitation.  6. The tricuspid valve is normal in structure.  7. The aortic valve is tricuspid. Aortic valve regurgitation is not visualized. No evidence of aortic valve stenosis.  8. The pulmonic valve was not well visualized. Pulmonic valve regurgitation is not visualized.  9. TR signal is inadequate for assessing pulmonary artery systolic pressure. 10. The inferior vena cava is dilated in size with >50% respiratory variability, suggesting right atrial pressure of 8 mmHg. 11. There is mild dilatation of the aortic root measuring 40 mm. 12. Saline contrast bubble study was negative, with no evidence of any interatrial shunt. FINDINGS  Left Ventricle: Left ventricular ejection fraction, by visual estimation, is 60 to 65%. The left ventricle has normal function. The left ventricle has no regional wall motion abnormalities. There is no left ventricular hypertrophy. Left ventricular diastolic parameters were normal. Right Ventricle: The right ventricular size is normal. No increase in right ventricular wall thickness. Global RV systolic function is has normal systolic function. Left Atrium: Left atrial size was normal in size. Right Atrium: Right atrial size was normal in size Pericardium: Trivial pericardial  effusion is present. Mitral Valve: The mitral valve is normal in structure. Trivial mitral valve regurgitation. Tricuspid Valve: The tricuspid valve is normal in structure. Tricuspid valve regurgitation is not demonstrated. Aortic Valve: The aortic valve is tricuspid. Aortic valve regurgitation is not visualized. The aortic valve is structurally normal, with no evidence of sclerosis or stenosis. Pulmonic Valve: The pulmonic valve was not well visualized. Pulmonic valve regurgitation is not visualized. Pulmonic regurgitation is not visualized. Aorta: Aortic dilatation noted. There is mild dilatation of the aortic root measuring 40 mm. Venous: The inferior vena cava is dilated in size with greater than 50% respiratory variability, suggesting right atrial pressure of 8 mmHg. IAS/Shunts: No atrial level shunt detected by color flow Doppler. Agitated saline contrast was given intravenously to evaluate for intracardiac shunting. Valsalva maneuver was used. Saline contrast bubble study was negative, with  no evidence of any interatrial shunt.  LEFT VENTRICLE PLAX 2D LVIDd:         4.70 cm  Diastology LVIDs:         3.20 cm  LV e' lateral:   10.10 cm/s LV PW:         0.80 cm  LV E/e' lateral: 7.5 LV IVS:        0.80 cm  LV e' medial:    8.59 cm/s LVOT diam:     2.30 cm  LV E/e' medial:  8.8 LV SV:         61 ml LV SV Index:   32.25 LVOT Area:     4.15 cm  RIGHT VENTRICLE RV S prime:     13.40 cm/s TAPSE (M-mode): 2.5 cm LEFT ATRIUM             Index       RIGHT ATRIUM           Index LA diam:        2.70 cm 1.43 cm/m  RA Area:     14.90 cm LA Vol (A2C):   40.9 ml 21.70 ml/m RA Volume:   34.20 ml  18.16 ml/m LA Vol (A4C):   36.2 ml 19.23 ml/m LA Biplane Vol: 41.6 ml 22.09 ml/m  AORTIC VALVE LVOT Vmax:   95.80 cm/s LVOT Vmean:  63.800 cm/s LVOT VTI:    0.193 m  AORTA Ao Root diam: 4.00 cm Ao Asc diam:  3.50 cm MITRAL VALVE MV Area (PHT): 2.99 cm              SHUNTS MV PHT:        73.66 msec            Systemic VTI:   0.19 m MV Decel Time: 254 msec              Systemic Diam: 2.30 cm MV E velocity: 75.80 cm/s  103 cm/s MV A velocity: 105.00 cm/s 70.3 cm/s MV E/A ratio:  0.72        1.5  Oswaldo Milian MD Electronically signed by Oswaldo Milian MD Signature Date/Time: 09/08/2019/2:01:17 PM    Final    CT HEAD CODE STROKE WO CONTRAST  Result Date: 09/07/2019 CLINICAL DATA:  Code stroke. This study identified as missing a report at 1502 hours on 09/07/2019. 69 year old male with code stroke presentation. EXAM: CT HEAD WITHOUT CONTRAST TECHNIQUE: Contiguous axial images were obtained from the base of the skull through the vertex without intravenous contrast. COMPARISON:  Subsequent CTA and CTP today reported separately. FINDINGS: Brain: Normal cerebral volume. No midline shift, mass effect, or evidence of intracranial mass lesion. No acute intracranial hemorrhage identified. No ventriculomegaly. Gray-white matter differentiation is within normal limits throughout the brain. No cortically based acute infarct identified. Vascular: No suspicious intracranial vascular hyperdensity. Skull: No acute osseous abnormality identified. Sinuses/Orbits: Visualized paranasal sinuses and mastoids are well pneumatized. Other: No acute orbit or scalp soft tissue findings. ASPECTS [facility] Stroke Program Early CT Score) Total score (0-10 with 10 being normal): 10 IMPRESSION: Negative noncontrast head CT. ASPECTS 10. This case was discussed by telephone with Dr. Rory Percy 1502 hours - see CTA and CTP report. Electronically Signed   By: Genevie Ann M.D.   On: 09/07/2019 15:15   Cerebral Angio 09/07/2019 S/P LT common carotid arteriogram followed by complete revascularization of occluded sup division M2 seg of LT MCA  With x 1 pass with 16mm x 33 mm  trevoprovue retriever achieving a TICI 3 revascularization.  Transthoracic Echocardiogram with Bubble Study 09/08/2019 IMPRESSIONS  1. Left ventricular ejection fraction, by visual estimation, is 60 to  65%. The left ventricle has normal function. There is no left ventricular hypertrophy. Normal diastolic function  2. Global right ventricle has normal systolic function.The right ventricular size is normal. No increase in right ventricular wall thickness.  3. Left atrial size was normal.  4. Right atrial size was normal.  5. The mitral valve is normal in structure. Trivial mitral valve regurgitation.  6. The tricuspid valve is normal in structure.  7. The aortic valve is tricuspid. Aortic valve regurgitation is not visualized. No evidence of aortic valve stenosis.  8. The pulmonic valve was not well visualized. Pulmonic valve regurgitation is not visualized.  9. TR signal is inadequate for assessing pulmonary artery systolic pressure. 10. The inferior vena cava is dilated in size with >50% respiratory variability, suggesting right atrial pressure of 8 mmHg. 11. There is mild dilatation of the aortic root measuring 40 mm. 12. Saline contrast bubble study was negative, with no evidence of any interatrial shunt.   Bilateral Lower Extremity Venous Dopplers - no DVT   PHYSICAL EXAM Pleasant middle-aged Caucasian male not in distress.  Afebrile. Head is nontraumatic. Neck is supple without bruit.    Cardiac exam no murmur or gallop. Lungs are clear to auscultation. Distal pulses are well  felt Neurological Exam :  Awake alert moderate expressive aphasia with significant word hesitancy and paraphasic errors as well as missing syllables.  Able to name with missing syllables. Able to repeat 3-word sentences with missing syllables, not able to repeat complicated sentences.  Good comprehension.  Mild dysarthria.  Extraocular movements full range without nystagmus.  Blinks to threat bilaterally.  Follows commands well.  Mild right lower facial weakness.  Tongue midline.  Motor system exam symmetric upper and lower extremity strength.  No drift.  No focal weakness.  Sensation, coordination intact and gait not  tested.   .ASSESSMENT/PLAN Mr. Lenn Srinivasan. is a 69 y.o. male with history of hyperlipidemia and enlarged thoracic aortic aneurysm presenting with right-sided facial droop, aphasia, right-sided weakness.  Received IV TPA 09/07/2019 at 1434. Taken to IR for left M2 occlusion.   Stroke:  left MCA territory infarct s/p tPA and IR w/ TICI3 revascularization, embolic secondary to unknown source  Code Stroke CT head No acute abnormality. ASPECTS 10.     CTA head & neck ELVO L MCA M2 branch just beyond bifurcation. Mild atherosclerosis  CT perfusion 24 mL core and 71 mL penumbra anterior L MCA territory   Cerebral angio occluded superior division L MCA M2 w/ TICI3 revascularization  MRI - 09/08/19 - Scattered small acute left cerebral infarcts greatest in the frontal operculum. Abnormal appearance of the right transverse sinus most suggestive of occlusion (versus very slow flow due to a distal stenosis).   2D Echo / bubble - EF 60 - 65%. No cardiac source of emboli identified.  LE venous doppler - no DVT  Recommend TEE and loop recorder for embolic work up  LDL 88  HgbA1c 5.4  UDS pending  SCDs for VTE prophylaxis  No antithrombotic prior to admission, now on ASA 81 and plavix 75 DAPT for 3 weeks and then ASA alone.   Therapy recommendations: none  Disposition:  pending   Hyperlipidemia  Home meds:  No statin   On pravastatin 20 (not intensive statin) given LDL level and pt hx of  nausea with statins  LDL 88, goal < 70  Continue statin at discharge  Other Stroke Risk Factors  Advanced age  ETOH use,  advised to drink no more than 2 drink(s) a day  Hx Substance abuse - marijuana  Hx stroke/TIA - unable to find records related to Intracerebral hematoma [facility]) at [facility]. Pt denies previous hx of stroke   Coronary artery disease  Other Active Problems  Mild bipolar d/o  Thoracic Aortic aneurysm - stable from imaging back in the [facility]  day # 2  This patient is critically ill and at significant risk of neurological worsening, death and care requires constant monitoring of vital signs, hemodynamics,respiratory and cardiac monitoring, extensive review of multiple databases, frequent neurological assessment, discussion with family, other specialists and medical decision making of high complexity.I have made any additions or clarifications directly to the above note. I spent 35 minutes of neurocritical care time  in the care of  this patient.  Rosalin Hawking, MD PhD Stroke Neurology 09/09/2019 7:51 PM  To contact Stroke Continuity provider, please refer to [email or web address] After hours, contact General Neurology

## 2019-09-10 ENCOUNTER — Encounter (HOSPITAL_COMMUNITY): Payer: Self-pay | Admitting: Student in an Organized Health Care Education/Training Program

## 2019-09-10 ENCOUNTER — Other Ambulatory Visit: Payer: Self-pay

## 2019-09-10 DIAGNOSIS — I63312 Cerebral infarction due to thrombosis of left middle cerebral artery: Secondary | ICD-10-CM

## 2019-09-10 LAB — RAPID URINE DRUG SCREEN, HOSP PERFORMED
Amphetamines: NOT DETECTED
Barbiturates: NOT DETECTED
Benzodiazepines: NOT DETECTED
Cocaine: NOT DETECTED
Opiates: NOT DETECTED
Tetrahydrocannabinol: NOT DETECTED

## 2019-09-10 LAB — CBC
HCT: 42.1 % (ref 39.0–52.0)
Hemoglobin: 14.1 g/dL (ref 13.0–17.0)
MCH: 30.6 pg (ref 26.0–34.0)
MCHC: 33.5 g/dL (ref 30.0–36.0)
MCV: 91.3 fL (ref 80.0–100.0)
Platelets: 160 10*3/uL (ref 150–400)
RBC: 4.61 MIL/uL (ref 4.22–5.81)
RDW: 12.6 % (ref 11.5–15.5)
WBC: 5.9 10*3/uL (ref 4.0–10.5)
nRBC: 0 % (ref 0.0–0.2)

## 2019-09-10 LAB — BASIC METABOLIC PANEL
Anion gap: 9 (ref 5–15)
BUN: 12 mg/dL (ref 8–23)
CO2: 23 mmol/L (ref 22–32)
Calcium: 8.7 mg/dL — ABNORMAL LOW (ref 8.9–10.3)
Chloride: 106 mmol/L (ref 98–111)
Creatinine, Ser: 1.01 mg/dL (ref 0.61–1.24)
GFR calc Af Amer: >60 mL/min (ref >60)
GFR calc non Af Amer: >60 mL/min (ref >60)
Glucose, Bld: 152 mg/dL — ABNORMAL HIGH (ref 70–99)
Potassium: 3.5 mmol/L (ref 3.5–5.1)
Sodium: 138 mmol/L (ref 135–145)

## 2019-09-10 NOTE — Plan of Care (Signed)
  Problem: Education: Goal: Knowledge of disease or condition will improve Outcome: Progressing Goal: Knowledge of secondary prevention will improve Outcome: Progressing Goal: Knowledge of patient specific risk factors addressed and post discharge goals established will improve Outcome: Progressing Goal: Individualized Educational Video(s) Outcome: Progressing   Problem: Coping: Goal: Will verbalize positive feelings about self Outcome: Progressing Goal: Will identify appropriate support needs Outcome: Progressing   Problem: Health Behavior/Discharge Planning: Goal: Ability to manage health-related needs will improve Outcome: Progressing   Problem: Self-Care: Goal: Ability to participate in self-care as condition permits will improve Outcome: Progressing Goal: Verbalization of feelings and concerns over difficulty with self-care will improve Outcome: Progressing Goal: Ability to communicate needs accurately will improve Outcome: Progressing   Problem: Ischemic Stroke/TIA Tissue Perfusion: Goal: Complications of ischemic stroke/TIA will be minimized Outcome: Progressing

## 2019-09-10 NOTE — Progress Notes (Signed)
STROKE TEAM PROGRESS NOTE   INTERVAL HISTORY Pt lying in bed, still has expressive aphasia, no significant change from yesterday. Still able to write fluently. Pending TEE and loop.    Vitals:   09/10/19 0400 09/10/19 0500 09/10/19 0600 09/10/19 0800  BP: 97/75 115/81 122/80 96/62  Pulse:  75 68 66  Resp: 16 14 11 16   Temp: 98.8 F (37.1 C)   97.9 F (36.6 C)  TempSrc: Oral   Oral  SpO2:  93% 97% 97%  Weight:        CBC:  Recent Labs  Lab 09/07/19 1416 09/08/19 0418 09/09/19 0541 09/10/19 0815  WBC 5.7 8.1 6.7 5.9  NEUTROABS 3.0 6.2  --   --   HGB 15.4 13.7 13.6 14.1  HCT 48.1 42.1 42.0 42.1  MCV 95.6 95.0 94.2 91.3  PLT 184 167 156 0000000    Basic Metabolic Panel:  Recent Labs  Lab 09/09/19 0541 09/10/19 0815  NA 141 138  K 3.6 3.5  CL 110 106  CO2 23 23  GLUCOSE 99 152*  BUN 12 12  CREATININE 0.93 1.01  CALCIUM 8.4* 8.7*   Lipid Panel:     Component Value Date/Time   CHOL 158 09/08/2019 0418   TRIG 66 09/08/2019 0418   HDL 57 09/08/2019 0418   CHOLHDL 2.8 09/08/2019 0418   VLDL 13 09/08/2019 0418   LDLCALC 88 09/08/2019 0418   HgbA1c:  Lab Results  Component Value Date   HGBA1C 5.4 09/08/2019   Urine Drug Screen:     Component Value Date/Time   LABOPIA NONE DETECTED 09/09/2019 2311   COCAINSCRNUR NONE DETECTED 09/09/2019 2311   LABBENZ NONE DETECTED 09/09/2019 2311   AMPHETMU NONE DETECTED 09/09/2019 2311   THCU NONE DETECTED 09/09/2019 2311   LABBARB NONE DETECTED 09/09/2019 2311    Alcohol Level No results found for: ETH  IMAGING past 48 hours MR BRAIN WO CONTRAST  Result Date: 09/08/2019 CLINICAL DATA:  Stroke follow-up. Status post endovascular revascularization of left M2 occlusion. Presenting symptoms of right hemiparesis and aphasia. EXAM: MRI HEAD WITHOUT CONTRAST TECHNIQUE: Multiplanar, multiecho pulse sequences of the brain and surrounding structures were obtained without intravenous contrast. COMPARISON:  Head CT, CTA, and CTP  09/07/2019 FINDINGS: Brain: There are scattered small acute left MCA territory infarcts in the left frontal lobe with the largest located in the frontal operculum where there is a small amount of petechial hemorrhage. A few punctate acute cortical infarcts are also noted in the medial aspect of the left superior frontal gyrus posteriorly as well as in the left parietal lobe. There is no intracranial mass effect or extra-axial fluid collection. The ventricles are normal in size. No significant chronic white matter disease is seen for age. Vascular: Major intracranial arterial flow voids are preserved. There is abnormal T2 hyperintensity throughout the nondominant right transverse sinus which did not enhance on CTA suggesting occlusion (versus very slow flow such as from a distal stenosis), however there is no associated regional infarct or hemorrhage. Skull and upper cervical spine: Unremarkable bone marrow signal. Sinuses/Orbits: Unremarkable orbits. Paranasal sinuses and mastoid air cells are clear. Other: None. IMPRESSION: 1. Scattered small acute left cerebral infarcts greatest in the frontal operculum. 2. Abnormal appearance of the right transverse sinus most suggestive of occlusion (versus very slow flow due to a distal stenosis). No associated acute or chronic infarct or hemorrhage. Electronically Signed   By: Logan Bores M.D.   On: 09/08/2019 15:17   ECHOCARDIOGRAM COMPLETE  BUBBLE STUDY  Result Date: 09/08/2019   ECHOCARDIOGRAM REPORT   Patient Name:   Bryce Logan. Date of Exam: 09/08/2019 Medical Rec #:  [id number]       Height:       68.0 in Accession #:    [id number]      Weight:       164.9 lb Date of Birth:  12/14/1950        BSA:          1.88 m Patient Age:    70 years        BP:           119/76 mmHg Patient Gender: M               HR:           77 bpm. Exam Location:  Inpatient Procedure: 2D Echo, Color Doppler, Cardiac Doppler and Saline Contrast Bubble            Study Indications:    Stroke   History:        Patient has prior history of Echocardiogram examinations, most                 recent 09/28/2013. CAD; Risk Factors:Dyslipidemia.  Sonographer:    Raquel Sarna Senior RDCS Referring Phys: [place]  1. Left ventricular ejection fraction, by visual estimation, is 60 to 65%. The left ventricle has normal function. There is no left ventricular hypertrophy. Normal diastolic function  2. Global right ventricle has normal systolic function.The right ventricular size is normal. No increase in right ventricular wall thickness.  3. Left atrial size was normal.  4. Right atrial size was normal.  5. The mitral valve is normal in structure. Trivial mitral valve regurgitation.  6. The tricuspid valve is normal in structure.  7. The aortic valve is tricuspid. Aortic valve regurgitation is not visualized. No evidence of aortic valve stenosis.  8. The pulmonic valve was not well visualized. Pulmonic valve regurgitation is not visualized.  9. TR signal is inadequate for assessing pulmonary artery systolic pressure. 10. The inferior vena cava is dilated in size with >50% respiratory variability, suggesting right atrial pressure of 8 mmHg. 11. There is mild dilatation of the aortic root measuring 40 mm. 12. Saline contrast bubble study was negative, with no evidence of any interatrial shunt. FINDINGS  Left Ventricle: Left ventricular ejection fraction, by visual estimation, is 60 to 65%. The left ventricle has normal function. The left ventricle has no regional wall motion abnormalities. There is no left ventricular hypertrophy. Left ventricular diastolic parameters were normal. Right Ventricle: The right ventricular size is normal. No increase in right ventricular wall thickness. Global RV systolic function is has normal systolic function. Left Atrium: Left atrial size was normal in size. Right Atrium: Right atrial size was normal in size Pericardium: Trivial pericardial effusion is present. Mitral  Valve: The mitral valve is normal in structure. Trivial mitral valve regurgitation. Tricuspid Valve: The tricuspid valve is normal in structure. Tricuspid valve regurgitation is not demonstrated. Aortic Valve: The aortic valve is tricuspid. Aortic valve regurgitation is not visualized. The aortic valve is structurally normal, with no evidence of sclerosis or stenosis. Pulmonic Valve: The pulmonic valve was not well visualized. Pulmonic valve regurgitation is not visualized. Pulmonic regurgitation is not visualized. Aorta: Aortic dilatation noted. There is mild dilatation of the aortic root measuring 40 mm. Venous: The inferior vena cava is dilated in size with greater than  50% respiratory variability, suggesting right atrial pressure of 8 mmHg. IAS/Shunts: No atrial level shunt detected by color flow Doppler. Agitated saline contrast was given intravenously to evaluate for intracardiac shunting. Valsalva maneuver was used. Saline contrast bubble study was negative, with no evidence of any interatrial shunt.  LEFT VENTRICLE PLAX 2D LVIDd:         4.70 cm  Diastology LVIDs:         3.20 cm  LV e' lateral:   10.10 cm/s LV PW:         0.80 cm  LV E/e' lateral: 7.5 LV IVS:        0.80 cm  LV e' medial:    8.59 cm/s LVOT diam:     2.30 cm  LV E/e' medial:  8.8 LV SV:         61 ml LV SV Index:   32.25 LVOT Area:     4.15 cm  RIGHT VENTRICLE RV S prime:     13.40 cm/s TAPSE (M-mode): 2.5 cm LEFT ATRIUM             Index       RIGHT ATRIUM           Index LA diam:        2.70 cm 1.43 cm/m  RA Area:     14.90 cm LA Vol (A2C):   40.9 ml 21.70 ml/m RA Volume:   34.20 ml  18.16 ml/m LA Vol (A4C):   36.2 ml 19.23 ml/m LA Biplane Vol: 41.6 ml 22.09 ml/m  AORTIC VALVE LVOT Vmax:   95.80 cm/s LVOT Vmean:  63.800 cm/s LVOT VTI:    0.193 m  AORTA Ao Root diam: 4.00 cm Ao Asc diam:  3.50 cm MITRAL VALVE MV Area (PHT): 2.99 cm              SHUNTS MV PHT:        73.66 msec            Systemic VTI:  0.19 m MV Decel Time: 254 msec               Systemic Diam: 2.30 cm MV E velocity: 75.80 cm/s  103 cm/s MV A velocity: 105.00 cm/s 70.3 cm/s MV E/A ratio:  0.72        1.5  Oswaldo Milian MD Electronically signed by Oswaldo Milian MD Signature Date/Time: 09/08/2019/2:01:17 PM    Final    VAS [place] LOWER EXTREMITY VENOUS (DVT)  Result Date: 09/09/2019  Lower Venous Study Indications: Stroke.  Comparison Study: No prior study on file Performing Technologist: Sharion Dove RVS  Examination Guidelines: A complete evaluation includes B-mode imaging, spectral Doppler, color Doppler, and power Doppler as needed of all accessible portions of each vessel. Bilateral testing is considered an integral part of a complete examination. Limited examinations for reoccurring indications may be performed as noted.  +---------+---------------+---------+-----------+----------+--------------+ RIGHT    CompressibilityPhasicitySpontaneityPropertiesThrombus Aging +---------+---------------+---------+-----------+----------+--------------+ CFV      Full           Yes      Yes                                 +---------+---------------+---------+-----------+----------+--------------+ SFJ      Full                                                        +---------+---------------+---------+-----------+----------+--------------+  FV Prox  Full                                                        +---------+---------------+---------+-----------+----------+--------------+ FV Mid   Full                                                        +---------+---------------+---------+-----------+----------+--------------+ FV DistalFull                                                        +---------+---------------+---------+-----------+----------+--------------+ PFV      Full                                                        +---------+---------------+---------+-----------+----------+--------------+ POP      Full            Yes      Yes                                 +---------+---------------+---------+-----------+----------+--------------+ PTV      Full                                                        +---------+---------------+---------+-----------+----------+--------------+ PERO     Full                                                        +---------+---------------+---------+-----------+----------+--------------+   +---------+---------------+---------+-----------+----------+--------------+ LEFT     CompressibilityPhasicitySpontaneityPropertiesThrombus Aging +---------+---------------+---------+-----------+----------+--------------+ CFV      Full           Yes      Yes                                 +---------+---------------+---------+-----------+----------+--------------+ SFJ      Full                                                        +---------+---------------+---------+-----------+----------+--------------+ FV Prox  Full                                                        +---------+---------------+---------+-----------+----------+--------------+   FV Mid   Full                                                        +---------+---------------+---------+-----------+----------+--------------+ FV DistalFull                                                        +---------+---------------+---------+-----------+----------+--------------+ PFV      Full                                                        +---------+---------------+---------+-----------+----------+--------------+ POP      Full           Yes      Yes                                 +---------+---------------+---------+-----------+----------+--------------+ PTV      Full                                                        +---------+---------------+---------+-----------+----------+--------------+ PERO     Full                                                         +---------+---------------+---------+-----------+----------+--------------+     Summary: Right: There is no evidence of deep vein thrombosis in the lower extremity. Left: There is no evidence of deep vein thrombosis in the lower extremity.  *See table(s) above for measurements and observations.    Preliminary    Cerebral Angio 09/07/2019 S/P LT common carotid arteriogram followed by complete revascularization of occluded sup division M2 seg of LT MCA  With x 1 pass with 67mm x 33 mm trevoprovue retriever achieving a TICI 3 revascularization.  Transthoracic Echocardiogram with Bubble Study 09/08/2019 IMPRESSIONS  1. Left ventricular ejection fraction, by visual estimation, is 60 to 65%. The left ventricle has normal function. There is no left ventricular hypertrophy. Normal diastolic function  2. Global right ventricle has normal systolic function.The right ventricular size is normal. No increase in right ventricular wall thickness.  3. Left atrial size was normal.  4. Right atrial size was normal.  5. The mitral valve is normal in structure. Trivial mitral valve regurgitation.  6. The tricuspid valve is normal in structure.  7. The aortic valve is tricuspid. Aortic valve regurgitation is not visualized. No evidence of aortic valve stenosis.  8. The pulmonic valve was not well visualized. Pulmonic valve regurgitation is not visualized.  9. TR signal is inadequate for assessing pulmonary artery systolic pressure. 10. The inferior vena cava is dilated in size with >50% respiratory  variability, suggesting right atrial pressure of 8 mmHg. 11. There is mild dilatation of the aortic root measuring 40 mm. 12. Saline contrast bubble study was negative, with no evidence of any interatrial shunt.   Bilateral Lower Extremity Venous Dopplers - no DVT   PHYSICAL EXAM Pleasant middle-aged Caucasian male not in distress.  Afebrile. Head is nontraumatic. Neck is supple without bruit.    Cardiac exam no  murmur or gallop. Lungs are clear to auscultation. Distal pulses are well  felt Neurological Exam :  Awake alert moderate expressive aphasia with significant word hesitancy and paraphasic errors as well as missing syllables.  Able to name with missing syllables. Able to repeat 3-word sentences with missing syllables, not able to repeat complicated sentences.  Good comprehension and able to write fluently. Clinical picture consistent with aphemia. Mild dysarthria.  Extraocular movements full range without nystagmus.  Blinks to threat bilaterally.  Follows commands well.  Mild right lower facial weakness.  Tongue midline.  Motor system exam symmetric upper and lower extremity strength.  No drift.  No focal weakness.  Sensation, coordination intact and gait not tested.   .ASSESSMENT/PLAN [name]. is a 69 y.o. male with history of hyperlipidemia and enlarged thoracic aortic aneurysm presenting with right-sided facial droop, aphasia, right-sided weakness.  Received IV TPA 09/07/2019 at 1434. Taken to IR for left M2 occlusion.   Stroke:  left MCA territory infarct s/p tPA and IR w/ TICI3 revascularization, embolic secondary to unknown source  Resultant - aphemia  Code Stroke CT head No acute abnormality. ASPECTS 10.     CTA head & neck ELVO L MCA M2 branch just beyond bifurcation. Mild atherosclerosis  CT perfusion 24 mL core and 71 mL penumbra anterior L MCA territory   Cerebral angio occluded superior division L MCA M2 w/ TICI3 revascularization  MRI - 09/08/19 - Scattered small acute left cerebral infarcts greatest in the frontal operculum. Abnormal appearance of the right transverse sinus most suggestive of occlusion (versus very slow flow due to a distal stenosis).   2D Echo / bubble - EF 60 - 65%. No cardiac source of emboli identified.  LE venous doppler - no DVT  Recommend TEE and loop recorder for embolic work up - planned for Monday (ordered)  LDL 88  HgbA1c 5.4  UDS -  negative  SCDs for VTE prophylaxis  No antithrombotic prior to admission, now on ASA 81 and plavix 75 DAPT for 3 weeks and then ASA alone.   Therapy recommendations: none  Disposition:  pending   Hyperlipidemia  Home meds:  No statin   On pravastatin 20 (not intensive statin) given LDL level and pt hx of nausea with statins  LDL 88, goal < 70  Continue statin at discharge  Other Stroke Risk Factors  Advanced age  ETOH use,  advised to drink no more than 2 drink(s) a day  Hx Substance abuse - marijuana  Hx stroke/TIA - unable to find records related to Intracerebral hematoma [facility]) at [facility]. Pt denies previous hx of stroke   Coronary artery disease  Other Active Problems  Mild bipolar d/o  Thoracic Aortic aneurysm - stable from imaging back in the summer  Mildly low BP at times - SBP - 90's - asymptomatic, no hx of htn - no BP meds   Hospital day # 3  Rosalin Hawking, MD PhD Stroke Neurology 09/10/2019 10:45 AM    To contact Stroke Continuity provider, please refer to [email or web address] After  hours, contact General Neurology

## 2019-09-11 LAB — MRSA PCR SCREENING: MRSA by PCR: NEGATIVE

## 2019-09-11 NOTE — Consult Note (Signed)
    [facility] has been requested to perform a transesophageal echocardiogram on Bryce Logan. for cryptogenic stroke.  After careful review of history and examination, the risks and benefits of transesophageal echocardiogram have been explained including risks of esophageal damage, perforation (1:10,000 risk), bleeding, pharyngeal hematoma as well as other potential complications associated with conscious sedation including aspiration, arrhythmia, respiratory failure and death. Alternatives to treatment were discussed, questions were answered. Patient is willing to proceed.   The patient has aphasia 2/2 his stroke, though communicates well via writing.  He has had knee surgery and an EGD historically without anesthesia difficulties.  He denies any dysphagia now or prior to his stroke, no known esophageal abnormalities.  He takes Xanax daily and Ambien nightly, no pain medicines.  Baldwin Jamaica, PA-C  09/11/2019 8:44 AM

## 2019-09-11 NOTE — TOC Initial Note (Signed)
Transition of Care (TOC) - Initial/Assessment Note    Patient Details  Name: Bryce Logan. MRN: [id number] Date of Birth: [birth year]  Transition of Care [facility]) CM/SW Contact:    Pollie Friar, RN Phone Number: 09/11/2019, 3:04 PM  Clinical Narrative:                 Pt continues with aphasia and outpatient ST recommended. CM met with the patient and he chose [employer]. Orders in Epic and information on the AVS. Pt has assistance at home.  TOC following for further d/c needs.   Expected Discharge Plan: OP Rehab Barriers to Discharge: Continued Medical Work up   Patient Goals and CMS Choice     Choice offered to / list presented to : Patient  Expected Discharge Plan and Services Expected Discharge Plan: OP Rehab   Discharge Planning Services: CM Consult   Living arrangements for the past 2 months: Single Family Home                                      Prior Living Arrangements/Services Living arrangements for the past 2 months: Single Family Home Lives with:: Significant Other Patient language and need for interpreter reviewed:: Yes Do you feel safe going back to the place where you live?: Yes      Need for Family Participation in Patient Care: Yes (Comment) Care giver support system in place?: Yes (comment)   Criminal Activity/Legal Involvement Pertinent to Current Situation/Hospitalization: No - Comment as needed  Activities of Daily Living Home Assistive Devices/Equipment: None ADL Screening (condition at time of admission) Patient's cognitive ability adequate to safely complete daily activities?: Yes Is the patient deaf or have difficulty hearing?: No Does the patient have difficulty seeing, even when wearing glasses/contacts?: No Does the patient have difficulty concentrating, remembering, or making decisions?: No Patient able to express need for assistance with ADLs?: No Does the patient have difficulty dressing or bathing?:  No Independently performs ADLs?: Yes (appropriate for developmental age) Does the patient have difficulty walking or climbing stairs?: No Weakness of Legs: None Weakness of Arms/Hands: None  Permission Sought/Granted                  Emotional Assessment Appearance:: Appears stated age   Affect (typically observed): Accepting Orientation: : Oriented to Self, Oriented to Place, Oriented to  Time, Oriented to Situation   Psych Involvement: No (comment)  Admission diagnosis:  Stroke (cerebrum) (HCC) [I63.9] Acute ischemic stroke [facility]) [I63.9] Cerebrovascular accident (CVA) due to thrombosis of left middle cerebral artery ([place]) [I63.312] Cerebrovascular accident (CVA), unspecified mechanism ([place]) [I63.9] Middle cerebral artery embolism, left [I66.02] Patient Active Problem List   Diagnosis Date Noted  . Expressive aphasia 09/08/2019  . Cryptogenic stroke ([place]) 09/07/2019  . Middle cerebral artery embolism, left 09/07/2019  . Dyspnea 02/03/2019  . Chest pain 02/03/2019  . CAD (coronary artery disease) 02/03/2019  . Thoracic aortic aneurysm ([place]) 02/03/2019  . Hyperlipidemia 02/03/2019   PCP:  Kathyrn Lass, MD Pharmacy:   [id number]: [phone]: [phone]    Social Determinants of Health (SDOH) Interventions    Readmission Risk Interventions No flowsheet data found.

## 2019-09-11 NOTE — Care Management Important Message (Signed)
-  Important Message  Patient Details  Name: Bryce Logan. MRN: [id number] Date of Birth: [birth year]   Medicare Important Message Given:  Yes     Bryce Logan 09/11/2019, 4:05 PM

## 2019-09-11 NOTE — Progress Notes (Signed)
STROKE TEAM PROGRESS NOTE   INTERVAL HISTORY No family at bedside. Aphemia is slightly better. Pending TEE and loop.     Vitals:   09/10/19 2302 09/11/19 0559 09/11/19 0849 09/11/19 1219  BP: 112/65 97/72 129/85 101/69  Pulse: 81 75 74 70  Resp: 18 18 16 20   Temp: 98.3 F (36.8 C) 98.3 F (36.8 C) 98.7 F (37.1 C) 97.9 F (36.6 C)  TempSrc: Oral Oral Oral Oral  SpO2: 98% 95% 96% 95%  Weight:      Height:        CBC:  Recent Labs  Lab 09/07/19 1416 09/08/19 0418 09/09/19 0541 09/10/19 0815  WBC 5.7 8.1 6.7 5.9  NEUTROABS 3.0 6.2  --   --   HGB 15.4 13.7 13.6 14.1  HCT 48.1 42.1 42.0 42.1  MCV 95.6 95.0 94.2 91.3  PLT 184 167 156 0000000    Basic Metabolic Panel:  Recent Labs  Lab 09/09/19 0541 09/10/19 0815  NA 141 138  K 3.6 3.5  CL 110 106  CO2 23 23  GLUCOSE 99 152*  BUN 12 12  CREATININE 0.93 1.01  CALCIUM 8.4* 8.7*    IMAGING past 48 hours No results found.    PHYSICAL EXAM  Pleasant middle-aged Caucasian male not in distress.  Afebrile. Head is nontraumatic. Neck is supple without bruit.    Cardiac exam no murmur or gallop. Lungs are clear to auscultation. Distal pulses are well  felt Neurological Exam :  Awake alert moderate expressive aphasia with significant word hesitancy and paraphasic errors as well as missing syllables.  Able to name with missing syllables. Able to repeat 3-word sentences with missing syllables, not able to repeat complicated sentences.  Good comprehension and able to write fluently. Clinical picture consistent with aphemia. Mild dysarthria.  Extraocular movements full range without nystagmus.  Blinks to threat bilaterally.  Follows commands well.  Mild right lower facial weakness.  Tongue midline.  Motor system exam symmetric upper and lower extremity strength.  No drift.  No focal weakness.  Sensation, coordination intact and gait not tested.  ASSESSMENT/PLAN [name]. is a 69 y.o. male with history of hyperlipidemia  and enlarged thoracic aortic aneurysm presenting with right-sided facial droop, aphasia, right-sided weakness.  Received IV TPA 09/07/2019 at 1434. Taken to IR for left M2 occlusion.   Stroke:  left MCA territory infarct s/p tPA and IR w/ TICI3 revascularization, embolic secondary to unknown source  Resultant - aphemia  Code Stroke CT head No acute abnormality. ASPECTS 10.     CTA head & neck ELVO L MCA M2 branch just beyond bifurcation. Mild atherosclerosis  CT perfusion 24 mL core and 71 mL penumbra anterior L MCA territory   Cerebral angio occluded superior division L MCA M2 w/ TICI3 revascularization  MRI - 09/08/19 - Scattered small acute left cerebral infarcts greatest in the frontal operculum. Abnormal appearance of the right transverse sinus most suggestive of occlusion (versus very slow flow due to a distal stenosis).   2D Echo / bubble - EF 60 - 65%. No cardiac source of emboli identified.  LE venous doppler - no DVT  Recommend TEE and loop recorder for embolic work up - planned for Tuesday (NPO order adjusted)  LDL 88  HgbA1c 5.4  UDS - negative  SCDs for VTE prophylaxis  No antithrombotic prior to admission, now on ASA 81 and plavix 75 DAPT for 3 weeks and then ASA alone.   Therapy recommendations: OP  SLP  Disposition:  Return home after TEE tomorrow  Hyperlipidemia  Home meds:  No statin   On pravastatin 20 (not intensive statin) given LDL level and pt hx of nausea with statins  LDL 88, goal < 70  Continue statin at discharge  Other Stroke Risk Factors  Advanced age  ETOH use,  advised to drink no more than 2 drink(s) a day  Hx Substance abuse - marijuana  Hx stroke/TIA - unable to find records related to Intracerebral hematoma [facility] at [facility]. Pt denies previous hx of stroke   Coronary artery disease  Other Active Problems  Mild bipolar d/o  Thoracic Aortic aneurysm - stable from imaging back in the summer  Mildly low BP at  times - SBP - low 100s - asymptomatic, no hx of htn - no BP meds   Hospital day # 4  Rosalin Hawking, MD PhD Stroke Neurology 09/11/2019 1:07 PM    To contact Stroke Continuity provider, please refer to [email or web address] After hours, contact General Neurology

## 2019-09-11 NOTE — Consult Note (Addendum)
ELECTROPHYSIOLOGY CONSULT NOTE  Patient ID: Bryce Logan. MRN: [id number], DOB/AGE: 1951-06-23   Admit date: 09/07/2019 Date of Consult: 09/11/2019  Primary Physician: Kathyrn Lass, MD Primary Cardiologist: Dr. Wynonia Lawman >> Dr. Bettina Gavia Reason for Consultation: Cryptogenic stroke ; recommendations regarding Implantable Loop Recorder, requested by Dr. Erlinda Hong  History of Present Illness Bryce Ent. was admitted on 09/07/2019 with R sided weakness and difficult speech while talking on the phone  He was tx with tPA and brought emergently to IR for revascularization. PMHx noted for CAD (though no reported procedures, unclear to what degree this has been noted), HLD.  Chart makes note of prior ICH though today the patient denies this.   Neurology noted,  left MCA territory infarct s/p tPA and IR w/ TICI3 revascularization, embolic secondary to unknown source.  he has undergone workup for stroke including echocardiogram and carotid angio.  The patient has been monitored on telemetry which has demonstrated sinus rhythm with no arrhythmias.  Inpatient stroke work-up is to be completed with a TEE.   Echocardiogram this admission demonstrated  IMPRESSIONS 1. Left ventricular ejection fraction, by visual estimation, is 60 to 65%. The left ventricle has normal function. There is no left ventricular hypertrophy. Normal diastolic function 2. Global right ventricle has normal systolic function.The right ventricular size is normal. No increase in right ventricular wall thickness. 3. Left atrial size was normal. 4. Right atrial size was normal. 5. The mitral valve is normal in structure. Trivial mitral valve regurgitation. 6. The tricuspid valve is normal in structure. 7. The aortic valve is tricuspid. Aortic valve regurgitation is not visualized. No evidence of aortic valve stenosis. 8. The pulmonic valve was not well visualized. Pulmonic valve regurgitation is not visualized. 9. TR signal is  inadequate for assessing pulmonary artery systolic pressure. 10. The inferior vena cava is dilated in size with >50% respiratory variability, suggesting right atrial pressure of 8 mmHg. 11. There is mild dilatation of the aortic root measuring 40 mm. 12. Saline contrast bubble study was negative, with no evidence of any interatrial shunt.   Lab work is reviewed.  The patient is able to verbalize a few words here/there, primary communication from his is through writing   Prior to admission, the patient denies chest pain, shortness of breath, dizziness, palpitations, or syncope.  He does mention though for a couple years maybe once or  Twice a year he has had a few days of feeling unusually tired//fatigued.  He is recovering from his stroke with plans to home at discharge.   Past Medical History:  Diagnosis Date  . Chronic prostatitis    ELEVATED PSA . ED DR. DAHLSTEDT  . Coronary artery disease   . Depression   . Dizziness 11/11   DR. ROSEN, NORMAL EF, MILD LEAKINESS OF MITRAL VALVE, MILD STIFFNESS OF HEART MUSCLE, MILD ENLARGEMENT OF AORTIC ROOT, REPEAT IN 1 YEAR DR. Radford Pax, NL ETT DR. Radford Pax 12/11  . H/O: GI bleed 2002   DR. HAYES   . Hyperlipidemia   . Insomnia    ON XANAX, DR. Sabra Heck  . Intracerebral hematoma ([place]) 2013   [place]  . Mild bipolar disorder ([place]) 01/2010   DR. PITTMAN DR. [facility] Care, CHR DEPRESSION, FATIGUE OFF MEDS FOR AWHILE, CONTROLLED  . Tubular adenoma 09/17/11   [facility]     Surgical History:  Past Surgical History:  Procedure Laterality Date  . COLONOSCOPY  2002  . IR PERCUTANEOUS ART THROMBECTOMY/INFUSION INTRACRANIAL INC DIAG  ANGIO  09/07/2019  . LEFT KNEE ATHROSCOPY  2002  . LEFT LEG AMBULATORY PHLEBECTOMY  2002  . RADIOLOGY WITH ANESTHESIA N/A 09/07/2019   Procedure: RADIOLOGY WITH ANESTHESIA;  Surgeon: Luanne Bras, MD;  Location: [place] [facility];  Service: Radiology;  Laterality: N/A;     Medications Prior to Admission    Medication Sig Dispense Refill Last Dose  . ALPRAZolam (XANAX) 0.5 MG tablet Take 1 mg by mouth at bedtime.   1 09/06/2019 at Unknown time  . amphetamine-dextroamphetamine (ADDERALL) 10 MG tablet Take 10 mg by mouth See admin instructions. Takes 2.5mg  -5mg  daily as needed per patient   Past Week at Unknown time  . Ascorbic Acid (VITAMIN C) 1000 MG tablet Take 1,000 mg by mouth daily.   09/06/2019 at Unknown time  . Astaxanthin 4 MG CAPS Take 1 capsule by mouth daily.   09/06/2019 at Unknown time  . cetirizine (ZYRTEC) 10 MG tablet Take 10 mg by mouth daily as needed for allergies.   Past Week at Unknown time  . Coenzyme Q10 (CO Q 10) 100 MG CAPS Take 1 capsule by mouth daily.   09/06/2019 at Unknown time  . KRILL OIL PO Take 1 tablet by mouth daily.    Past Month at Unknown time  . Magnesium 300 MG CAPS Take 300 mg by mouth daily.    09/06/2019 at Unknown time  . NON FORMULARY LITHIUM OROTATE   09/06/2019 at Unknown time  . NON FORMULARY Take 1 tablet by mouth daily. Sam-e 400mg  daily   09/06/2019 at Unknown time  . zolpidem (AMBIEN) 10 MG tablet Take 5 mg by mouth at bedtime as needed for sleep. Reports breaking tablet into 1/3 tablet   Past Week at Unknown time    Inpatient Medications:  . aspirin EC  81 mg Oral Daily  . Chlorhexidine Gluconate Cloth  6 each Topical Daily  . clopidogrel  75 mg Oral Daily  . pantoprazole  40 mg Oral QHS  . pravastatin  20 mg Oral q1800    Allergies:  Allergies  Allergen Reactions  . Sulfa Antibiotics     SEVERE FATIGUE AND SIDE EFFECTS    Social History   Socioeconomic History  . Marital status: Divorced    Spouse name: Not on file  . Number of children: Not on file  . Years of education: Not on file  . Highest education level: Not on file  Occupational History  . Not on file  Tobacco Use  . Smoking status: Never Smoker  . Smokeless tobacco: Never Used  Substance and Sexual Activity  . Alcohol use: Yes    Alcohol/week: 2.0 - 6.0 standard drinks     Types: 2 - 6 Glasses of wine per week    Comment: 2-6 PER WEEK  . Drug use: Not Currently    Types: Marijuana  . Sexual activity: Not Currently  Other Topics Concern  . Not on file  Social History Narrative  . Not on file   Social Determinants of Health   Financial Resource Strain:   . Difficulty of Paying Living Expenses: Not on file  Food Insecurity:   . Worried About Charity fundraiser in the Last Year: Not on file  . Ran Out of Food in the Last Year: Not on file  Transportation Needs:   . Lack of Transportation (Medical): Not on file  . Lack of Transportation (Non-Medical): Not on file  Physical Activity:   . Days of Exercise per Week: Not  on file  . Minutes of Exercise per Session: Not on file  Stress:   . Feeling of Stress : Not on file  Social Connections:   . Frequency of Communication with Friends and Family: Not on file  . Frequency of Social Gatherings with Friends and Family: Not on file  . Attends Religious Services: Not on file  . Active Member of Clubs or Organizations: Not on file  . Attends Archivist Meetings: Not on file  . Marital Status: Not on file  Intimate Partner Violence:   . Fear of Current or Ex-Partner: Not on file  . Emotionally Abused: Not on file  . Physically Abused: Not on file  . Sexually Abused: Not on file     Family History  Problem Relation Age of Onset  . Cancer - Other Mother   . Hypertension Father   . Heart disease Father   . Melanoma Sister   . Alcoholism Son   . Alcohol abuse Son       Review of Systems: All other systems reviewed and are otherwise negative except as noted above.  Physical Exam: Vitals:   09/10/19 2000 09/10/19 2302 09/11/19 0559 09/11/19 0849  BP: 117/87 112/65 97/72 129/85  Pulse: 72 81 75 74  Resp: 18 18 18 16   Temp: 98.6 F (37 C) 98.3 F (36.8 C) 98.3 F (36.8 C) 98.7 F (37.1 C)  TempSrc: Oral Oral Oral Oral  SpO2: 97% 98% 95% 96%  Weight: 71.4 kg     Height: 5\' 8"  (1.727  m)       GEN- The patient is well appearing, alert and oriented x 3 today.   Head- normocephalic, atraumatic Eyes-  Sclera clear, conjunctiva pink Ears- hearing intact Oropharynx- clear Neck- supple Lungs- CTA b/l, normal work of breathing Heart- RRR, no murmurs, rubs or gallops  GI- soft, NT, ND Extremities- no clubbing, cyanosis, or edema MS- no significant deformity or atrophy Skin- no rash or lesion Psych- euthymic mood, full affect   Labs:   Lab Results  Component Value Date   WBC 5.9 09/10/2019   HGB 14.1 09/10/2019   HCT 42.1 09/10/2019   MCV 91.3 09/10/2019   PLT 160 09/10/2019    Recent Labs  Lab 09/07/19 1416 09/07/19 1445 09/10/19 0815  NA 140  --  138  K 4.0  --  3.5  CL 109  --  106  CO2 22   < > 23  BUN 18  --  12  CREATININE 1.03  --  1.01  CALCIUM 8.7*   < > 8.7*  PROT 6.1*  --   --   BILITOT 0.6  --   --   ALKPHOS 39  --   --   ALT 28  --   --   AST 22  --   --   GLUCOSE 132*  --  152*   < > = values in this interval not displayed.   No results found for: CKTOTAL, CKMB, CKMBINDEX, TROPONINI Lab Results  Component Value Date   CHOL 158 09/08/2019   Lab Results  Component Value Date   HDL 57 09/08/2019   Lab Results  Component Value Date   LDLCALC 88 09/08/2019   Lab Results  Component Value Date   TRIG 66 09/08/2019   Lab Results  Component Value Date   CHOLHDL 2.8 09/08/2019   No results found for: LDLDIRECT  No results found for: DDIMER   Radiology/Studies:   CT Code  Stroke CTA Head W/WO contrast Result Date: 09/07/2019 CLINICAL DATA:  69 year old male code stroke presentation. EXAM: CT ANGIOGRAPHY HEAD AND NECK CT PERFUSION BRAIN TECHNIQUE: Multidetector CT imaging of the head and neck was performed using the standard protocol during bolus administration of intravenous contrast. Multiplanar CT image reconstructions and MIPs were obtained to evaluate the vascular anatomy. Carotid stenosis measurements (when applicable) are  obtained utilizing NASCET criteria, using the distal internal carotid diameter as the denominator. Multiphase CT imaging of the brain was performed following IV bolus contrast injection. Subsequent parametric perfusion maps were calculated using RAPID software. CONTRAST:  145mL OMNIPAQUE IOHEXOL 350 MG/ML SOLN COMPARISON:  None. FINDINGS: CT Brain Perfusion Findings: ASPECTS: Plain head CT images today not found in PACS. CBF (<30%) Volume: 24 mL Perfusion (Tmax>6.0s) volume: 21mL, hypoperfusion index 0.6 Mismatch Volume: 26mL Infarction Location:Anterior left MCA territory CTA NECK Skeleton: No acute osseous abnormality identified. Upper chest: Negative. Other neck: No acute findings. Aortic arch: 3 vessel arch configuration. No arch atherosclerosis. Right carotid system: Mild brachiocephalic artery soft plaque without stenosis. Negative right CCA origin. Mild soft and calcified plaque at the medial right ICA bulb without stenosis. Left carotid system: Normal left CCA origin. Negative left carotid bifurcation. Tortuous left ICA just beyond the bulb but no stenosis to the skull base. Vertebral arteries: Mild plaque at the proximal right subclavian artery without stenosis. Normal right vertebral artery origin. Right vertebral artery is patent to the skull base without stenosis. Minimal plaque in the proximal left subclavian artery without stenosis. Normal left vertebral artery origin. The left vertebral is mildly non dominant with a late entry into the cervical transverse foramen but remains patent to the skull base without stenosis. CTA HEAD Posterior circulation: Dominant right vertebral V4 segment. Tortuous left V4. No distal vertebral stenosis. Patent PICA origins and vertebrobasilar junction. Patent basilar artery without stenosis. Normal PCA and SCA origins. Posterior communicating arteries are present, the left is larger. Bilateral PCA branches are within normal limits. Anterior circulation: Both ICA siphons  are patent. No plaque or stenosis on the left. Normal left ophthalmic and posterior communicating artery origins. Minimal right siphon calcified plaque without stenosis. Normal right ophthalmic and posterior communicating artery origins. Patent carotid termini. Normal MCA and ACA origins. Anterior communicating artery and bilateral ACA branches are within normal limits. Right MCA M1 is mildly ectatic, a especially at the right MCA trifurcation (series 6, image 27), but there is no stenosis. Other right MCA branches are within normal limits. Left MCA M1 segment is patent, but just beyond the bifurcation and anterior M2 branch is occluded (series 7, image 52 and series 12, image 28. Posterior left MCA branches seem to remain normal. Venous sinuses: Early contrast timing but grossly patent. Anatomic variants: Dominant right vertebral artery. Review of the MIP images confirms the above findings Study  with Dr. Amie Portland  on 09/07/2019 at 15:02 . IMPRESSION: 1. Positive for emergent large vessel occlusion of the Left MCA anterior M2 branch just beyond the bifurcation. 2. CTP detects 24 mL of infarct core and 71 mL ischemic penumbra in the anterior left MCA territory. 3. The above was discussed by telephone with Dr. Amie Portland on 09/07/2019 at 1502 hours who advises the patient is undergoing endovascular reperfusion. 4. Mild for age underlying atherosclerosis in the head and neck. Ectatic right MCA trifurcation. Electronically Signed   By: Genevie Ann M.D.   On: 09/07/2019 15:11    MR BRAIN WO CONTRAST Result Date: 09/08/2019 CLINICAL DATA:  Stroke follow-up. Status post endovascular revascularization of left M2 occlusion. Presenting symptoms of right hemiparesis and aphasia. EXAM: MRI HEAD WITHOUT CONTRAST TECHNIQUE: Multiplanar, multiecho pulse sequences of the brain and surrounding structures were obtained without intravenous contrast. COMPARISON:  Head CT, CTA, and CTP 09/07/2019 FINDINGS: Brain: There are scattered  small acute left MCA territory infarcts in the left frontal lobe with the largest located in the frontal operculum where there is a small amount of petechial hemorrhage. A few punctate acute cortical infarcts are also noted in the medial aspect of the left superior frontal gyrus posteriorly as well as in the left parietal lobe. There is no intracranial mass effect or extra-axial fluid collection. The ventricles are normal in size. No significant chronic white matter disease is seen for age. Vascular: Major intracranial arterial flow voids are preserved. There is abnormal T2 hyperintensity throughout the nondominant right transverse sinus which did not enhance on CTA suggesting occlusion (versus very slow flow such as from a distal stenosis), however there is no associated regional infarct or hemorrhage. Skull and upper cervical spine: Unremarkable bone marrow signal. Sinuses/Orbits: Unremarkable orbits. Paranasal sinuses and mastoid air cells are clear. Other: None. IMPRESSION: 1. Scattered small acute left cerebral infarcts greatest in the frontal operculum. 2. Abnormal appearance of the right transverse sinus most suggestive of occlusion (versus very slow flow due to a distal stenosis). No associated acute or chronic infarct or hemorrhage. Electronically Signed   By: Logan Bores M.D.   On: 09/08/2019 15:17    IR PERCUTANEOUS ART THROMBECTOMY/INFUSION INTRACRANIAL INC DIAG ANGIO Result Date: 09/11/2019 INDICATION: Acute onset of aphasia and right-sided weakness. Occluded superior division of the left middle cerebral artery M2 segment on CT angiogram of the head and neck. EXAM: 1. EMERGENT LARGE VESSEL OCCLUSION THROMBOLYSIS (anterior CIRCULATION) COMPARISON:  CT angiogram of the head and neck of September 07, 2019. MEDICATIONS: 2 g IV Ancef antibiotic was administered within 1 hour of the procedure. ANESTHESIA/SEDATION: General anesthesia. CONTRAST:  Isovue 300 approximately 60 mL. FLUOROSCOPY TIME:  Fluoroscopy  Time: 25 minutes 36 seconds (1434 mGy). COMPLICATIONS: None immediate. TECHNIQUE: Following a full explanation of the procedure along with the potential associated complications, an informed witnessed consent was obtained from the patient's girlfriend. The risks of intracranial hemorrhage of 10%, worsening neurological deficit, ventilator dependency, death and inability to revascularize were all reviewed in detail with the patient's girlfriend. The patient was then put under general anesthesia by the Department of Anesthesiology at [facility]. The right groin was prepped and draped in the usual sterile fashion. Thereafter using modified Seldinger technique, transfemoral access into the right common femoral artery was obtained without difficulty. Over a 0.035 inch guidewire a 5 French Pinnacle sheath was inserted. Through this, and also over a 0.035 inch guidewire a 5 [place] JB 1 catheter was advanced to the aortic arch region and selectively positioned in the left common carotid artery. FINDINGS: The left common carotid arteriogram demonstrates the left external carotid artery and its major branches to be widely patent. The left internal carotid artery at the bulb is patent. There is a moderate tortuosity of the proximal 1/3 of the left internal carotid artery without evidence of kinking. More distally, the left internal carotid artery is seen to opacify to the cranial skull base. The petrous, cavernous and supraclinoid segments are widely patent. The left middle cerebral artery and the left anterior cerebral artery opacify into the capillary and venous phases. Again demonstrated is the occluded superior division of the  left middle cerebral artery proximal M2 segment. Moderate area of hyper perfusion is seen involving the mid peri-insular and frontal regions. PROCEDURE: The diagnostic JB 1 catheter in the left common carotid artery was exchanged over a 0.035 inch 300 cm Rosen exchange guidewire for an 8  [place] Pinnacle sheath in the right groin which was connected to continuous heparinized saline infusion. Over the exchange guidewire, a [place] 087 balloon guide catheter which had been prepped with 50% contrast and 50% heparinized saline infusion was advanced and positioned in the proximal 1/3 of the left internal carotid artery. The guidewire was removed. Good aspiration obtained from the hub of the balloon guide catheter. A gentle control arteriogram performed through this demonstrated no evidence of spasms, dissections or of intraluminal filling defects. Over a 0.014 inch standard Synchro micro guidewire with the J configuration, the combination of a 6 French 132 cm Catalyst guide catheter inside of which was an [employer] microcatheter was advanced without difficulty to the supraclinoid left ICA using biplane roadmap technique and constant fluoroscopic guidance. Using a torque device, the micro guidewire was then gently advanced without difficulty into the M2 M3 region of the superior division followed by the microcatheter. The guidewire was removed. Slow aspiration of blood was obtained from the hub of the microcatheter. With gentle proximal retrieval, free aspiration obtained. A control arteriogram performed through the microcatheter now demonstrated partial revascularization of the previously occluded inferior division with multiple filling defects in its proximal 1/3 compatible with clot. Again over a 0.014 inch micro guidewire, the microcatheter was advanced into the M3 region of the branch followed by the microcatheter. The guidewire was removed. At this time, good aspiration obtained from the hub of the microcatheter. This was then connected to continuous heparinized saline infusion. A 3 mm x 33 mm Trevo ProVue retrieval device was then advanced to the distal end of the microcatheter. This was then deployed in the usual manner by the microcatheter. With gentle retrieval and suction, the combination  of the retrieval device and the microcatheter were retrieved and removed. Control arteriogram performed through the 6 [place] Catalyst guide catheter now in the proximal M1 segment of the left middle cerebral artery demonstrated near complete revascularization of the superior division. A TICI 3 revascularization was achieved. No evidence of other intraluminal filling defects or of spasm or occlusion was seen. The 6 [place] guide catheter was retrieved and removed. Final control arteriogram performed through the balloon guide catheter in the left common carotid artery demonstrated patency of the internal carotid artery, with a sustained TICI 3 revascularization of the left MCA distribution with wide patency of the left anterior cerebral artery distribution. The balloon guide was retrieved and removed. The 8 French Pinnacle sheath in the right groin was then replaced with an 8 French Angio-Seal closure device with hemostasis. The right groin appeared soft without evidence of hematoma or bleeding. Distal pulses remained palpable in the dorsalis pedis, and the posterior tibial regions bilaterally unchanged. Throughout the procedure, the patient's blood pressure and neurological status remained stable. A flat panel CT of the brain demonstrated no evidence of bleeding or mass effect. The patient was then extubated without difficulty. Upon recovery, the patient already demonstrated improvement of the motor power in the right arm and leg. However, he continued to have difficulty with expression though demonstrated moderate comprehension. He was then returned to the neuro ICU to continue with post thrombectomy management. IMPRESSION: Status post endovascular revascularization of occluded superior division of the left  middle cerebral artery with 1 pass with the 3 mm x 33 mm Trevo ProVue retrieval device and aspiration with a TICI 3 revascularization. PLAN: Follow-up of in the clinic 4 weeks post discharge. Electronically Signed    By: Luanne Bras M.D.   On: 09/08/2019 09:20    VAS [place] LOWER EXTREMITY VENOUS (DVT) Result Date: 09/10/2019  Lower Venous Study Indications: Stroke.  Comparison Study: No prior study on file Performing Technologist: Sharion Dove RVS  Examination Guidelines: A complete evaluation includes B-mode imaging, spectral Doppler, color Doppler, and power Doppler as needed of all accessible portions of each vessel. Bilateral testing is considered an integral part of a complete examination. Limited examinations for reoccurring indications may be performed as noted.  Summary: Right: There is no evidence of deep vein thrombosis in the lower extremity. Left: There is no evidence of deep vein thrombosis in the lower extremity.  *See table(s) above for measurements and observations. Electronically signed by Harold Barban MD on 09/10/2019 at 10:42:13 AM.    Final     12-lead ECG no EKGs in side chart or EPIC All prior EKG's in EPIC reviewed with no documented atrial fibrillation  Telemetry  SR  Assessment and Plan:  1. Cryptogenic stroke The patient presents with cryptogenic stroke.  The patient has a TEE planned for this AM.  I spoke at length with the patient about monitoring for afib with either a 30 day event monitor or an implantable loop recorder.  Risks, benefits, and alteratives to implantable loop recorder were discussed with the patient today.   At this time, the patient is very clear in his decision to proceed with implantable loop recorder.   He has no hx of bleeding or objection to blood thinner medicine if AF is found, ICH is listed in his chart, though he is surprised about that and denies this as known history for him.  He is aphasic currently, we discuss importance of being able to communicate via telephone, he will be staying with his girlfriend at least in the short term and we will be able to communicate with her, and pending on recovery of his speech over time, go from there.  Wound  care was reviewed with the patient (keep incision clean and dry for 3 days).  Wound check will be scheduled for the patient  Please call with questions.   Baldwin Jamaica, PA-C 09/11/2019  I have seen, examined the patient, and reviewed the above assessment and plan.  Changes to above are made where necessary.  On exam, + expressive aphasia.  RRR.    The patient presents with cryptogenic stroke.  The patient has a TEE planned for this AM.  I spoke at length with the patient about monitoring for afib with an implantable loop recorder.  Risks, benefits, and alteratives to implantable loop recorder were discussed with the patient today.   At this time, the patient is very clear in their decision to proceed with implantable loop recorder.   Please call with questions.   Co Sign: Thompson Grayer, MD 09/12/2019 9:58 AM

## 2019-09-11 NOTE — Progress Notes (Signed)
  Speech Language Pathology Treatment:    Patient Details Name: Bryce Logan. MRN: [id number] DOB: 1951/07/07 Today's Date: 09/11/2019 Time: [id number] SLP Time Calculation (min) (ACUTE ONLY): 24 min  Assessment / Plan / Recommendation Clinical Impression  Patient seen at bedside for skilled ST. Patient with no obvious asymmetry. Patient presents with moderate apraxia of speech c/b difficulty with consonant clusters, difficulty with phrase repetition and inconsistent speech errors. ST provided education re: strategies to target apraxia of speech including the use of pacing and melodic intonation. Patient demonstrated the ability to use both strategies with success and improved speech.  In today's session, functional tasks including ordering meals from the hospital cafeteria were targeted. Patient's writing skills are preserved. Patient shared that he is communicating with staff primarily with a mix of writing (pen/paper at bedside) and gestures. Patient will benefit from targeted ST while in and also once discharged to the next level of care.   HPI        SLP Plan  Continue with current plan of care       Recommendations Outpatient ST                  SLP Visit Diagnosis: Apraxia (R48.2) Plan: Continue with current plan of care       Stewardson, M.Ed., CCC-SLP Speech Therapy [facility] [place] 09/11/2019, 1:22 PM

## 2019-09-11 NOTE — Discharge Summary (Addendum)
Stroke Discharge Summary  Patient ID: Bryce Logan   MRN: [id number]      DOB: [birth year]  Date of Admission: 09/07/2019 Date of Discharge: 09/12/2019  Attending Physician:  Rosalin Hawking, MD, Stroke MD Consultant(s):   Olene Craven) Estanislado Pandy, MD (Interventional Neuroradiologist), Lyman Bishop, MD (cardiology for TEE),  Thompson Grayer, MD (electrophysiology-loop)   Patient's PCP:  Kathyrn Lass, MD  DISCHARGE DIAGNOSIS:  Principal Problem:   Cryptogenic stroke ([place]) L MCA s/p tPA and IR Active Problems:   CAD (coronary artery disease)   Thoracic aortic aneurysm ([place])   Hyperlipidemia   Middle cerebral artery embolism, left   Expressive aphasia - Aphemia   Possible Dilatation of aortic sinus of Valsalva  Allergies as of 09/12/2019      Reactions   Sulfa Antibiotics    SEVERE FATIGUE AND SIDE EFFECTS      Medication List    TAKE these medications   ALPRAZolam 0.5 MG tablet Commonly known as: XANAX Take 1 mg by mouth at bedtime.   amphetamine-dextroamphetamine 10 MG tablet Commonly known as: ADDERALL Take 10 mg by mouth See admin instructions. Takes 2.5mg  -5mg  daily as needed per patient   aspirin 81 MG EC tablet Take 1 tablet (81 mg total) by mouth daily. Start taking on: September 13, 2019   Astaxanthin 4 MG Caps Take 1 capsule by mouth daily.   cetirizine 10 MG tablet Commonly known as: ZYRTEC Take 10 mg by mouth daily as needed for allergies.   clopidogrel 75 MG tablet Commonly known as: PLAVIX Take 1 tablet (75 mg total) by mouth daily for 21 days. Start taking on: September 13, 2019   Co Q 10 100 MG Caps Take 1 capsule by mouth daily.   KRILL OIL PO Take 1 tablet by mouth daily.   Magnesium 300 MG Caps Take 300 mg by mouth daily.   NON FORMULARY LITHIUM OROTATE   NON FORMULARY Take 1 tablet by mouth daily. Sam-e 400mg  daily   pravastatin 20 MG tablet Commonly known as: PRAVACHOL Take 1 tablet (20 mg total) by mouth daily at 6 PM.   vitamin C  1000 MG tablet Take 1,000 mg by mouth daily.   zolpidem 10 MG tablet Commonly known as: AMBIEN Take 5 mg by mouth at bedtime as needed for sleep. Reports breaking tablet into 1/3 tablet       LABORATORY STUDIES CBC    Component Value Date/Time   WBC 5.9 09/10/2019 0815   RBC 4.61 09/10/2019 0815   HGB 14.1 09/10/2019 0815   HCT 42.1 09/10/2019 0815   PLT 160 09/10/2019 0815   MCV 91.3 09/10/2019 0815   MCH 30.6 09/10/2019 0815   MCHC 33.5 09/10/2019 0815   RDW 12.6 09/10/2019 0815   LYMPHSABS 1.1 09/08/2019 0418   MONOABS 0.7 09/08/2019 0418   EOSABS 0.1 09/08/2019 0418   BASOSABS 0.0 09/08/2019 0418   CMP    Component Value Date/Time   NA 138 09/10/2019 0815   K 3.5 09/10/2019 0815   CL 106 09/10/2019 0815   CO2 23 09/10/2019 0815   GLUCOSE 152 (H) 09/10/2019 0815   BUN 12 09/10/2019 0815   CREATININE 1.01 09/10/2019 0815   CALCIUM 8.7 (L) 09/10/2019 0815   PROT 6.1 (L) 09/07/2019 1416   ALBUMIN 3.8 09/07/2019 1416   AST 22 09/07/2019 1416   ALT 28 09/07/2019 1416   ALKPHOS 39 09/07/2019 1416   BILITOT 0.6 09/07/2019 1416   GFRNONAA >60  09/10/2019 0815   GFRAA >60 09/10/2019 0815   COAGS Lab Results  Component Value Date   INR 1.0 09/07/2019   Lipid Panel    Component Value Date/Time   CHOL 158 09/08/2019 0418   TRIG 66 09/08/2019 0418   HDL 57 09/08/2019 0418   CHOLHDL 2.8 09/08/2019 0418   VLDL 13 09/08/2019 0418   LDLCALC 88 09/08/2019 0418   HgbA1C  Lab Results  Component Value Date   HGBA1C 5.4 09/08/2019   Urine Drug Screen     Component Value Date/Time   LABOPIA NONE DETECTED 09/09/2019 2311   COCAINSCRNUR NONE DETECTED 09/09/2019 2311   LABBENZ NONE DETECTED 09/09/2019 2311   AMPHETMU NONE DETECTED 09/09/2019 2311   THCU NONE DETECTED 09/09/2019 2311   LABBARB NONE DETECTED 09/09/2019 2311     SIGNIFICANT DIAGNOSTIC STUDIES CT Code Stroke CTA Head W/WO contrast  Result Date: 09/07/2019 CLINICAL DATA:  69 year old male code  stroke presentation. EXAM: CT ANGIOGRAPHY HEAD AND NECK CT PERFUSION BRAIN TECHNIQUE: Multidetector CT imaging of the head and neck was performed using the standard protocol during bolus administration of intravenous contrast. Multiplanar CT image reconstructions and MIPs were obtained to evaluate the vascular anatomy. Carotid stenosis measurements (when applicable) are obtained utilizing NASCET criteria, using the distal internal carotid diameter as the denominator. Multiphase CT imaging of the brain was performed following IV bolus contrast injection. Subsequent parametric perfusion maps were calculated using RAPID software. CONTRAST:  174mL OMNIPAQUE IOHEXOL 350 MG/ML SOLN COMPARISON:  None. FINDINGS: CT Brain Perfusion Findings: ASPECTS: Plain head CT images today not found in PACS. CBF (<30%) Volume: 24 mL Perfusion (Tmax>6.0s) volume: 25mL, hypoperfusion index 0.6 Mismatch Volume: 52mL Infarction Location:Anterior left MCA territory CTA NECK Skeleton: No acute osseous abnormality identified. Upper chest: Negative. Other neck: No acute findings. Aortic arch: 3 vessel arch configuration. No arch atherosclerosis. Right carotid system: Mild brachiocephalic artery soft plaque without stenosis. Negative right CCA origin. Mild soft and calcified plaque at the medial right ICA bulb without stenosis. Left carotid system: Normal left CCA origin. Negative left carotid bifurcation. Tortuous left ICA just beyond the bulb but no stenosis to the skull base. Vertebral arteries: Mild plaque at the proximal right subclavian artery without stenosis. Normal right vertebral artery origin. Right vertebral artery is patent to the skull base without stenosis. Minimal plaque in the proximal left subclavian artery without stenosis. Normal left vertebral artery origin. The left vertebral is mildly non dominant with a late entry into the cervical transverse foramen but remains patent to the skull base without stenosis. CTA HEAD Posterior  circulation: Dominant right vertebral V4 segment. Tortuous left V4. No distal vertebral stenosis. Patent PICA origins and vertebrobasilar junction. Patent basilar artery without stenosis. Normal PCA and SCA origins. Posterior communicating arteries are present, the left is larger. Bilateral PCA branches are within normal limits. Anterior circulation: Both ICA siphons are patent. No plaque or stenosis on the left. Normal left ophthalmic and posterior communicating artery origins. Minimal right siphon calcified plaque without stenosis. Normal right ophthalmic and posterior communicating artery origins. Patent carotid termini. Normal MCA and ACA origins. Anterior communicating artery and bilateral ACA branches are within normal limits. Right MCA M1 is mildly ectatic, a especially at the right MCA trifurcation (series 6, image 27), but there is no stenosis. Other right MCA branches are within normal limits. Left MCA M1 segment is patent, but just beyond the bifurcation and anterior M2 branch is occluded (series 7, image 52 and series 12, image 28.  Posterior left MCA branches seem to remain normal. Venous sinuses: Early contrast timing but grossly patent. Anatomic variants: Dominant right vertebral artery. Review of the MIP images confirms the above findings Study  with Dr. Amie Portland  on 09/07/2019 at 15:02 . IMPRESSION: 1. Positive for emergent large vessel occlusion of the Left MCA anterior M2 branch just beyond the bifurcation. 2. CTP detects 24 mL of infarct core and 71 mL ischemic penumbra in the anterior left MCA territory. 3. The above was discussed by telephone with Dr. Amie Portland on 09/07/2019 at 1502 hours who advises the patient is undergoing endovascular reperfusion. 4. Mild for age underlying atherosclerosis in the head and neck. Ectatic right MCA trifurcation. Electronically Signed   By: Genevie Ann M.D.   On: 09/07/2019 15:11   CT Code Stroke CTA Neck W/WO contrast  Result Date: 09/07/2019 CLINICAL DATA:   69 year old male code stroke presentation. EXAM: CT ANGIOGRAPHY HEAD AND NECK CT PERFUSION BRAIN TECHNIQUE: Multidetector CT imaging of the head and neck was performed using the standard protocol during bolus administration of intravenous contrast. Multiplanar CT image reconstructions and MIPs were obtained to evaluate the vascular anatomy. Carotid stenosis measurements (when applicable) are obtained utilizing NASCET criteria, using the distal internal carotid diameter as the denominator. Multiphase CT imaging of the brain was performed following IV bolus contrast injection. Subsequent parametric perfusion maps were calculated using RAPID software. CONTRAST:  159mL OMNIPAQUE IOHEXOL 350 MG/ML SOLN COMPARISON:  None. FINDINGS: CT Brain Perfusion Findings: ASPECTS: Plain head CT images today not found in PACS. CBF (<30%) Volume: 24 mL Perfusion (Tmax>6.0s) volume: 45mL, hypoperfusion index 0.6 Mismatch Volume: 68mL Infarction Location:Anterior left MCA territory CTA NECK Skeleton: No acute osseous abnormality identified. Upper chest: Negative. Other neck: No acute findings. Aortic arch: 3 vessel arch configuration. No arch atherosclerosis. Right carotid system: Mild brachiocephalic artery soft plaque without stenosis. Negative right CCA origin. Mild soft and calcified plaque at the medial right ICA bulb without stenosis. Left carotid system: Normal left CCA origin. Negative left carotid bifurcation. Tortuous left ICA just beyond the bulb but no stenosis to the skull base. Vertebral arteries: Mild plaque at the proximal right subclavian artery without stenosis. Normal right vertebral artery origin. Right vertebral artery is patent to the skull base without stenosis. Minimal plaque in the proximal left subclavian artery without stenosis. Normal left vertebral artery origin. The left vertebral is mildly non dominant with a late entry into the cervical transverse foramen but remains patent to the skull base without  stenosis. CTA HEAD Posterior circulation: Dominant right vertebral V4 segment. Tortuous left V4. No distal vertebral stenosis. Patent PICA origins and vertebrobasilar junction. Patent basilar artery without stenosis. Normal PCA and SCA origins. Posterior communicating arteries are present, the left is larger. Bilateral PCA branches are within normal limits. Anterior circulation: Both ICA siphons are patent. No plaque or stenosis on the left. Normal left ophthalmic and posterior communicating artery origins. Minimal right siphon calcified plaque without stenosis. Normal right ophthalmic and posterior communicating artery origins. Patent carotid termini. Normal MCA and ACA origins. Anterior communicating artery and bilateral ACA branches are within normal limits. Right MCA M1 is mildly ectatic, a especially at the right MCA trifurcation (series 6, image 27), but there is no stenosis. Other right MCA branches are within normal limits. Left MCA M1 segment is patent, but just beyond the bifurcation and anterior M2 branch is occluded (series 7, image 52 and series 12, image 28. Posterior left MCA branches seem to remain normal.  Venous sinuses: Early contrast timing but grossly patent. Anatomic variants: Dominant right vertebral artery. Review of the MIP images confirms the above findings Study  with Dr. Amie Portland  on 09/07/2019 at 15:02 . IMPRESSION: 1. Positive for emergent large vessel occlusion of the Left MCA anterior M2 branch just beyond the bifurcation. 2. CTP detects 24 mL of infarct core and 71 mL ischemic penumbra in the anterior left MCA territory. 3. The above was discussed by telephone with Dr. Amie Portland on 09/07/2019 at 1502 hours who advises the patient is undergoing endovascular reperfusion. 4. Mild for age underlying atherosclerosis in the head and neck. Ectatic right MCA trifurcation. Electronically Signed   By: Genevie Ann M.D.   On: 09/07/2019 15:11   MR BRAIN WO CONTRAST  Result Date:  09/08/2019 CLINICAL DATA:  Stroke follow-up. Status post endovascular revascularization of left M2 occlusion. Presenting symptoms of right hemiparesis and aphasia. EXAM: MRI HEAD WITHOUT CONTRAST TECHNIQUE: Multiplanar, multiecho pulse sequences of the brain and surrounding structures were obtained without intravenous contrast. COMPARISON:  Head CT, CTA, and CTP 09/07/2019 FINDINGS: Brain: There are scattered small acute left MCA territory infarcts in the left frontal lobe with the largest located in the frontal operculum where there is a small amount of petechial hemorrhage. A few punctate acute cortical infarcts are also noted in the medial aspect of the left superior frontal gyrus posteriorly as well as in the left parietal lobe. There is no intracranial mass effect or extra-axial fluid collection. The ventricles are normal in size. No significant chronic white matter disease is seen for age. Vascular: Major intracranial arterial flow voids are preserved. There is abnormal T2 hyperintensity throughout the nondominant right transverse sinus which did not enhance on CTA suggesting occlusion (versus very slow flow such as from a distal stenosis), however there is no associated regional infarct or hemorrhage. Skull and upper cervical spine: Unremarkable bone marrow signal. Sinuses/Orbits: Unremarkable orbits. Paranasal sinuses and mastoid air cells are clear. Other: None. IMPRESSION: 1. Scattered small acute left cerebral infarcts greatest in the frontal operculum. 2. Abnormal appearance of the right transverse sinus most suggestive of occlusion (versus very slow flow due to a distal stenosis). No associated acute or chronic infarct or hemorrhage. Electronically Signed   By: Logan Bores M.D.   On: 09/08/2019 15:17   EP PPM/ICD IMPLANT  Result Date: 09/12/2019 SURGEON:  Thompson Grayer, MD   PREPROCEDURE DIAGNOSIS:  Cryptogenic Stroke   POSTPROCEDURE DIAGNOSIS:  Cryptogenic Stroke    PROCEDURES:  1. Implantable loop  recorder implantation   INTRODUCTION:  Bryce Logan. is a 69 y.o. male with a history of unexplained stroke who presents today for implantable loop implantation.  The patient has had a cryptogenic stroke.  Despite an extensive workup by neurology, no reversible causes have been identified.  he has worn telemetry during which he did not have arrhythmias.  There is significant concern for possible atrial fibrillation as the cause for the patients stroke.  The patient therefore presents today for implantable loop implantation.   DESCRIPTION OF PROCEDURE:  Informed written consent was obtained.  The patient required no sedation for the procedure today.  The patients left chest was prepped and draped. Mapping over the patient's chest was performed to identify the appropriate ILR site.  This area was found to be the left  parasternal region over the 3rd-4th intercostal space.  The skin overlying this region was infiltrated with lidocaine for local analgesia.  A 0.5-cm incision was made  at the implant site.  A subcutaneous ILR pocket was fashioned using a combination of sharp and blunt dissection.  A Medtronic Reveal Linq model [id number] implantable loop recorder was then placed into the pocket R waves were very prominent and measured > 0.2 mV. EBL<1 ml.  Steri- Strips and a sterile dressing were then applied.  There were no early apparent complications.   CONCLUSIONS:  1. Successful implantation of a Medtronic Reveal LINQ implantable loop recorder for cryptogenic stroke  2. No early apparent complications. Thompson Grayer MD, [facility] 09/12/2019 11:46 AM   IR CT Head Ltd  Result Date: 09/07/2019 INDICATION: Acute onset of aphasia and right-sided weakness. Occluded superior division of the left middle cerebral artery M2 segment on CT angiogram of the head and neck.  EXAM: 1. EMERGENT LARGE VESSEL OCCLUSION THROMBOLYSIS (anterior CIRCULATION)  COMPARISON:  CT angiogram of the head and neck of September 07, 2019.  MEDICATIONS: 2 g  IV Ancef antibiotic was administered within 1 hour of the procedure.  ANESTHESIA/SEDATION: General anesthesia.  CONTRAST:  Isovue 300 approximately 60 mL.  FLUOROSCOPY TIME:  Fluoroscopy Time: 25 minutes 36 seconds (1434 mGy).  COMPLICATIONS: None immediate.  TECHNIQUE: Following a full explanation of the procedure along with the potential associated complications, an informed witnessed consent was obtained from the patient's girlfriend. The risks of intracranial hemorrhage of 10%, worsening neurological deficit, ventilator dependency, death and inability to revascularize were all reviewed in detail with the patient's girlfriend.  The patient was then put under general anesthesia by the Department of Anesthesiology at [facility].  The right groin was prepped and draped in the usual sterile fashion. Thereafter using modified Seldinger technique, transfemoral access into the right common femoral artery was obtained without difficulty. Over a 0.035 inch guidewire a 5 French Pinnacle sheath was inserted. Through this, and also over a 0.035 inch guidewire a 5 [place] JB 1 catheter was advanced to the aortic arch region and selectively positioned in the left common carotid artery.  FINDINGS: The left common carotid arteriogram demonstrates the left external carotid artery and its major branches to be widely patent.  The left internal carotid artery at the bulb is patent. There is a moderate tortuosity of the proximal 1/3 of the left internal carotid artery without evidence of kinking. More distally, the left internal carotid artery is seen to opacify to the cranial skull base.  The petrous, cavernous and supraclinoid segments are widely patent.  The left middle cerebral artery and the left anterior cerebral artery opacify into the capillary and venous phases.  Again demonstrated is the occluded superior division of the left middle cerebral artery proximal M2 segment. Moderate area of hyper perfusion is  seen involving the mid peri-insular and frontal regions.  PROCEDURE: The diagnostic JB 1 catheter in the left common carotid artery was exchanged over a 0.035 inch 300 cm Rosen exchange guidewire for an 8 [place] Pinnacle sheath in the right groin which was connected to continuous heparinized saline infusion. Over the exchange guidewire, a [place] 087 balloon guide catheter which had been prepped with 50% contrast and 50% heparinized saline infusion was advanced and positioned in the proximal 1/3 of the left internal carotid artery. The guidewire was removed. Good aspiration obtained from the hub of the balloon guide catheter. A gentle control arteriogram performed through this demonstrated no evidence of spasms, dissections or of intraluminal filling defects. Over a 0.014 inch standard Synchro micro guidewire with the J configuration, the combination of a 6 [place] 132  cm Catalyst guide catheter inside of which was an 021 Trevo ProVue microcatheter was advanced without difficulty to the supraclinoid left ICA using biplane roadmap technique and constant fluoroscopic guidance. Using a torque device, the micro guidewire was then gently advanced without difficulty into the M2 M3 region of the superior division followed by the microcatheter. The guidewire was removed. Slow aspiration of blood was obtained from the hub of the microcatheter. With gentle proximal retrieval, free aspiration obtained.  A control arteriogram performed through the microcatheter now demonstrated partial revascularization of the previously occluded inferior division with multiple filling defects in its proximal 1/3 compatible with clot.  Again over a 0.014 inch micro guidewire, the microcatheter was advanced into the M3 region of the branch followed by the microcatheter. The guidewire was removed. At this time, good aspiration obtained from the hub of the microcatheter. This was then connected to continuous heparinized saline infusion. A 3 mm x  33 mm Trevo ProVue retrieval device was then advanced to the distal end of the microcatheter. This was then deployed in the usual manner by the microcatheter. With gentle retrieval and suction, the combination of the retrieval device and the microcatheter were retrieved and removed. Control arteriogram performed through the 6 [place] Catalyst guide catheter now in the proximal M1 segment of the left middle cerebral artery demonstrated near complete revascularization of the superior division. A TICI 3 revascularization was achieved.  No evidence of other intraluminal filling defects or of spasm or occlusion was seen.  The 6 [place] guide catheter was retrieved and removed. Final control arteriogram performed through the balloon guide catheter in the left common carotid artery demonstrated patency of the internal carotid artery, with a sustained TICI 3 revascularization of the left MCA distribution with wide patency of the left anterior cerebral artery distribution.  The balloon guide was retrieved and removed. The 8 French Pinnacle sheath in the right groin was then replaced with an 8 French Angio-Seal closure device with hemostasis. The right groin appeared soft without evidence of hematoma or bleeding. Distal pulses remained palpable in the dorsalis pedis, and the posterior tibial regions bilaterally unchanged.  Throughout the procedure, the patient's blood pressure and neurological status remained stable. A flat panel CT of the brain demonstrated no evidence of bleeding or mass effect.  The patient was then extubated without difficulty. Upon recovery, the patient already demonstrated improvement of the motor power in the right arm and leg.  However, he continued to have difficulty with expression though demonstrated moderate comprehension.  He was then returned to the neuro ICU to continue with post thrombectomy management.  IMPRESSION: Status post endovascular revascularization of occluded superior division  of the left middle cerebral artery with 1 pass with the 3 mm x 33 mm Trevo ProVue retrieval device and aspiration with a TICI 3 revascularization.  PLAN: Follow-up of in the clinic 4 weeks post discharge.   Electronically Signed   By: Luanne Bras M.D.   On: 09/08/2019 09:20   CT Code Stroke Cerebral Perfusion with contrast  Result Date: 09/07/2019 CLINICAL DATA:  69 year old male code stroke presentation. EXAM: CT ANGIOGRAPHY HEAD AND NECK CT PERFUSION BRAIN TECHNIQUE: Multidetector CT imaging of the head and neck was performed using the standard protocol during bolus administration of intravenous contrast. Multiplanar CT image reconstructions and MIPs were obtained to evaluate the vascular anatomy. Carotid stenosis measurements (when applicable) are obtained utilizing NASCET criteria, using the distal internal carotid diameter as the denominator. Multiphase CT imaging of the brain  was performed following IV bolus contrast injection. Subsequent parametric perfusion maps were calculated using RAPID software. CONTRAST:  163mL OMNIPAQUE IOHEXOL 350 MG/ML SOLN COMPARISON:  None. FINDINGS: CT Brain Perfusion Findings: ASPECTS: Plain head CT images today not found in PACS. CBF (<30%) Volume: 24 mL Perfusion (Tmax>6.0s) volume: 23mL, hypoperfusion index 0.6 Mismatch Volume: 58mL Infarction Location:Anterior left MCA territory CTA NECK Skeleton: No acute osseous abnormality identified. Upper chest: Negative. Other neck: No acute findings. Aortic arch: 3 vessel arch configuration. No arch atherosclerosis. Right carotid system: Mild brachiocephalic artery soft plaque without stenosis. Negative right CCA origin. Mild soft and calcified plaque at the medial right ICA bulb without stenosis. Left carotid system: Normal left CCA origin. Negative left carotid bifurcation. Tortuous left ICA just beyond the bulb but no stenosis to the skull base. Vertebral arteries: Mild plaque at the proximal right subclavian artery  without stenosis. Normal right vertebral artery origin. Right vertebral artery is patent to the skull base without stenosis. Minimal plaque in the proximal left subclavian artery without stenosis. Normal left vertebral artery origin. The left vertebral is mildly non dominant with a late entry into the cervical transverse foramen but remains patent to the skull base without stenosis. CTA HEAD Posterior circulation: Dominant right vertebral V4 segment. Tortuous left V4. No distal vertebral stenosis. Patent PICA origins and vertebrobasilar junction. Patent basilar artery without stenosis. Normal PCA and SCA origins. Posterior communicating arteries are present, the left is larger. Bilateral PCA branches are within normal limits. Anterior circulation: Both ICA siphons are patent. No plaque or stenosis on the left. Normal left ophthalmic and posterior communicating artery origins. Minimal right siphon calcified plaque without stenosis. Normal right ophthalmic and posterior communicating artery origins. Patent carotid termini. Normal MCA and ACA origins. Anterior communicating artery and bilateral ACA branches are within normal limits. Right MCA M1 is mildly ectatic, a especially at the right MCA trifurcation (series 6, image 27), but there is no stenosis. Other right MCA branches are within normal limits. Left MCA M1 segment is patent, but just beyond the bifurcation and anterior M2 branch is occluded (series 7, image 52 and series 12, image 28. Posterior left MCA branches seem to remain normal. Venous sinuses: Early contrast timing but grossly patent. Anatomic variants: Dominant right vertebral artery. Review of the MIP images confirms the above findings Study  with Dr. Amie Portland  on 09/07/2019 at 15:02 . IMPRESSION: 1. Positive for emergent large vessel occlusion of the Left MCA anterior M2 branch just beyond the bifurcation. 2. CTP detects 24 mL of infarct core and 71 mL ischemic penumbra in the anterior left MCA  territory. 3. The above was discussed by telephone with Dr. Amie Portland on 09/07/2019 at 1502 hours who advises the patient is undergoing endovascular reperfusion. 4. Mild for age underlying atherosclerosis in the head and neck. Ectatic right MCA trifurcation. Electronically Signed   By: Genevie Ann M.D.   On: 09/07/2019 15:11   ECHO TEE  Result Date: 09/12/2019   TRANSESOPHOGEAL ECHO REPORT   Patient Name:   Bryce Logan. Date of Exam: 09/12/2019 Medical Rec #:  [id number]       Height:       68.0 in Accession #:    [id number]      Weight:       157.4 lb Date of Birth:  08/01/1951        BSA:          1.85 m Patient Age:    56  years        BP:           113/85 mmHg Patient Gender: M               HR:           82 bpm. Exam Location:  Inpatient  Procedure: Transesophageal Echo Indications:    Stroke 434.91 / I63.9  History:        Patient has prior history of Echocardiogram examinations, most                 recent 09/09/2019.  Sonographer:    Vikki Ports Turrentine Referring Phys: [place]: No source of intracardiac mass. Patients was monitored while under deep sedation. The transesophogeal probe was passed through the esophogus of the patient. Imaged were obtained with the patient in a supine position. Image quality was good. The patient's vital signs; including heart rate, blood pressure, and oxygen saturation; remained stable throughout the procedure. The patient developed no complications during the procedure. IMPRESSIONS  1. Left ventricular ejection fraction, by visual estimation, is 55 to 60%. The left ventricle has normal function. There is no left ventricular hypertrophy.  2. The left ventricle has no regional wall motion abnormalities.  3. Global right ventricle has normal systolic function.The right ventricular size is normal. No increase in right ventricular wall thickness.  4. Left atrial size was normal.  5. Right atrial size was normal.  6. The mitral valve is grossly normal. Trivial  mitral valve regurgitation.  7. The tricuspid valve is grossly normal.  8. The aortic valve is tricuspid. Aortic valve regurgitation is not visualized.  9. The pulmonic valve was grossly normal. Pulmonic valve regurgitation is not visualized. 10. 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 11. Aortic dilatation noted. 12. No intracardiac thrombi or masses were visualized. FINDINGS  Left Ventricle: Left ventricular ejection fraction, by visual estimation, is 55 to 60%. The left ventricle has normal function. The left ventricle has no regional wall motion abnormalities. There is no left ventricular hypertrophy. Right Ventricle: The right ventricular size is normal. No increase in right ventricular wall thickness. Global RV systolic function is has normal systolic function. Left Atrium: Left atrial size was normal in size. Right Atrium: Right atrial size was normal in size Pericardium: There is no evidence of pericardial effusion. Mitral Valve: The mitral valve is grossly normal. Trivial mitral valve regurgitation. Tricuspid Valve: The tricuspid valve is grossly normal. Tricuspid valve regurgitation is trivial. Aortic Valve: The aortic valve is tricuspid. Aortic valve regurgitation is not visualized. Pulmonic Valve: The pulmonic valve was grossly normal. Pulmonic valve regurgitation is not visualized. Aorta: Aortic dilatation noted. There is an aneurysm involving the aortic sinuses of Valsalva. The aneurysm measures 43 mm. Shunts: No atrial level shunt detected by color flow Doppler. Additional Comments: No intracardiac thrombi or masses were visualized.   AORTA                Normals Ao Asc diam: 3.40 cm 31 mm  Lyman Bishop MD Electronically signed by Lyman Bishop MD Signature Date/Time: 09/12/2019/12:28:25 PM    Final    ECHOCARDIOGRAM COMPLETE BUBBLE STUDY  Result Date: 09/08/2019   ECHOCARDIOGRAM REPORT   Patient Name:   Bryce Logan. Date of Exam: 09/08/2019 Medical Rec #:  [id number]        Height:       68.0 in Accession #:    [id number]  Weight:       164.9 lb Date of Birth:  1951/05/12        BSA:          1.88 m Patient Age:    79 years        BP:           119/76 mmHg Patient Gender: M               HR:           77 bpm. Exam Location:  Inpatient Procedure: 2D Echo, Color Doppler, Cardiac Doppler and Saline Contrast Bubble            Study Indications:    Stroke  History:        Patient has prior history of Echocardiogram examinations, most                 recent 09/28/2013. CAD; Risk Factors:Dyslipidemia.  Sonographer:    Raquel Sarna Senior RDCS Referring Phys: [place]  1. Left ventricular ejection fraction, by visual estimation, is 60 to 65%. The left ventricle has normal function. There is no left ventricular hypertrophy. Normal diastolic function  2. Global right ventricle has normal systolic function.The right ventricular size is normal. No increase in right ventricular wall thickness.  3. Left atrial size was normal.  4. Right atrial size was normal.  5. The mitral valve is normal in structure. Trivial mitral valve regurgitation.  6. The tricuspid valve is normal in structure.  7. The aortic valve is tricuspid. Aortic valve regurgitation is not visualized. No evidence of aortic valve stenosis.  8. The pulmonic valve was not well visualized. Pulmonic valve regurgitation is not visualized.  9. TR signal is inadequate for assessing pulmonary artery systolic pressure. 10. The inferior vena cava is dilated in size with >50% respiratory variability, suggesting right atrial pressure of 8 mmHg. 11. There is mild dilatation of the aortic root measuring 40 mm. 12. Saline contrast bubble study was negative, with no evidence of any interatrial shunt. FINDINGS  Left Ventricle: Left ventricular ejection fraction, by visual estimation, is 60 to 65%. The left ventricle has normal function. The left ventricle has no regional wall motion abnormalities. There is no left ventricular  hypertrophy. Left ventricular diastolic parameters were normal. Right Ventricle: The right ventricular size is normal. No increase in right ventricular wall thickness. Global RV systolic function is has normal systolic function. Left Atrium: Left atrial size was normal in size. Right Atrium: Right atrial size was normal in size Pericardium: Trivial pericardial effusion is present. Mitral Valve: The mitral valve is normal in structure. Trivial mitral valve regurgitation. Tricuspid Valve: The tricuspid valve is normal in structure. Tricuspid valve regurgitation is not demonstrated. Aortic Valve: The aortic valve is tricuspid. Aortic valve regurgitation is not visualized. The aortic valve is structurally normal, with no evidence of sclerosis or stenosis. Pulmonic Valve: The pulmonic valve was not well visualized. Pulmonic valve regurgitation is not visualized. Pulmonic regurgitation is not visualized. Aorta: Aortic dilatation noted. There is mild dilatation of the aortic root measuring 40 mm. Venous: The inferior vena cava is dilated in size with greater than 50% respiratory variability, suggesting right atrial pressure of 8 mmHg. IAS/Shunts: No atrial level shunt detected by color flow Doppler. Agitated saline contrast was given intravenously to evaluate for intracardiac shunting. Valsalva maneuver was used. Saline contrast bubble study was negative, with no evidence of any interatrial shunt.  LEFT VENTRICLE PLAX 2D LVIDd:  4.70 cm  Diastology LVIDs:         3.20 cm  LV e' lateral:   10.10 cm/s LV PW:         0.80 cm  LV E/e' lateral: 7.5 LV IVS:        0.80 cm  LV e' medial:    8.59 cm/s LVOT diam:     2.30 cm  LV E/e' medial:  8.8 LV SV:         61 ml LV SV Index:   32.25 LVOT Area:     4.15 cm  RIGHT VENTRICLE RV S prime:     13.40 cm/s TAPSE (M-mode): 2.5 cm LEFT ATRIUM             Index       RIGHT ATRIUM           Index LA diam:        2.70 cm 1.43 cm/m  RA Area:     14.90 cm LA Vol (A2C):   40.9 ml  21.70 ml/m RA Volume:   34.20 ml  18.16 ml/m LA Vol (A4C):   36.2 ml 19.23 ml/m LA Biplane Vol: 41.6 ml 22.09 ml/m  AORTIC VALVE LVOT Vmax:   95.80 cm/s LVOT Vmean:  63.800 cm/s LVOT VTI:    0.193 m  AORTA Ao Root diam: 4.00 cm Ao Asc diam:  3.50 cm MITRAL VALVE MV Area (PHT): 2.99 cm              SHUNTS MV PHT:        73.66 msec            Systemic VTI:  0.19 m MV Decel Time: 254 msec              Systemic Diam: 2.30 cm MV E velocity: 75.80 cm/s  103 cm/s MV A velocity: 105.00 cm/s 70.3 cm/s MV E/A ratio:  0.72        1.5  Oswaldo Milian MD Electronically signed by Oswaldo Milian MD Signature Date/Time: 09/08/2019/2:01:17 PM    Final    IR PERCUTANEOUS ART THROMBECTOMY/INFUSION INTRACRANIAL INC DIAG ANGIO  Result Date: 09/11/2019 INDICATION: Acute onset of aphasia and right-sided weakness. Occluded superior division of the left middle cerebral artery M2 segment on CT angiogram of the head and neck. EXAM: 1. EMERGENT LARGE VESSEL OCCLUSION THROMBOLYSIS (anterior CIRCULATION) COMPARISON:  CT angiogram of the head and neck of September 07, 2019. MEDICATIONS: 2 g IV Ancef antibiotic was administered within 1 hour of the procedure. ANESTHESIA/SEDATION: General anesthesia. CONTRAST:  Isovue 300 approximately 60 mL. FLUOROSCOPY TIME:  Fluoroscopy Time: 25 minutes 36 seconds (1434 mGy). COMPLICATIONS: None immediate. TECHNIQUE: Following a full explanation of the procedure along with the potential associated complications, an informed witnessed consent was obtained from the patient's girlfriend. The risks of intracranial hemorrhage of 10%, worsening neurological deficit, ventilator dependency, death and inability to revascularize were all reviewed in detail with the patient's girlfriend. The patient was then put under general anesthesia by the Department of Anesthesiology at [facility]. The right groin was prepped and draped in the usual sterile fashion. Thereafter using modified Seldinger  technique, transfemoral access into the right common femoral artery was obtained without difficulty. Over a 0.035 inch guidewire a 5 French Pinnacle sheath was inserted. Through this, and also over a 0.035 inch guidewire a 5 [place] JB 1 catheter was advanced to the aortic arch region and selectively positioned in the left common carotid artery. FINDINGS:  The left common carotid arteriogram demonstrates the left external carotid artery and its major branches to be widely patent. The left internal carotid artery at the bulb is patent. There is a moderate tortuosity of the proximal 1/3 of the left internal carotid artery without evidence of kinking. More distally, the left internal carotid artery is seen to opacify to the cranial skull base. The petrous, cavernous and supraclinoid segments are widely patent. The left middle cerebral artery and the left anterior cerebral artery opacify into the capillary and venous phases. Again demonstrated is the occluded superior division of the left middle cerebral artery proximal M2 segment. Moderate area of hyper perfusion is seen involving the mid peri-insular and frontal regions. PROCEDURE: The diagnostic JB 1 catheter in the left common carotid artery was exchanged over a 0.035 inch 300 cm Rosen exchange guidewire for an 8 [place] Pinnacle sheath in the right groin which was connected to continuous heparinized saline infusion. Over the exchange guidewire, a Walrus 087 balloon guide catheter which had been prepped with 50% contrast and 50% heparinized saline infusion was advanced and positioned in the proximal 1/3 of the left internal carotid artery. The guidewire was removed. Good aspiration obtained from the hub of the balloon guide catheter. A gentle control arteriogram performed through this demonstrated no evidence of spasms, dissections or of intraluminal filling defects. Over a 0.014 inch standard Synchro micro guidewire with the J configuration, the combination of a 6  French 132 cm Catalyst guide catheter inside of which was an [employer] microcatheter was advanced without difficulty to the supraclinoid left ICA using biplane roadmap technique and constant fluoroscopic guidance. Using a torque device, the micro guidewire was then gently advanced without difficulty into the M2 M3 region of the superior division followed by the microcatheter. The guidewire was removed. Slow aspiration of blood was obtained from the hub of the microcatheter. With gentle proximal retrieval, free aspiration obtained. A control arteriogram performed through the microcatheter now demonstrated partial revascularization of the previously occluded inferior division with multiple filling defects in its proximal 1/3 compatible with clot. Again over a 0.014 inch micro guidewire, the microcatheter was advanced into the M3 region of the branch followed by the microcatheter. The guidewire was removed. At this time, good aspiration obtained from the hub of the microcatheter. This was then connected to continuous heparinized saline infusion. A 3 mm x 33 mm Trevo ProVue retrieval device was then advanced to the distal end of the microcatheter. This was then deployed in the usual manner by the microcatheter. With gentle retrieval and suction, the combination of the retrieval device and the microcatheter were retrieved and removed. Control arteriogram performed through the 6 [place] Catalyst guide catheter now in the proximal M1 segment of the left middle cerebral artery demonstrated near complete revascularization of the superior division. A TICI 3 revascularization was achieved. No evidence of other intraluminal filling defects or of spasm or occlusion was seen. The 6 [place] guide catheter was retrieved and removed. Final control arteriogram performed through the balloon guide catheter in the left common carotid artery demonstrated patency of the internal carotid artery, with a sustained TICI 3  revascularization of the left MCA distribution with wide patency of the left anterior cerebral artery distribution. The balloon guide was retrieved and removed. The 8 French Pinnacle sheath in the right groin was then replaced with an 8 French Angio-Seal closure device with hemostasis. The right groin appeared soft without evidence of hematoma or bleeding. Distal pulses remained palpable  in the dorsalis pedis, and the posterior tibial regions bilaterally unchanged. Throughout the procedure, the patient's blood pressure and neurological status remained stable. A flat panel CT of the brain demonstrated no evidence of bleeding or mass effect. The patient was then extubated without difficulty. Upon recovery, the patient already demonstrated improvement of the motor power in the right arm and leg. However, he continued to have difficulty with expression though demonstrated moderate comprehension. He was then returned to the neuro ICU to continue with post thrombectomy management. IMPRESSION: Status post endovascular revascularization of occluded superior division of the left middle cerebral artery with 1 pass with the 3 mm x 33 mm Trevo ProVue retrieval device and aspiration with a TICI 3 revascularization. PLAN: Follow-up of in the clinic 4 weeks post discharge. Electronically Signed   By: Luanne Bras M.D.   On: 09/08/2019 09:20   CT HEAD CODE STROKE WO CONTRAST  Result Date: 09/07/2019 CLINICAL DATA:  Code stroke. This study identified as missing a report at 1502 hours on 09/07/2019. 69 year old male with code stroke presentation. EXAM: CT HEAD WITHOUT CONTRAST TECHNIQUE: Contiguous axial images were obtained from the base of the skull through the vertex without intravenous contrast. COMPARISON:  Subsequent CTA and CTP today reported separately. FINDINGS: Brain: Normal cerebral volume. No midline shift, mass effect, or evidence of intracranial mass lesion. No acute intracranial hemorrhage identified. No  ventriculomegaly. Gray-white matter differentiation is within normal limits throughout the brain. No cortically based acute infarct identified. Vascular: No suspicious intracranial vascular hyperdensity. Skull: No acute osseous abnormality identified. Sinuses/Orbits: Visualized paranasal sinuses and mastoids are well pneumatized. Other: No acute orbit or scalp soft tissue findings. ASPECTS [facility] Stroke Program Early CT Score) Total score (0-10 with 10 being normal): 10 IMPRESSION: Negative noncontrast head CT. ASPECTS 10. This case was discussed by telephone with Dr. Rory Percy 1502 hours - see CTA and CTP report. Electronically Signed   By: Genevie Ann M.D.   On: 09/07/2019 15:15   VAS [place] LOWER EXTREMITY VENOUS (DVT)  Result Date: 09/10/2019  Lower Venous Study Indications: Stroke.  Comparison Study: No prior study on file Performing Technologist: Sharion Dove RVS  Examination Guidelines: A complete evaluation includes B-mode imaging, spectral Doppler, color Doppler, and power Doppler as needed of all accessible portions of each vessel. Bilateral testing is considered an integral part of a complete examination. Limited examinations for reoccurring indications may be performed as noted.  +---------+---------------+---------+-----------+----------+--------------+ RIGHT    CompressibilityPhasicitySpontaneityPropertiesThrombus Aging +---------+---------------+---------+-----------+----------+--------------+ CFV      Full           Yes      Yes                                 +---------+---------------+---------+-----------+----------+--------------+ SFJ      Full                                                        +---------+---------------+---------+-----------+----------+--------------+ FV Prox  Full                                                        +---------+---------------+---------+-----------+----------+--------------+  FV Mid   Full                                                         +---------+---------------+---------+-----------+----------+--------------+ FV DistalFull                                                        +---------+---------------+---------+-----------+----------+--------------+ PFV      Full                                                        +---------+---------------+---------+-----------+----------+--------------+ POP      Full           Yes      Yes                                 +---------+---------------+---------+-----------+----------+--------------+ PTV      Full                                                        +---------+---------------+---------+-----------+----------+--------------+ PERO     Full                                                        +---------+---------------+---------+-----------+----------+--------------+   +---------+---------------+---------+-----------+----------+--------------+ LEFT     CompressibilityPhasicitySpontaneityPropertiesThrombus Aging +---------+---------------+---------+-----------+----------+--------------+ CFV      Full           Yes      Yes                                 +---------+---------------+---------+-----------+----------+--------------+ SFJ      Full                                                        +---------+---------------+---------+-----------+----------+--------------+ FV Prox  Full                                                        +---------+---------------+---------+-----------+----------+--------------+ FV Mid   Full                                                        +---------+---------------+---------+-----------+----------+--------------+  FV DistalFull                                                        +---------+---------------+---------+-----------+----------+--------------+ PFV      Full                                                         +---------+---------------+---------+-----------+----------+--------------+ POP      Full           Yes      Yes                                 +---------+---------------+---------+-----------+----------+--------------+ PTV      Full                                                        +---------+---------------+---------+-----------+----------+--------------+ PERO     Full                                                        +---------+---------------+---------+-----------+----------+--------------+     Summary: Right: There is no evidence of deep vein thrombosis in the lower extremity. Left: There is no evidence of deep vein thrombosis in the lower extremity.  *See table(s) above for measurements and observations. Electronically signed by Harold Barban MD on 09/10/2019 at 10:42:13 AM.    Final    TEE 09/12/2019 1. No cardiac source of embolus. 2. Negative for PFO 3. No LAA thrombus 4. Dilated sinus of valsalva at 4.3 cm, root measures 4.0 cm, ascending aorta is normal. 5. LVEF 55-60%    HISTORY OF PRESENT ILLNESS DURON HUCKLEBY is a 69 y.o. male who has a past medical history of hyperlipidemia, enlarged thoracic aorta which is stable as of February 06, 2019 from prior studies, presented via EMS for sudden onset of right-sided weakness and aphasia. According to the report, he was speaking to someone over the phone and had a sudden onset of complete cessation of speech.  The patient was found him to have a right facial droop, inability to talk and right-sided weakness.  EMS was called.  They evaluated the patient onsite. LKW [id number] on 09/07/2019. Activated LVO positive code stroke.  Brought into the emergency room. Given presentation that was consistent with a an acute stroke, stat head CT was negative for bleed.  Aspects 10. IV TPA was given within 17 minutes of patient arrival. Further imaging with CT angio and perfusion was performed that showed a left MCA superior division proximal M2  occlusion.  CT perfusion study with a 24 cc core and 71 cc penumbra very favorable for intervention.  Discussed with girlfriend who provided consent. Taken to IR by Dr. Estanislado Pandy. Of note, he has a documented history of ICH in 2013  but no details other than the fact that there was an [place] [facility].  Awaiting for family to provide more information.  CT did not show any sequela of old bleed hence TPA was deemed safe. Premorbid modified Rankin scale (mRS): 0  HOSPITAL COURSE Mr. Kymere Kliewer. is a 69 y.o. male with history of hyperlipidemia and enlarged thoracic aortic aneurysm presenting with right-sided facial droop, aphasia, right-sided weakness.  Received IV TPA 09/07/2019 at 1434. Taken to IR for left M2 occlusion.   Stroke:  left MCA territory infarct s/p tPA and IR w/ TICI3 revascularization L M2, embolic secondary to unknown source  Resultant - aphemia  Code Stroke CT head No acute abnormality. ASPECTS 10.     CTA head & neck ELVO L MCA M2 branch just beyond bifurcation. Mild atherosclerosis  CT perfusion 24 mL core and 71 mL penumbra anterior L MCA territory   Cerebral angio occluded superior division L MCA M2 w/ TICI3 revascularization  MRI - 09/08/19 - Scattered small acute left cerebral infarcts greatest in the frontal operculum. Abnormal appearance of the right transverse sinus most suggestive of occlusion (versus very slow flow due to a distal stenosis).   2D Echo / bubble - EF 60 - 65%. No cardiac source of emboli identified.  LE venous doppler - no DVT  TEE neg for PFO or SOE. Did find a dilated sinus of valsalva. OP f/u with cardiology  Loop recorder placed  LDL 88  HgbA1c 5.4  UDS - negative  No antithrombotic prior to admission, now on ASA 81 and plavix 75 DAPT for 3 weeks and then ASA alone.   Therapy recommendations: OP SLP  Disposition:  Return home   Hyperlipidemia  Home meds:  No statin   On pravastatin 20 (not intensive statin) given  LDL level and pt hx of nausea with statins  LDL 88, goal < 70  Continue statin at discharge  Other Stroke Risk Factors  Advanced age  ETOH use,  advised to drink no more than 2 drink(s) a day  Hx Substance abuse - marijuana  Hx stroke/TIA - unable to find records related to Intracerebral hematoma [facility]) at [facility]. 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 rate 20, height 5\' 8"  (1.727 m), weight 71.4 kg, SpO2 97 %. Pleasant middle-aged Caucasian male not in distress.  Afebrile. Head is nontraumatic. Neck is supple without bruit.    Cardiac exam no murmur or gallop. Lungs are clear to auscultation. Distal pulses are well  felt Neurological Exam :  Awake alert moderate expressive aphasia with significant word hesitancy and paraphasic errors as well as missing syllables.  Able to name with missing syllables. Able to repeat 3-word sentences with missing syllables, not able to repeat complicated sentences.  Good comprehension and able to write fluently. Clinical picture consistent with aphemia. Mild dysarthria.  Extraocular movements full range without nystagmus.  Blinks to threat bilaterally.  Follows commands well.  Mild right lower facial weakness.  Tongue midline.  Motor system exam  symmetric upper and lower extremity strength.  No drift.  No focal weakness.  Sensation, coordination intact and gait not tested.  Discharge Diet   Heart healthy thin liquids  DISCHARGE PLAN  Disposition:  Return home  Outpatient SLP  aspirin 81 mg daily and clopidogrel 75 mg daily for  secondary stroke prevention for 3 weeks then Aspirin alone.  Ongoing stroke risk factor control by Primary Care Physician at time of discharge  Follow-up PCP Kathyrn Lass, MD in 2 weeks.  Follow-up in [facility] in 4 weeks, office to schedule an appointment.  Follow-up [facility] for implantable loop recorder wound check in 10-14 days, office to schedule an appointment.  Loop recorder to be monitored by [facility] for atrial fibrillation as source of stroke. If found, they will notify patient.  Follow-up cardiology - pt requests to follow up with [facility] - appt made w/ Dr. Audie Box at [facility] address for October 09, 2019 at 1340 - echo or cardiac CT to re-evaluate dilated sinus of valsalva.    Letter provided for Jury duty excuse 09/27/19  40 minutes were spent preparing discharge.  Rosalin Hawking, MD PhD Stroke Neurology 09/12/2019 8:43 PM

## 2019-09-12 ENCOUNTER — Encounter (HOSPITAL_COMMUNITY)
Admission: EM | Disposition: A | Discharge status: Home / Self Care | Payer: Self-pay | Source: Home / Self Care | Attending: Neurology

## 2019-09-12 ENCOUNTER — Inpatient Hospital Stay (HOSPITAL_COMMUNITY): Payer: Medicare HMO

## 2019-09-12 ENCOUNTER — Encounter (HOSPITAL_COMMUNITY): Payer: Self-pay | Admitting: Neurology

## 2019-09-12 ENCOUNTER — Inpatient Hospital Stay (HOSPITAL_COMMUNITY): Payer: Medicare HMO | Admitting: Certified Registered Nurse Anesthetist

## 2019-09-12 ENCOUNTER — Encounter (HOSPITAL_COMMUNITY): Payer: Self-pay | Admitting: Student in an Organized Health Care Education/Training Program

## 2019-09-12 DIAGNOSIS — Q2544 Congenital dilation of aorta: Secondary | ICD-10-CM

## 2019-09-12 DIAGNOSIS — I2510 Atherosclerotic heart disease of native coronary artery without angina pectoris: Secondary | ICD-10-CM

## 2019-09-12 DIAGNOSIS — I712 Thoracic aortic aneurysm, without rupture: Secondary | ICD-10-CM

## 2019-09-12 DIAGNOSIS — I6389 Other cerebral infarction: Secondary | ICD-10-CM

## 2019-09-12 DIAGNOSIS — Q2549 Other congenital malformations of aorta: Secondary | ICD-10-CM

## 2019-09-12 DIAGNOSIS — E782 Mixed hyperlipidemia: Secondary | ICD-10-CM

## 2019-09-12 HISTORY — PX: BUBBLE STUDY: SHX6837

## 2019-09-12 HISTORY — PX: TEE WITHOUT CARDIOVERSION: SHX5443

## 2019-09-12 HISTORY — PX: LOOP RECORDER INSERTION: EP1214

## 2019-09-12 SURGERY — LOOP RECORDER INSERTION

## 2019-09-12 SURGERY — ECHOCARDIOGRAM, TRANSESOPHAGEAL
Anesthesia: Monitor Anesthesia Care

## 2019-09-12 MED ORDER — PRAVASTATIN SODIUM 20 MG PO TABS: 20.0000 mg | ORAL_TABLET | Freq: Every day | ORAL | 2 refills | Status: DC

## 2019-09-12 MED ORDER — PROPOFOL 10 MG/ML IV BOLUS
INTRAVENOUS | Status: DC | PRN
  Administered 2019-09-12 (×3): 10 mg via INTRAVENOUS

## 2019-09-12 MED ORDER — PROPOFOL 500 MG/50ML IV EMUL
INTRAVENOUS | Status: DC | PRN
  Administered 2019-09-12: 100 ug/kg/min via INTRAVENOUS

## 2019-09-12 MED ORDER — ASPIRIN 81 MG PO TBEC: 81.0000 mg | DELAYED_RELEASE_TABLET | Freq: Every day | ORAL | Status: DC

## 2019-09-12 MED ORDER — LIDOCAINE-EPINEPHRINE 1 %-1:100000 IJ SOLN
INTRAMUSCULAR | Status: DC | PRN
  Administered 2019-09-12: 30 mL

## 2019-09-12 MED ORDER — CLOPIDOGREL BISULFATE 75 MG PO TABS: 75.0000 mg | ORAL_TABLET | Freq: Every day | ORAL | 0 refills | Status: AC

## 2019-09-12 MED ORDER — LIDOCAINE-EPINEPHRINE 1 %-1:100000 IJ SOLN
INTRAMUSCULAR | Status: AC
  Filled 2019-09-12: qty 1

## 2019-09-12 MED ORDER — SODIUM CHLORIDE 0.9 % IV SOLN: INTRAVENOUS | Status: DC

## 2019-09-12 MED ORDER — SODIUM CHLORIDE 0.9 % IV SOLN: INTRAVENOUS | Status: DC | PRN

## 2019-09-12 SURGICAL SUPPLY — 2 items
MONITOR REVEAL LINQ II (Prosthesis & Implant Heart) ×1 IMPLANT
PACK LOOP INSERTION (CUSTOM PROCEDURE TRAY) ×2 IMPLANT

## 2019-09-12 NOTE — TOC Transition Note (Signed)
Transition of Care [facility]) - CM/SW Discharge Note   Patient Details  Name: Bryce Logan. MRN: [id number] Date of Birth: 09-08-1950  Transition of Care [facility]) CM/SW Contact:  Pollie Friar, RN Phone Number: 09/12/2019, 2:34 PM   Clinical Narrative:    Pt discharging home with outpatient ST.  Outpatient therapy to call for first appointment.  Pt has supervision at home and transportation to home.    Final next level of care: OP Rehab Barriers to Discharge: No Barriers Identified   Patient Goals and CMS Choice     Choice offered to / list presented to : Patient  Discharge Placement                       Discharge Plan and Services   Discharge Planning Services: CM Consult                                 Social Determinants of Health (SDOH) Interventions     Readmission Risk Interventions No flowsheet data found.

## 2019-09-12 NOTE — Discharge Instructions (Signed)
  Implantable Loop Recorder Placement, Care After This sheet gives you information about how to care for yourself after your procedure. Your health care provider may also give you more specific instructions. If you have problems or questions, contact your health care provider. What can I expect after the procedure? After the procedure, it is common to have:  Soreness or discomfort near the incision.  Some swelling or bruising near the incision. Follow these instructions at home: Incision care   Follow instructions from your health care provider about how to take care of your incision. Make sure you: ? Wash your hands with soap and water before you change your bandage (dressing). If soap and water are not available, use hand sanitizer. ? Change your dressing as told by your health care provider. ? Keep your dressing dry. ? Leave stitches (sutures), skin glue, or adhesive strips in place. These skin closures may need to stay in place for 2 weeks or longer. If adhesive strip edges start to loosen and curl up, you may trim the loose edges. Do not remove adhesive strips completely unless your health care provider tells you to do that.  Check your incision area every day for signs of infection. Check for: ? Redness, swelling, or pain. ? Fluid or blood. ? Warmth. ? Pus or a bad smell.  Do not take baths, swim, or use a hot tub until your health care provider approves. Ask your health care provider if you can take showers.

## 2019-09-12 NOTE — Progress Notes (Addendum)
Discharge instructions reviewed with pt by RN Cinda Quest. Copy of instructions given to pt , scripts were sent to pt's pharmacy electronically by MD. [employer] rep to come see pt then he can discharge home.

## 2019-09-12 NOTE — H&P (Signed)
   INTERVAL PROCEDURE H&P  History and Physical Interval Note:  09/12/2019 9:57 AM  Bryce Logan. has presented today for their planned procedure. The various methods of treatment have been discussed with the patient and family. After consideration of risks, benefits and other options for treatment, the patient has consented to the procedure.  The patients' outpatient history has been reviewed, patient examined, and no change in status from most recent office note within the past 30 days. I have reviewed the patients' chart and labs and will proceed as planned. Questions were answered to the patient's satisfaction.   [name], MD, [facility], [place] Director of the Advanced Lipid Disorders &  Cardiovascular Risk Reduction Clinic Diplomate of the American Board of Clinical Lipidology Attending Cardiologist  Direct Dial: [phone]  Fax: [phone]  Website:  [email or web address] 09/12/2019, 9:57 AM

## 2019-09-12 NOTE — Anesthesia Procedure Notes (Signed)
Procedure Name: MAC Date/Time: 09/12/2019 10:05 AM Performed by: Harden Mo, CRNA Pre-anesthesia Checklist: Patient identified, Emergency Drugs available, Suction available and Patient being monitored Patient Re-evaluated:Patient Re-evaluated prior to induction Oxygen Delivery Method: Nasal cannula Preoxygenation: Pre-oxygenation with 100% oxygen Induction Type: IV induction Placement Confirmation: positive ETCO2 and breath sounds checked- equal and bilateral Dental Injury: Teeth and Oropharynx as per pre-operative assessment

## 2019-09-12 NOTE — Anesthesia Preprocedure Evaluation (Addendum)
Anesthesia Evaluation  Patient identified by MRN, date of birth, ID band Patient awake    Reviewed: Allergy & Precautions, NPO status , Patient's Chart, lab work & pertinent test results  Airway Mallampati: II  TM Distance: >3 FB Neck ROM: Full    Dental  (+) Teeth Intact, Dental Advisory Given   Pulmonary neg pulmonary ROS,    breath sounds clear to auscultation       Cardiovascular + CAD   Rhythm:Regular Rate:Bradycardia     Neuro/Psych PSYCHIATRIC DISORDERS Depression Bipolar Disorder CVA    GI/Hepatic negative GI ROS, Neg liver ROS,   Endo/Other  negative endocrine ROS  Renal/GU negative Renal ROS     Musculoskeletal negative musculoskeletal ROS (+)   Abdominal Normal abdominal exam  (+)   Peds  Hematology negative hematology ROS (+)   Anesthesia Other Findings   Reproductive/Obstetrics                            Anesthesia Physical Anesthesia Plan  ASA: II  Anesthesia Plan: MAC   Post-op Pain Management:    Induction: Intravenous  PONV Risk Score and Plan: 0 and Propofol infusion  Airway Management Planned: Natural Airway and Nasal Cannula  Additional Equipment: None  Intra-op Plan:   Post-operative Plan:   Informed Consent: I have reviewed the patients History and Physical, chart, labs and discussed the procedure including the risks, benefits and alternatives for the proposed anesthesia with the patient or authorized representative who has indicated his/her understanding and acceptance.       Plan Discussed with: CRNA  Anesthesia Plan Comments:        Anesthesia Quick Evaluation

## 2019-09-12 NOTE — Progress Notes (Signed)
Late entry Called by [employer] Rep, the patient with wound care instruction follow up questions after reading discharge Dressing and wound care instructions given to the patient by myself via telephone with the device rep assisting  Keep incision clean and dry for 3 days.  You can remove outer dressing tomorrow. Leave steri-strips (little pieces of tape) on until seen in the office for wound check appointment. wound check follow up and office information confirmed to be in his AVS, prior to discharge  The patient had no follow up questions and encouraged to have his girlfriend call if any follow up questions or site concerns.  I will follow up as well.  Tommye Standard, PA-C

## 2019-09-12 NOTE — CV Procedure (Signed)
TRANSESOPHAGEAL ECHOCARDIOGRAM (TEE) NOTE  INDICATIONS: cryptogenic stroke  PROCEDURE:   Informed consent was obtained prior to the procedure. The risks, benefits and alternatives for the procedure were discussed and the patient comprehended these risks.  Risks include, but are not limited to, cough, sore throat, vomiting, nausea, somnolence, esophageal and stomach trauma or perforation, bleeding, low blood pressure, aspiration, pneumonia, infection, trauma to the teeth and death.    After a procedural time-out, the patient was given propofol per anesthesia for sedation.  The patient's heart rate, blood pressure, and oxygen saturation are monitored continuously during the procedure. The transesophageal probe was inserted in the esophagus and stomach without difficulty and multiple views were obtained.  The patient was kept under observation until the patient left the procedure room.  I was present face-to-face 100% of this time. The patient left the procedure room in stable condition.   Agitated microbubble saline contrast was administered.  COMPLICATIONS:    There were no immediate complications.  Findings:  1. LEFT VENTRICLE: The left ventricular wall thickness is normal.  The left ventricular cavity is normal in size. Wall motion is normal.  LVEF is 55-60%.  2. RIGHT VENTRICLE:  The right ventricle is normal in structure and function without any thrombus or masses.    3. LEFT ATRIUM:  The left atrium is normal in size without any thrombus or masses.  There is not spontaneous echo contrast ("smoke") in the left atrium consistent with a low flow state.  4. LEFT ATRIAL APPENDAGE:  The left atrial appendage is free of any thrombus or masses. The appendage has single lobes. Pulse doppler indicates moderate flow in the appendage.  5. ATRIAL SEPTUM:  The atrial septum appears intact and is free of thrombus and/or masses.  There is no evidence for interatrial shunting by color doppler and  saline microbubble.  6. RIGHT ATRIUM:  The right atrium is normal in size and function without any thrombus or masses.  7. MITRAL VALVE:  The mitral valve is normal in structure and function with trivial regurgitation.  There were no vegetations or stenosis.  8. AORTIC VALVE:  The aortic valve is trileaflet, normal in structure and function with no regurgitation.  There were no vegetations or stenosis. The sinus of valsalva is dilated, measuring 4.3 cm.  9. TRICUSPID VALVE:  The tricuspid valve is normal in structure and function with trivial regurgitation.  There were no vegetations or stenosis  10.  PULMONIC VALVE:  The pulmonic valve is normal in structure and function with trace to mild regurgitation.  There were no vegetations or stenosis.   11. AORTIC ARCH, ASCENDING AND DESCENDING AORTA:  There was no Ron Parker et. Al, 1992) atherosclerosis of the ascending aorta, aortic arch, or proximal descending aorta. The proximal ascending aorta measures normal caliber.  12. PULMONARY VEINS: Anomalous pulmonary venous return was not noted.  13. PERICARDIUM: The pericardium appeared normal and non-thickened.  There is no pericardial effusion.  IMPRESSION:   1. No cardiac source of embolus. 2. Negative for PFO 3. No LAA thrombus 4. Dilated sinus of valsalva at 4.3 cm, root measures 4.0 cm, ascending aorta is normal. 5. LVEF 55-60%  RECOMMENDATIONS:    1.  Proceed with ILR - recommend follow-up echo or cardiac CT to re-evaluate dilated sinus of valsalva.  Time Spent Directly with the Patient:  45 minutes   [name], MD, [facility], [place] Director of the Advanced Lipid Disorders &  Cardiovascular Risk Reduction Clinic Diplomate of the American Board of Clinical Lipidology Attending Cardiologist  Direct Dial: [phone]  Fax: [phone]  Website:  [email or web address] 09/12/2019, 10:28 AM

## 2019-09-12 NOTE — Progress Notes (Signed)
Pt ready for discharge except having [employer] rep come to see him and educate pt on the device with loop recorder.  Pt states he was told the [employer] rep was coming today.  Called rep number, directed to call [phone] number, was able to get company to send page to hospital rep, Tomi Bamberger. She has returned call and is coming to see him.

## 2019-09-12 NOTE — Progress Notes (Signed)
  Echocardiogram Echocardiogram Transesophageal has been performed.  Bryce Logan A Ladell Bey 09/12/2019, 10:33 AM

## 2019-09-12 NOTE — Transfer of Care (Signed)
Immediate Anesthesia Transfer of Care Note  Patient: Bryce Logan.  Procedure(s) Performed: TRANSESOPHAGEAL ECHOCARDIOGRAM (TEE) (N/A ) BUBBLE STUDY  Patient Location: Endoscopy Unit  Anesthesia Type:MAC  Level of Consciousness: awake and alert   Airway & Oxygen Therapy: Patient Spontanous Breathing and Patient connected to nasal cannula oxygen  Post-op Assessment: Report given to RN, Post -op Vital signs reviewed and stable and Patient moving all extremities X 4  Post vital signs: Reviewed and stable  Last Vitals:  Vitals Value Taken Time  BP    Temp    Pulse 82 09/12/19 1033  Resp 16 09/12/19 1033  SpO2 98 % 09/12/19 1033  Vitals shown include unvalidated device data.  Last Pain:  Vitals:   09/12/19 0916  TempSrc: Oral  PainSc: 2       Patients Stated Pain Goal: 0 (17/71/16 5790)  Complications: No apparent anesthesia complications

## 2019-09-13 ENCOUNTER — Telehealth: Payer: Self-pay | Admitting: Physician Assistant

## 2019-09-13 ENCOUNTER — Encounter: Payer: Self-pay | Admitting: *Deleted

## 2019-09-13 NOTE — Telephone Encounter (Signed)
Called to follow up on loop site/wound care  The patient's girlfriend Isidoro Donning) answered.  We were on speaker phone and the patient was able to hear (I could hear him answering yes/no to my questions.  The patient yesterday also gave me permission to call and speak with Almyra Free. They had no follow up questions and no concerns on his loop site, we re-reviewed bandage and site care. He inquired about a small pea-size lump/bump he could feel/palpate under his skin at his groin procedure site.  He denied any bruising, no visible swelling, no drainage or bleeding and no pain.  I told him that what was described was not unusual for a groin base arterial procedure.  Instructed to follow his post procedure instructions he was given from that procedure, and on what to be watchful for, and to follow up with neurology or Dr. Estanislado Pandy for any concerns with his site.  Instructed then at his 1st post hospital follow up to make sure that he fills out paperwork to share his information with Almyra Free.  They were appreciative of the follow up, had no other questions.

## 2019-09-14 NOTE — Anesthesia Postprocedure Evaluation (Signed)
Anesthesia Post Note  Patient: Bryce Logan.  Procedure(s) Performed: TRANSESOPHAGEAL ECHOCARDIOGRAM (TEE) (N/A ) BUBBLE STUDY     Patient location during evaluation: PACU Anesthesia Type: MAC Level of consciousness: awake and alert Pain management: pain level controlled Vital Signs Assessment: post-procedure vital signs reviewed and stable Respiratory status: spontaneous breathing, nonlabored ventilation, respiratory function stable and patient connected to nasal cannula oxygen Cardiovascular status: stable and blood pressure returned to baseline Postop Assessment: no apparent nausea or vomiting Anesthetic complications: no    Last Vitals:  Vitals:   09/12/19 1215 09/12/19 1616  BP: 115/84 110/79  Pulse: 70 79  Resp: 20 20  Temp: 36.4 C 36.9 C  SpO2: 97% 99%    Last Pain:  Vitals:   09/12/19 1616  TempSrc: Oral  PainSc:    Pain Goal: Patients Stated Pain Goal: 0 (09/07/19 1700)                 Effie Berkshire

## 2019-09-15 DIAGNOSIS — I639 Cerebral infarction, unspecified: Secondary | ICD-10-CM | POA: Diagnosis not present

## 2019-09-20 ENCOUNTER — Ambulatory Visit: Payer: Medicare HMO | Attending: Neurology | Admitting: Speech Pathology

## 2019-09-20 ENCOUNTER — Other Ambulatory Visit: Payer: Self-pay

## 2019-09-20 DIAGNOSIS — R482 Apraxia: Secondary | ICD-10-CM | POA: Insufficient documentation

## 2019-09-20 NOTE — Patient Instructions (Signed)
   Write down phrases and words you use in your job - practice those  Read aloud several times a day. If it gets too easy, try Dr. Deatra James  If you need to make a phone call practice what you need to say beforehand  Acquired verbal apraxia  Oral apraxia as well  Speech exercises - do 5x each, x2-3/day SLOW BIG  SAY THE FOLLOWING- make every sound! Red leather, yellow leather     Purple baby carriage    [employer] [employer] Proper copper coffee pot Ripe purple cabbage Three free throws [place] [employer], Blue Bulb Flash Message Six Thick Thistles Stick Double Bubble Gum Cinnamon aluminum linoleum Black bugs blood Lovely lemon linament Tying Tape Takes Time A Shifty Salt Shaker   Four floors to cover Unique [place] A Three Toed Tree Toad Knapsack Strap Snap Rubber Baby Buggy Bumpers [employer] Seven Salty Sailors Sailed the [place] Which Wrist Watches are Swiss Wrist Watches

## 2019-09-20 NOTE — Therapy (Signed)
[facility] [address] [place], [place], [address] Phone: [phone]   Fax:  [phone]  Speech Language Pathology Evaluation  Patient Details  Name: Bryce Logan. MRN: [id number] Date of Birth: 1950/11/05 Referring Provider (SLP): Dr. Rosalin Hawking   Encounter Date: 09/20/2019  End of Session - 09/20/19 1117    Visit Number  1    Number of Visits  17       Past Medical History:  Diagnosis Date  . Chronic prostatitis    ELEVATED PSA . ED DR. DAHLSTEDT  . Coronary artery disease   . Depression   . Dizziness 11/11   DR. ROSEN, NORMAL EF, MILD LEAKINESS OF MITRAL VALVE, MILD STIFFNESS OF HEART MUSCLE, MILD ENLARGEMENT OF AORTIC ROOT, REPEAT IN 1 YEAR DR. Radford Pax, NL ETT DR. Radford Pax 12/11  . H/O: GI bleed 2002   DR. HAYES   . Hyperlipidemia   . Insomnia    ON XANAX, DR. Sabra Heck  . Intracerebral hematoma ([place]) 2013   [place]  . Mild bipolar disorder ([place]) 01/2010   DR. PITTMAN DR. Toy Care, CHR DEPRESSION, FATIGUE OFF MEDS FOR AWHILE, CONTROLLED  . Tubular adenoma 09/17/11   [facility]    Past Surgical History:  Procedure Laterality Date  . BUBBLE STUDY  09/12/2019   Procedure: BUBBLE STUDY;  Surgeon: [name], MD;  Location: [facility];  Service: Cardiovascular;;  . COLONOSCOPY  2002  . IR CT HEAD LTD  09/07/2019  . IR PERCUTANEOUS ART THROMBECTOMY/INFUSION INTRACRANIAL INC DIAG ANGIO  09/07/2019  . LEFT KNEE ATHROSCOPY  2002  . LEFT LEG AMBULATORY PHLEBECTOMY  2002  . LOOP RECORDER INSERTION N/A 09/12/2019   Procedure: LOOP RECORDER INSERTION;  Surgeon: Thompson Grayer, MD;  Location: [facility];  Service: Cardiovascular;  Laterality: N/A;  . RADIOLOGY WITH ANESTHESIA N/A 09/07/2019   Procedure: RADIOLOGY WITH ANESTHESIA;  Surgeon: Luanne Bras, MD;  Location: [facility];  Service: Radiology;  Laterality: N/A;  . TEE WITHOUT CARDIOVERSION N/A 09/12/2019   Procedure:  TRANSESOPHAGEAL ECHOCARDIOGRAM (TEE);  Surgeon: [name], MD;  Location: [facility] ENDOS[facility];  Service: Cardiovascular;  Laterality: N/A;    There were no vitals filed for this visit.  Subjective Assessment - 09/20/19 1056    Subjective  "I uh I ttttake un amb..abm..uh Lorrin Mais a piece"    Currently in Pain?  No/denies         SLP Evaluation [facility] - 09/20/19 1056      SLP Visit Information   SLP Received On  09/20/19    Referring Provider (SLP)  Dr. Rosalin Hawking    Onset Date  09/07/19    Medical Diagnosis  CVA      Subjective   Patient/Family Stated Goal  To talk well enough to return to work      General Information   HPI  Bryce Logan is a 69 y.o. male who has a past medical history of hyperlipidemia, enlarged thoracic aorta which is stable as of February 06, 2019 from prior studies, presented via EMS for sudden onset of right-sided weakness and aphasia.  Pt now s/p tPA and successful thrombectomy. Hosptialized 09/07/19 to 09/12/19    Mobility Status  walks independently      Balance Screen   Has the patient fallen in the past 6 months  No    Has the patient had a decrease in activity level because of a fear of falling?   No  Is the patient reluctant to leave their home because of a fear of falling?   No      Prior Functional Status   Cognitive/Linguistic Baseline  Within functional limits    Type of Home  House     Lives With  Alone    Available Support  Friend(s)    Vocation  Part time employment      Cognition   Overall Cognitive Status  Within Functional Limits for tasks assessed      Auditory Comprehension   Overall Auditory Comprehension  Appears within functional limits for tasks assessed    Yes/No Questions  Within Functional Limits    Commands  Within Functional Limits    Conversation  Simple    Other Conversation Comments  affected by motor speech impairment      Reading Comprehension   Reading Status  Within funtional limits      Verbal Expression   Overall  Verbal Expression  Impaired    Initiation  Impaired    Automatic Speech  --   0/7 months articulated correctly   Level of Generative/Spontaneous Verbalization  Conversation    Repetition  Impaired    Level of Impairment  Word level    Naming  No impairment    Pragmatics  No impairment    Interfering Components  Speech intelligibility      Written Expression   Dominant Hand  Right    Written Expression  Within Functional Limits      Oral Motor/Sensory Function   Overall Oral Motor/Sensory Function  Impaired   oral apraxia   Labial ROM  Within Functional Limits    Labial Symmetry  Within Functional Limits    Labial Strength  Within Functional Limits    Labial Sensation  Within Functional Limits    Labial Coordination  Reduced    Lingual ROM  Within Functional Limits    Lingual Symmetry  Within Functional Limits    Lingual Strength  Within Functional Limits    Lingual Sensation  Within Functional Limits    Lingual Coordination  Reduced    Facial ROM  Within Functional Limits    Velum  Within Functional Limits    Mandible  Within Functional Limits      Motor Speech   Respiration  Impaired    Level of Impairment  Phrase    Phonation  Normal    Resonance  Within functional limits    Intelligibility  Intelligibility reduced    Word  75-100% accurate    Phrase  75-100% accurate    Sentence  50-74% accurate    Conversation  50-74% accurate    Motor Planning  Impaired    Level of Impairment  Phrase    Motor Speech Errors  Aware    Effective Techniques  Slow rate;Pause                      SLP Education - 09/20/19 1102    Education Details  HEP for verba apraxia; generate list of professional phrases and sentences; compensations for verbal apraxia    Person(s) Educated  Patient    Methods  Explanation;Demonstration;Verbal cues;Handout    Comprehension  Returned demonstration;Verbal cues required;Need further instruction       SLP Short Term Goals -  09/20/19 1120      SLP SHORT TERM GOAL #1   Title  Pt will complete HEP for verbal apraxia with rare min A over 2 sessions  Time  4    Period  Weeks    Status  New      SLP SHORT TERM GOAL #2   Title  Pt will utilize compensatotry strategies for verbal apraxia with speech errors 3 or less out of 10 words uttered    Time  4    Period  Weeks    Status  New       SLP Long Term Goals - 09/20/19 1122      SLP LONG TERM GOAL #1   Title  Pt will complete HEP with mod I over 2 sessions    Time  8    Period  Weeks    Status  New      SLP LONG TERM GOAL #2   Title  Pt will utilize compensations to have less than 2 apraxic errors out of 10 words uttered    Time  8    Period  Weeks    Status  New      SLP LONG TERM GOAL #3   Title  Pt will demonstrate intelligible articuation of sentences and profressional terms required for him to return to work    Time  8    Period  Weeks    Status  New       Plan - 09/20/19 1117    Clinical Impression Statement  Mr. Catrett is referred for outpt speech therapy by his neurologist due to ongoing speech diffiuclty s/p CVA. Today he presents with moderate acquired verbal and oral apraxia. 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 As a result of verbal apraxia, he is not able to work at this time. He also reports avoiding talking on the phone, During this evaluation, he was observed to abandon his message 3x due to not being able to correct apraxic errors. In rote speech sentence, 5/7 words were in error. 0/12 months were articulated correctly with apraxic errors. Oral apraxia noted on blow and tonue click with grouping. He does report some difficulty initiating a swallow with pills, also indicative of oral apraxia. No other difficulty swallowing, Rapid alternating speech tasks are irregular with halting and groping. Reptition also impaired. Forde Dandy is labored with consistent halting, groping and misarticulations with each utterance today. I recommend skilled ST  to maximize intellgibilty and efficiency of speech for return to teaching guitar. Initiated HEP for verbal apraxia (see pt instructions) and instructed Mr. Wizner to generate a list of professional words and sentences he uses in his work.    Speech Therapy Frequency  2x / week    Duration  --   8 weeks or 17 visits   Treatment/Interventions  Aspiration precaution training;Cueing hierarchy;SLP instruction and feedback;Compensatory strategies;Functional tasks;Compensatory techniques;Internal/external aids;Multimodal communcation approach;Patient/family education    Potential to Achieve Goals  Good       Patient will benefit from skilled therapeutic intervention in order to improve the following deficits and impairments:   Verbal apraxia    Problem List Patient Active Problem List   Diagnosis Date Noted  . Possible Dilatation of aortic sinus of Valsalva 09/12/2019  . Expressive aphasia - Aphemia 09/08/2019  . Cryptogenic stroke ([place]) L MCA s/p tPA and IR 09/07/2019  . Middle cerebral artery embolism, left 09/07/2019  . Dyspnea 02/03/2019  . Chest pain 02/03/2019  . CAD (coronary artery disease) 02/03/2019  . Thoracic aortic aneurysm ([place]) 02/03/2019  . Hyperlipidemia 02/03/2019    Kutler Vanvranken, Annye Rusk MS, CCC-SLP 09/20/2019, 11:25 AM  [facility] [id number]  [place], [place], [address] Phone: [phone]   Fax:  [phone]  Name: Marquale Pavlock. MRN: [id number] Date of Birth: [birth year]

## 2019-09-26 ENCOUNTER — Other Ambulatory Visit: Payer: Self-pay

## 2019-09-26 ENCOUNTER — Ambulatory Visit (INDEPENDENT_AMBULATORY_CARE_PROVIDER_SITE_OTHER): Payer: Medicare HMO | Admitting: *Deleted

## 2019-09-26 DIAGNOSIS — I6602 Occlusion and stenosis of left middle cerebral artery: Secondary | ICD-10-CM

## 2019-09-26 LAB — CUP PACEART INCLINIC DEVICE CHECK
Date Time Interrogation Session: 20210126162953
Implantable Pulse Generator Implant Date: 20210111

## 2019-09-26 NOTE — Progress Notes (Signed)
ILR wound check in clinic. Steri strips not present on exam. Wound well healed. Home monitor transmitting nightly. No episodes. Battery status (Good).  Questions answered. Next home remote 10/13/19.

## 2019-09-27 ENCOUNTER — Ambulatory Visit: Payer: Medicare HMO

## 2019-09-27 ENCOUNTER — Other Ambulatory Visit: Payer: Self-pay

## 2019-09-27 DIAGNOSIS — R482 Apraxia: Secondary | ICD-10-CM | POA: Diagnosis not present

## 2019-09-28 NOTE — Patient Instructions (Signed)
  Take the words we thought of today and add them to your practice regimen.

## 2019-09-28 NOTE — Therapy (Signed)
[facility] [address] [place], [place], [address] Phone: [phone]   Fax:  [phone]  Speech Language Pathology Treatment  Patient Details  Name: Bryce Logan. MRN: [id number] Date of Birth: 1951/08/01 Referring Provider (SLP): Dr. Rosalin Hawking   Encounter Date: 09/27/2019  End of Session - 09/28/19 1324    Visit Number  2    Number of Visits  17    Date for SLP Re-Evaluation  11/15/19    SLP Start Time  1320    SLP Stop Time   1400    SLP Time Calculation (min)  40 min    Activity Tolerance  Patient tolerated treatment well       Past Medical History:  Diagnosis Date  . Chronic prostatitis    ELEVATED PSA . ED DR. DAHLSTEDT  . Coronary artery disease   . Depression   . Dizziness 11/11   DR. ROSEN, NORMAL EF, MILD LEAKINESS OF MITRAL VALVE, MILD STIFFNESS OF HEART MUSCLE, MILD ENLARGEMENT OF AORTIC ROOT, REPEAT IN 1 YEAR DR. Radford Pax, NL ETT DR. Radford Pax 12/11  . H/O: GI bleed 2002   DR. HAYES   . Hyperlipidemia   . Insomnia    ON XANAX, DR. Sabra Heck  . Intracerebral hematoma ([place]) 2013   [place]  . Mild bipolar disorder ([place]) 01/2010   DR. PITTMAN DR. Toy Care, CHR DEPRESSION, FATIGUE OFF MEDS FOR AWHILE, CONTROLLED  . Tubular adenoma 09/17/11   [facility]    Past Surgical History:  Procedure Laterality Date  . BUBBLE STUDY  09/12/2019   Procedure: BUBBLE STUDY;  Surgeon: [name], MD;  Location: [facility];  Service: Cardiovascular;;  . COLONOSCOPY  2002  . IR CT HEAD LTD  09/07/2019  . IR PERCUTANEOUS ART THROMBECTOMY/INFUSION INTRACRANIAL INC DIAG ANGIO  09/07/2019  . LEFT KNEE ATHROSCOPY  2002  . LEFT LEG AMBULATORY PHLEBECTOMY  2002  . LOOP RECORDER INSERTION N/A 09/12/2019   Procedure: LOOP RECORDER INSERTION;  Surgeon: Thompson Grayer, MD;  Location: [facility];  Service: Cardiovascular;  Laterality: N/A;  . RADIOLOGY WITH ANESTHESIA N/A 09/07/2019   Procedure: RADIOLOGY WITH ANESTHESIA;  Surgeon: Luanne Bras, MD;  Location: [facility];  Service: Radiology;  Laterality: N/A;  . TEE WITHOUT CARDIOVERSION N/A 09/12/2019   Procedure: TRANSESOPHAGEAL ECHOCARDIOGRAM (TEE);  Surgeon: [name], MD;  Location: [facility];  Service: Cardiovascular;  Laterality: N/A;    There were no vitals filed for this visit.  Subjective Assessment - 09/27/19 1323    Subjective  "I'm   not    needing   to   write  things down for    my   girlfriend now."    Currently in Pain?  No/denies            ADULT SLP TREATMENT - 09/28/19 0001      General Information   Behavior/Cognition  Cooperative;Alert;Pleasant mood      Treatment Provided   Treatment provided  Cognitive-Linquistic      Pain Assessment   Pain Assessment  No/denies pain      Cognitive-Linquistic Treatment   Treatment focused on  Apraxia    Skilled Treatment  Pt did ont write down common phrases/sentences he would say at a lesson. Pt reports he no longer needs to write down his messages and that he has had some phone conversations. SLP had pt practice his HEP and pt with min-mod halting speech. SLP collaborated with pt  in generating a word list/phrase list for practicing at home by having pt tell SLP some discourse he frequently uses during teaching. "Order" "sequence" "diagram", "finger/fretboard", etc were some of the words pt will practice. Pt states he goes on walks and perofrms his HEP - "sometimes 30 to 60 minutes a day".        Assessment / Recommendations / Plan   Plan  Continue with current plan of care      Progression Toward Goals   Progression toward goals  Progressing toward goals       SLP Education - 09/28/19 1323    Education Details  Why adding pertinent words is practical for pt to practice    Person(s) Educated  Patient    Methods  Explanation    Comprehension  Verbalized understanding       SLP Short Term Goals - 09/28/19 1326      SLP SHORT TERM  GOAL #1   Title  Pt will complete HEP for verbal apraxia with rare min A over 2 sessions    Time  4    Period  Weeks    Status  On-going      SLP SHORT TERM GOAL #2   Title  Pt will utilize compensatotry strategies for verbal apraxia with speech errors 3 or less out of 10 words uttered    Time  4    Period  Weeks    Status  On-going       SLP Long Term Goals - 09/28/19 1326      SLP LONG TERM GOAL #1   Title  Pt will complete HEP with mod I over 2 sessions    Time  8    Period  Weeks    Status  On-going      SLP LONG TERM GOAL #2   Title  Pt will utilize compensations to have less than 2 apraxic errors out of 10 words uttered    Time  8    Period  Weeks    Status  On-going      SLP LONG TERM GOAL #3   Title  Pt will demonstrate intelligible articuation of sentences and profressional terms required for him to return to work    Time  8    Period  Weeks    Status  On-going       Plan - 09/28/19 1324    Clinical Impression Statement  Mr. Kuczmarski is referred for outpt speech therapy by his neurologist due to ongoing speech diffiuclty s/p CVA. Marland Kitchen Reptition also impaired. Forde Dandy is labored with usual halting, occasional groping and misarticulations with each utterance today. Pt reports his speech is better today than at evaluation.  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 Initiated HEP for verbal apraxia (see pt instructions) and instructed Mr. Conway to generate a list of professional words and sentences he uses in his work.    Speech Therapy Frequency  2x / week    Duration  --   8 weeks or 17 visits   Treatment/Interventions  Aspiration precaution training;Cueing hierarchy;SLP instruction and feedback;Compensatory strategies;Functional tasks;Compensatory techniques;Internal/external aids;Multimodal communcation approach;Patient/family education    Potential to Achieve Goals  Good       Patient will benefit from  skilled therapeutic intervention in order to improve the following deficits and impairments:   Verbal apraxia    Problem List Patient Active Problem List   Diagnosis Date Noted  .  Possible Dilatation of aortic sinus of Valsalva 09/12/2019  . Expressive aphasia - Aphemia 09/08/2019  . Cryptogenic stroke ([place]) L MCA s/p tPA and IR 09/07/2019  . Middle cerebral artery embolism, left 09/07/2019  . Dyspnea 02/03/2019  . Chest pain 02/03/2019  . CAD (coronary artery disease) 02/03/2019  . Thoracic aortic aneurysm ([place]) 02/03/2019  . Hyperlipidemia 02/03/2019    [facility] ,[place], [place]  09/28/2019, 1:26 PM  [facility] [address] [place], [place], [address] Phone: [phone]   Fax:  [phone]   Name: Adelaido Kleinfeld. MRN: [id number] Date of Birth: 04-03-1951

## 2019-10-04 ENCOUNTER — Ambulatory Visit: Payer: Medicare HMO | Admitting: Speech Pathology

## 2019-10-04 DIAGNOSIS — F329 Major depressive disorder, single episode, unspecified: Secondary | ICD-10-CM | POA: Diagnosis not present

## 2019-10-04 DIAGNOSIS — N401 Enlarged prostate with lower urinary tract symptoms: Secondary | ICD-10-CM | POA: Diagnosis not present

## 2019-10-04 DIAGNOSIS — I639 Cerebral infarction, unspecified: Secondary | ICD-10-CM | POA: Diagnosis not present

## 2019-10-04 DIAGNOSIS — E7800 Pure hypercholesterolemia, unspecified: Secondary | ICD-10-CM | POA: Diagnosis not present

## 2019-10-05 DIAGNOSIS — I69390 Apraxia following cerebral infarction: Secondary | ICD-10-CM | POA: Diagnosis not present

## 2019-10-08 NOTE — Progress Notes (Signed)
Cardiology Office Note:   Date:  10/09/2019  NAME:  Bryce Logan.    MRN: [id number] DOB:  Mar 11, 1951   PCP:  Kathyrn Lass, MD  Cardiologist:  Evalina Field, MD   Referring MD: Kathyrn Lass, MD   Chief Complaint  Patient presents with  . Hospitalization Follow-up   History of Present Illness:   Bryce Logan. is a 69 y.o. male with a hx of recent left MCA stroke status post TPA, hyperlipidemia, dilated sinus of Valsalva who presents for follow-up of recent stroke.  He reports he is doing well since having a stroke.  He reports he is driving and was not told by neurology to limit his driving.  I reported to him that they may want him to not drive until they clear him.  He reports that he is exercising.  He walks up to 30 minutes/day without any limitations.  He reports he feels like he could walk further.  He denies any chest pain or shortness of breath with this walking.  He does report difficulties with fatigue at times.  This appears to occur periodically during the day.  This does not appear to be cardiac in nature.  His EKG is normal sinus rhythm without any acute ischemic changes and no evidence of prior infarction.  I did review all of his recent imaging studies and he has normal left ventricular function.  No identifiable cause of stroke was reported.  He has a loop recorder in place.  He is transmitting periodically for this.  His most recent LDL cholesterol is not at goal.  He is on pravastatin 20 mg daily.  We will switch him to Crestor today.  He is continued on aspirin 81 mg daily.  Regarding his dilated aorta.  His sinus of Valsalva was demonstrated to be 42 mm in 2016.  On most recent imaging in 2019 this was shown to be 41 mm.  There is no change over a number of years.  I doubt this will be a problem for him.  He has a tricuspid aortic valve.  Problem List 1. Stroke -L MCA s/p tPA -Negative TEE for PFO/LAA thrombus  -mild R ICA plaque/no stenosis L ICA -LDL 88. T cho 158, HDL  57, TG 66 -A1c 5.4 2. Dilated SoV -41 mm CT chest wo contrast 2019  Past Medical History: Past Medical History:  Diagnosis Date  . Chronic prostatitis    ELEVATED PSA . ED DR. DAHLSTEDT  . Coronary artery disease   . Depression   . Dizziness 11/11   DR. ROSEN, NORMAL EF, MILD LEAKINESS OF MITRAL VALVE, MILD STIFFNESS OF HEART MUSCLE, MILD ENLARGEMENT OF AORTIC ROOT, REPEAT IN 1 YEAR DR. Radford Pax, NL ETT DR. Radford Pax 12/11  . H/O: GI bleed 2002   DR. HAYES   . Hyperlipidemia   . Insomnia    ON XANAX, DR. Sabra Heck  . Intracerebral hematoma ([place]) 2013   [place]  . Mild bipolar disorder ([place]) 01/2010   DR. PITTMAN DR. Toy Care, CHR DEPRESSION, FATIGUE OFF MEDS FOR AWHILE, CONTROLLED  . Tubular adenoma 09/17/11   [facility]    Past Surgical History: Past Surgical History:  Procedure Laterality Date  . BUBBLE STUDY  09/12/2019   Procedure: BUBBLE STUDY;  Surgeon: [name], MD;  Location: [facility];  Service: Cardiovascular;;  . COLONOSCOPY  2002  . IR CT HEAD LTD  09/07/2019  . IR PERCUTANEOUS ART THROMBECTOMY/INFUSION INTRACRANIAL INC DIAG ANGIO  09/07/2019  . LEFT KNEE ATHROSCOPY  2002  . LEFT LEG AMBULATORY PHLEBECTOMY  2002  . LOOP RECORDER INSERTION N/A 09/12/2019   Procedure: LOOP RECORDER INSERTION;  Surgeon: Thompson Grayer, MD;  Location: [facility];  Service: Cardiovascular;  Laterality: N/A;  . RADIOLOGY WITH ANESTHESIA N/A 09/07/2019   Procedure: RADIOLOGY WITH ANESTHESIA;  Surgeon: Luanne Bras, MD;  Location: [facility];  Service: Radiology;  Laterality: N/A;  . TEE WITHOUT CARDIOVERSION N/A 09/12/2019   Procedure: TRANSESOPHAGEAL ECHOCARDIOGRAM (TEE);  Surgeon: [name], MD;  Location: [facility] ENDOSCOPY;  Service: Cardiovascular;  Laterality: N/A;    Current Medications: Current Meds  Medication Sig  . ALPRAZolam (XANAX) 0.5 MG tablet Take 1 mg by mouth at bedtime.   . Ascorbic Acid (VITAMIN C) 1000 MG tablet Take 1,000 mg by  mouth daily.  Marland Kitchen aspirin EC 81 MG EC tablet Take 1 tablet (81 mg total) by mouth daily.  Marland Kitchen atorvastatin (LIPITOR) 20 MG tablet   . cetirizine (ZYRTEC) 10 MG tablet Take 10 mg by mouth daily as needed for allergies.  . Cholecalciferol (VITAMIN D3) 1.25 MG (50000 UT) CAPS Take by mouth.  . Coenzyme Q10 (CO Q 10) 100 MG CAPS Take 1 capsule by mouth daily.  . Magnesium 300 MG CAPS Take 300 mg by mouth daily.   . NON FORMULARY LITHIUM OROTATE  . NON FORMULARY Take 1 tablet by mouth daily. Sam-e 400mg  daily  . sildenafil (VIAGRA) 25 MG tablet Take 25 mg by mouth daily as needed for erectile dysfunction.  . Tragacanth (ASTRAGALUS ROOT) POWD by Does not apply route.  Marland Kitchen zolpidem (AMBIEN) 10 MG tablet Take 5 mg by mouth at bedtime as needed for sleep. Reports breaking tablet into 1/3 tablet  . [DISCONTINUED] amphetamine-dextroamphetamine (ADDERALL) 10 MG tablet Take 10 mg by mouth See admin instructions. Takes 2.5mg  -5mg  daily as needed per patient  . [DISCONTINUED] Astaxanthin 4 MG CAPS Take 1 capsule by mouth daily.  . [DISCONTINUED] KRILL OIL PO Take 1 tablet by mouth daily.   . [DISCONTINUED] pravastatin (PRAVACHOL) 20 MG tablet Take 1 tablet (20 mg total) by mouth daily at 6 PM.     Allergies:    Sulfa antibiotics   Social History: Social History   Socioeconomic History  . Marital status: Divorced    Spouse name: Not on file  . Number of children: Not on file  . Years of education: Not on file  . Highest education level: Not on file  Occupational History  . Not on file  Tobacco Use  . Smoking status: Never Smoker  . Smokeless tobacco: Never Used  Substance and Sexual Activity  . Alcohol use: Yes    Alcohol/week: 2.0 - 6.0 standard drinks    Types: 2 - 6 Glasses of wine per week    Comment: 2-6 PER WEEK  . Drug use: Not Currently    Types: Marijuana  . Sexual activity: Not Currently  Other Topics Concern  . Not on file  Social History Narrative  . Not on file   Social  Determinants of Health   Financial Resource Strain:   . Difficulty of Paying Living Expenses: Not on file  Food Insecurity:   . Worried About Charity fundraiser in the Last Year: Not on file  . Ran Out of Food in the Last Year: Not on file  Transportation Needs:   . Lack of Transportation (Medical): Not on file  . Lack of Transportation (Non-Medical): Not on file  Physical Activity:   . Days of Exercise per Week: Not on file  . Minutes of Exercise per Session: Not on file  Stress:   . Feeling of Stress : Not on file  Social Connections:   . Frequency of Communication with Friends and Family: Not on file  . Frequency of Social Gatherings with Friends and Family: Not on file  . Attends Religious Services: Not on file  . Active Member of Clubs or Organizations: Not on file  . Attends Archivist Meetings: Not on file  . Marital Status: Not on file     Family History: The patient's family history includes Alcohol abuse in his son; Alcoholism in his son; Cancer - Other in his mother; Heart disease in his father; Hypertension in his father; Melanoma in his sister.  ROS:   All other ROS reviewed and negative. Pertinent positives noted in the HPI.     EKGs/Labs/Other Studies Reviewed:   The following studies were personally reviewed by me today:  EKG:  EKG is ordered today.  The ekg ordered today demonstrates normal sinus rhythm, heart rate 88, no acute ischemic changes, no evidence of prior infarction, and was personally reviewed by me.   TEE 09/12/2019 1. Left ventricular ejection fraction, by visual estimation, is 55 to  60%. The left ventricle has normal function. There is no left ventricular  hypertrophy.  2. The left ventricle has no regional wall motion abnormalities.  3. Global right ventricle has normal systolic function.The right  ventricular size is normal. No increase in right ventricular wall  thickness.  4. Left atrial size was normal.  5. Right atrial  size was normal.  6. The mitral valve is grossly normal. Trivial mitral valve  regurgitation.  7. The tricuspid valve is grossly normal.  8. The aortic valve is tricuspid. Aortic valve regurgitation is not  visualized.  9. The pulmonic valve was grossly normal. Pulmonic valve regurgitation is  not visualized.  10. 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  11. Aortic dilatation noted.  12. No intracardiac thrombi or masses were visualized.   Recent Labs: 09/07/2019: ALT 28 09/10/2019: BUN 12; Creatinine, Ser 1.01; Hemoglobin 14.1; Platelets 160; Potassium 3.5; Sodium 138   Recent Lipid Panel    Component Value Date/Time   CHOL 158 09/08/2019 0418   TRIG 66 09/08/2019 0418   HDL 57 09/08/2019 0418   CHOLHDL 2.8 09/08/2019 0418   VLDL 13 09/08/2019 0418   LDLCALC 88 09/08/2019 0418    Physical Exam:   VS:  BP 121/78   Pulse 86   Temp 98.3 F (36.8 C)   Ht 5\' 8"  (1.727 m)   Wt 158 lb (71.7 kg)   SpO2 98%   BMI 24.02 kg/m    Wt Readings from Last 3 Encounters:  10/09/19 158 lb (71.7 kg)  09/10/19 157 lb 6.5 oz (71.4 kg)  02/06/19 156 lb 9.6 oz (71 kg)    General: Well nourished, well developed, in no acute distress Heart: Atraumatic, normal size  Eyes: PEERLA, EOMI  Neck: Supple, no JVD Endocrine: No thryomegaly Cardiac: Normal S1, S2; RRR; no murmurs, rubs, or gallops Lungs: Clear to auscultation bilaterally, no wheezing, rhonchi or rales  Abd: Soft, nontender, no hepatomegaly  Ext: No edema, pulses 2+ Musculoskeletal: No deformities, BUE and BLE strength normal and equal Skin: Warm and dry, no rashes   Neuro: Alert and oriented to person, place, time, and situation, CNII-XII grossly intact, no focal deficits  Psych: Normal mood and affect   ASSESSMENT:   Bryce Logan. is a 69 y.o. male who presents for the following: 1. Cryptogenic stroke ([place]) L MCA s/p tPA and IR   2. Enlarged thoracic aorta ([place])   3. Mixed hyperlipidemia     PLAN:    1. Cryptogenic stroke ([place]) L MCA s/p tPA and IR -Recent stroke.  No identifiable cause.  TEE negative.  Loop recorder in place.  We will continue extended monitoring for potential atrial fibrillation. -Continue aspirin for now. -I would like his LDL less than 70.  Stop pravastatin, and start Crestor 10 mg daily.  We will see him back in 2 months for virtual visit.  He will get a lipid profile 1 week before this visit. -Any restrictions such as driving and activities are limited at my recommendation until he sees neurology in follow-up.  2. Enlarged thoracic aorta (HCC) -Sinus of Valsalva 42 mm in 2016, 41 mm in 2019.  He has a tricuspid aortic valve.  I think this is just mild dilation which is likely almost upper limits of normal for his age.  Given stability over the past few years, I see no need for further testing.  We will continue to monitor this for now.  3. Mixed hyperlipidemia -Switch to Crestor as above.  We will recheck in 2 months in follow-up for virtual visit.  Disposition: Return in about 2 months (around 12/07/2019).  Medication Adjustments/Labs and Tests Ordered: Current medicines are reviewed at length with the patient today.  Concerns regarding medicines are outlined above.  Orders Placed This Encounter  Procedures  . Lipid panel  . EKG 12-Lead   Meds ordered this encounter  Medications  . rosuvastatin (CRESTOR) 10 MG tablet    Sig: Take 1 tablet (10 mg total) by mouth daily.    Dispense:  90 tablet    Refill:  3    Patient Instructions  Medication Instructions:  Stop Pravastatin  Start Crestor 10 mg daily.  *If you need a refill on your cardiac medications before your next appointment, please call your pharmacy*  Lab Work: Fasting lipid panel 1 week before your 2 month follow up. If you have labs (blood work) drawn today and your tests are completely normal, you will receive your results only by: Marland Kitchen MyChart Message (if you have MyChart) OR . A paper copy  in the mail If you have any lab test that is abnormal or we need to change your treatment, we will call you to review the results.  Follow-Up: At [facility], you and your health needs are our priority.  As part of our continuing mission to provide you with exceptional heart care, we have created designated Provider Care Teams.  These Care Teams include your primary Cardiologist (physician) and Advanced Practice Providers (APPs -  Physician Assistants and Nurse Practitioners) who all work together to provide you with the care you need, when you need it.  Your next appointment:   2 month(s)  The format for your next appointment:   Virtual  Provider:   Eleonore Chiquito, MD       Signed, Addison Naegeli. Audie Box, [place]  [address] [place], [place] [address] [phone]  10/09/2019 3:02 PM

## 2019-10-09 ENCOUNTER — Other Ambulatory Visit: Payer: Self-pay

## 2019-10-09 ENCOUNTER — Ambulatory Visit: Payer: Medicare HMO | Admitting: Cardiovascular Disease

## 2019-10-09 ENCOUNTER — Encounter: Payer: Self-pay | Admitting: Cardiovascular Disease

## 2019-10-09 VITALS — BP 121/78 | HR 86 | Temp 98.3°F | Ht 68.0 in | Wt 158.0 lb

## 2019-10-09 DIAGNOSIS — I7789 Other specified disorders of arteries and arterioles: Secondary | ICD-10-CM

## 2019-10-09 DIAGNOSIS — I639 Cerebral infarction, unspecified: Secondary | ICD-10-CM | POA: Diagnosis not present

## 2019-10-09 DIAGNOSIS — E782 Mixed hyperlipidemia: Secondary | ICD-10-CM

## 2019-10-09 MED ORDER — ROSUVASTATIN CALCIUM 10 MG PO TABS: 10.0000 mg | ORAL_TABLET | Freq: Every day | ORAL | 3 refills | Status: DC

## 2019-10-09 NOTE — Patient Instructions (Signed)
Medication Instructions:  Stop Pravastatin  Start Crestor 10 mg daily.  *If you need a refill on your cardiac medications before your next appointment, please call your pharmacy*  Lab Work: Fasting lipid panel 1 week before your 2 month follow up. If you have labs (blood work) drawn today and your tests are completely normal, you will receive your results only by: Marland Kitchen MyChart Message (if you have MyChart) OR . A paper copy in the mail If you have any lab test that is abnormal or we need to change your treatment, we will call you to review the results.  Follow-Up: At [facility], you and your health needs are our priority.  As part of our continuing mission to provide you with exceptional heart care, we have created designated Provider Care Teams.  These Care Teams include your primary Cardiologist (physician) and Advanced Practice Providers (APPs -  Physician Assistants and Nurse Practitioners) who all work together to provide you with the care you need, when you need it.  Your next appointment:   2 month(s)  The format for your next appointment:   Virtual  Provider:   Eleonore Chiquito, MD

## 2019-10-11 ENCOUNTER — Ambulatory Visit: Payer: Medicare HMO | Admitting: Speech Pathology

## 2019-10-12 DIAGNOSIS — I69390 Apraxia following cerebral infarction: Secondary | ICD-10-CM | POA: Diagnosis not present

## 2019-10-13 ENCOUNTER — Ambulatory Visit (INDEPENDENT_AMBULATORY_CARE_PROVIDER_SITE_OTHER): Payer: Medicare HMO | Admitting: *Deleted

## 2019-10-13 DIAGNOSIS — I6602 Occlusion and stenosis of left middle cerebral artery: Secondary | ICD-10-CM

## 2019-10-14 LAB — CUP PACEART REMOTE DEVICE CHECK
Date Time Interrogation Session: 20210212223527
Implantable Pulse Generator Implant Date: 20210111

## 2019-10-16 ENCOUNTER — Other Ambulatory Visit: Payer: Self-pay

## 2019-10-16 ENCOUNTER — Ambulatory Visit: Payer: Medicare HMO | Admitting: Adult Health

## 2019-10-16 ENCOUNTER — Encounter: Payer: Self-pay | Admitting: Adult Health

## 2019-10-16 VITALS — BP 118/68 | HR 88 | Temp 97.3°F | Ht 68.0 in | Wt 160.0 lb

## 2019-10-16 DIAGNOSIS — E782 Mixed hyperlipidemia: Secondary | ICD-10-CM | POA: Diagnosis not present

## 2019-10-16 DIAGNOSIS — I639 Cerebral infarction, unspecified: Secondary | ICD-10-CM

## 2019-10-16 DIAGNOSIS — R4701 Aphasia: Secondary | ICD-10-CM | POA: Diagnosis not present

## 2019-10-16 DIAGNOSIS — I10 Essential (primary) hypertension: Secondary | ICD-10-CM | POA: Diagnosis not present

## 2019-10-16 NOTE — Patient Instructions (Addendum)
Continue aspirin 81 mg daily  and crestor  for secondary stroke prevention  Continue to follow up with PCP regarding cholesterol and blood pressure management   You will be called by Dr. Arlean Hopping offices in regard to recommended follow up  You are cleared to return to driving as your residual deficits would not impair your ability to drive  Continue to work with therapy for ongoing speech difficulties   You will be called by our research team for further information on [place] trial   Continue to monitor blood pressure at home  Maintain strict control of hypertension with blood pressure goal below 130/90, diabetes with hemoglobin A1c goal below 6.5% and cholesterol with LDL cholesterol (bad cholesterol) goal below 70 mg/dL. I also advised the patient to eat a healthy diet with plenty of whole grains, cereals, fruits and vegetables, exercise regularly and maintain ideal body weight.  Followup in the future with me in 3 months or call earlier if needed       Thank you for coming to see [place] at [facility]. I hope we have been able to provide you high quality care today.  You may receive a patient satisfaction survey over the next few weeks. We would appreciate your feedback and comments so that we may continue to improve ourselves and the health of our patients.

## 2019-10-16 NOTE — Progress Notes (Signed)
[facility] [address] [place]. [place] [address] [phone]       HOSPITAL FOLLOW UP NOTE  [name]. Date of Birth:  [birth year] Medical Record Number:  [id number]   Reason for Referral:  hospital stroke follow up    CHIEF COMPLAINT:  Chief Complaint  Patient presents with  . Follow-up    Rm9. alone. States that he occassionally gets dizziness briefly since after stroke. Difficulty with speach.     HPI: Bryce Loganis being seen today for in office hospital follow-up regarding cryptogenic left MCA stroke on 09/06/2018.  History obtained from patient and chart review. Reviewed all radiology images and labs personally.  BryceBryce Loganis a 69 y.o.malewith history of hyperlipidemia and enlarged thoracic aortic aneurysm presented on 09/07/2019 with right-sided facial droop, aphasia, right-sided weakness.  Evaluated by stroke team and Dr. Erlinda Hong with stroke work-up revealing left MCA territory infarct as evidenced on MRI s/p TPA and IR with TICI 3 revascularization of left M2 with stroke etiology unknown.  2D echo normal EF without cardiac source of embolus.  Lower extremity venous Doppler negative.  TEE negative for PFO or cardiac source of embolus.  Loop recorder placed for monitoring of atrial fibrillation as possible etiology.  Recommended DAPT for 3 weeks and aspirin alone.  LDL 88 and recommended continuation of pravastatin 20 mg daily.  No prior history of HTN or DM with A1c 5.4.  Other stroke risk factors include advanced age, EtOH use, history of substance abuse, questionable prior history of stroke and CAD.  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  Residual deficits of moderate expressive aphasia, mild dysarthria and mild right lower facial weakness and discharged home with recommendation of outpatient SLP.   Bryce Logan  is a 69 year old male who is being seen today for hospital follow up. Residual deficits of verbal apraxia but does endorse ongoing improvement and continues to participate in speech therapy.  He denies cognitive or memory concerns.  He questions possible return to driving.  Completed 3 weeks DAPT and continues on aspirin alone without bleeding or bruising.  Continues on Crestor 5 mg daily as advised by cardiology without myalgias.  Had difficulty tolerating 10 mg dosage due to increased agitation which she has experienced with use of prior statins.  Blood pressure today 118/68.  Loop recorder is not shown atrial fibrillation thus far.  He does endorse chronic history of episodic vertigo but has not experienced in the past 2 to 3 years.  Vertigo sensation can last up to 8 hours and severely limits daily functioning.  He does endorse worsening sensation with quick head movements.  He states he has previously been evaluated for the symptoms without prior diagnosis.  He does report chronic bilateral consistent tinnitus and mild hearing loss.  He also endorses history of migraines but do not present with vertigo episodes.  No further concerns at this time.    ROS:   14 system review of systems performed and negative with exception of verbal apraxia  PMH:  Past Medical History:  Diagnosis Date  . Chronic prostatitis    ELEVATED PSA . ED DR. DAHLSTEDT  . Coronary artery disease   . Depression   . Dizziness 11/11   DR. ROSEN, NORMAL EF, MILD LEAKINESS OF MITRAL VALVE, MILD STIFFNESS OF HEART MUSCLE, MILD ENLARGEMENT OF AORTIC ROOT, REPEAT  IN [place], [place] ETT DR. Radford Pax 12/11  . H/O: GI bleed 2002   DR. HAYES   . Hyperlipidemia   . Insomnia    ON XANAX, DR. Sabra Heck  . Intracerebral hematoma ([place]) 2013   [place]  . Mild bipolar disorder ([place]) 01/2010   DR. PITTMAN DR. Toy Care, CHR DEPRESSION, FATIGUE OFF MEDS FOR AWHILE, CONTROLLED  . Tubular adenoma 09/17/11   [facility]    PSH:  Past Surgical History:  Procedure Laterality Date  . BUBBLE STUDY  09/12/2019   Procedure: BUBBLE STUDY;  Surgeon: [name], MD;  Location: [place];  Service: Cardiovascular;;  . COLONOSCOPY  2002  . IR CT HEAD LTD  09/07/2019  . IR PERCUTANEOUS ART THROMBECTOMY/INFUSION INTRACRANIAL INC DIAG ANGIO  09/07/2019  . LEFT KNEE ATHROSCOPY  2002  . LEFT LEG AMBULATORY PHLEBECTOMY  2002  . LOOP RECORDER INSERTION N/A 09/12/2019   Procedure: LOOP RECORDER INSERTION;  Surgeon: Thompson Grayer, MD;  Location: [place] CV LAB;  Service: Cardiovascular;  Laterality: N/A;  . RADIOLOGY WITH ANESTHESIA N/A 09/07/2019   Procedure: RADIOLOGY WITH ANESTHESIA;  Surgeon: Luanne Bras, MD;  Location: [place];  Service: Radiology;  Laterality: N/A;  . TEE WITHOUT CARDIOVERSION N/A 09/12/2019   Procedure: TRANSESOPHAGEAL ECHOCARDIOGRAM (TEE);  Surgeon: [name], MD;  Location: [facility] ENDOSCOPY;  Service: Cardiovascular;  Laterality: N/A;    Social History:  Social History   Socioeconomic History  . Marital status: Divorced    Spouse name: Not on file  . Number of children: Not on file  . Years of education: Not on file  . Highest education level: Not on file  Occupational History  . Not on file  Tobacco Use  . Smoking status: Never Smoker  . Smokeless tobacco: Never Used  Substance and Sexual Activity  . Alcohol use: Yes    Alcohol/week: 2.0 - 6.0 standard drinks    Types: 2 - 6 Glasses of wine per week    Comment: 2-6 PER WEEK  . Drug use: Not Currently    Types: Marijuana  . Sexual activity: Not Currently  Other Topics Concern  . Not on file  Social History Narrative  . Not on file   Social Determinants of Health   Financial Resource Strain:   . Difficulty of Paying Living Expenses: Not on file  Food Insecurity:   . Worried About Charity fundraiser in the Last Year: Not on file  . Ran Out of Food in the Last Year: Not on file  Transportation Needs:   .  Lack of Transportation (Medical): Not on file  . Lack of Transportation (Non-Medical): Not on file  Physical Activity:   . Days of Exercise per Week: Not on file  . Minutes of Exercise per Session: Not on file  Stress:   . Feeling of Stress : Not on file  Social Connections:   . Frequency of Communication with Friends and Family: Not on file  . Frequency of Social Gatherings with Friends and Family: Not on file  . Attends Religious Services: Not on file  . Active Member of Clubs or Organizations: Not on file  . Attends Archivist Meetings: Not on file  . Marital Status: Not on file  Intimate Partner Violence:   . Fear of Current or Ex-Partner: Not on file  . Emotionally Abused: Not on file  . Physically Abused: Not on file  . Sexually Abused: Not on file  Family History:  Family History  Problem Relation Age of Onset  . Cancer - Other Mother   . Hypertension Father   . Heart disease Father   . Melanoma Sister   . Alcoholism Son   . Alcohol abuse Son     Medications:   Current Outpatient Medications on File Prior to Visit  Medication Sig Dispense Refill  . ALPRAZolam (XANAX) 0.5 MG tablet Take 1 mg by mouth at bedtime.   1  . Ascorbic Acid (VITAMIN C) 1000 MG tablet Take 1,000 mg by mouth daily.    Marland Kitchen aspirin EC 81 MG EC tablet Take 1 tablet (81 mg total) by mouth daily.    . Cholecalciferol (VITAMIN D3) 1.25 MG (50000 UT) CAPS Take by mouth.    . Coenzyme Q10 (CO Q 10) 100 MG CAPS Take 1 capsule by mouth daily.    . Magnesium 300 MG CAPS Take 300 mg by mouth daily.     . NON FORMULARY LITHIUM OROTATE    . NON FORMULARY Take 1 tablet by mouth daily. Sam-e 400mg  daily    . rosuvastatin (CRESTOR) 10 MG tablet Take 1 tablet (10 mg total) by mouth daily. 90 tablet 3  . UNABLE TO FIND Med Name: ashwao Supplement    . zolpidem (AMBIEN) 10 MG tablet Take 5 mg by mouth at bedtime as needed for sleep. Reports breaking tablet into 1/3 tablet    . sildenafil (VIAGRA) 25  MG tablet Take 25 mg by mouth daily as needed for erectile dysfunction.     No current facility-administered medications on file prior to visit.    Allergies:   Allergies  Allergen Reactions  . Sulfa Antibiotics     SEVERE FATIGUE AND SIDE EFFECTS     Physical Exam  Vitals:   10/16/19 1514  BP: 118/68  Pulse: 88  Temp: (!) 97.3 F (36.3 C)  Weight: 160 lb (72.6 kg)  Height: 5\' 8"  (1.727 m)   Body mass index is 24.33 kg/m. No exam data present  No flowsheet data found.   General: well developed, well nourished,  pleasant middle-age Caucasian male, seated, in no evident distress Head: head normocephalic and atraumatic.   Neck: supple with no carotid or supraclavicular bruits Cardiovascular: regular rate and rhythm, no murmurs Musculoskeletal: no deformity Skin:  no rash/petichiae Vascular:  Normal pulses all extremities   Neurologic Exam Mental Status: Awake and fully alert.   Verbal apraxia with word hesitancy and paraphasic errors.   oriented to place and time. Recent and remote memory intact. Attention span, concentration and fund of knowledge appropriate. Mood and affect appropriate.  Cranial Nerves: Fundoscopic exam reveals sharp disc margins. Pupils equal, briskly reactive to light. Extraocular movements full without nystagmus. Visual fields full to confrontation. Hearing intact. Facial sensation intact. Face, tongue, palate moves normally and symmetrically.  Motor: Normal bulk and tone. Normal strength in all tested extremity muscles. Sensory.: intact to touch , pinprick , position and vibratory sensation.  Coordination: Rapid alternating movements normal in all extremities. Finger-to-nose and heel-to-shin performed accurately bilaterally. Gait and Station: Arises from chair without difficulty. Stance is normal. Gait demonstrates normal stride length and balance Reflexes: 1+ and symmetric. Toes downgoing.     NIHSS  2 Modified Rankin  2    Diagnostic Data  (Labs, Imaging, Testing)   Code Stroke CT head No acute abnormality. ASPECTS 10.   CTA head &neck ELVO L MCA M2 branch just beyond bifurcation.Mild atherosclerosis  CT perfusion 24 mL core  and 71 mL penumbra anterior L MCA territory   Cerebral angio occluded superior division L MCA M2 w/ TICI3 revascularization  MRI- 09/08/19 - Scattered small acute left cerebral infarcts greatest in thefrontal operculum.Abnormal appearance of the right transverse sinus most suggestiveof occlusion (versus very slow flow due to a distal stenosis).   2D Echo / bubble - EF 60 - 65%. No cardiac source of emboli identified.  LE venous doppler - no DVT  TEE neg for PFO or SOE. Did find a dilated sinus of valsalva. OP f/u with cardiology  Loop recorder placed  LDL88  HgbA1c5.4  UDS- negative    ASSESSMENT: Deforrest Hickingbottom. is a 69 y.o. year old male presented with right-sided facial droop, aphasia and right-sided weakness on 09/07/2019 with a stroke work-up revealing left MCA territory infarct s/p TPA and IR with TICI 3 revascularization of left M2 secondary to unknown source with loop recorder placed for possible atrial fibrillation as etiology. Vascular risk factors include HLD, EtOH use, history of substance abuse, questionable history of stroke and CAD.  Residual deficits of verbal apraxia but endorses ongoing improvement.  Also endorses chronic issues with dizziness, tinnitus and hearing loss      PLAN:  1. Cryptogenic left MCA stroke: Continue aspirin 81 mg daily  and pravastatin for secondary stroke prevention.  Loop recorder will continue to be monitored for possible atrial fibrillation.  Maintain strict control of hypertension with blood pressure goal below 130/90, diabetes with hemoglobin A1c goal below 6.5% and cholesterol with LDL cholesterol (bad cholesterol) goal below 70 mg/dL.  I also advised the patient to eat a healthy diet with plenty of whole grains, cereals, fruits and  vegetables, exercise regularly with at least 30 minutes of continuous activity daily and maintain ideal body weight. 2. HLD: Advised to continue current treatment regimen along with continued follow-up with PCP for future prescribing and monitoring of lipid panel 3. Verbal apraxia, poststroke: Advised to continue to participate in speech therapy for ongoing hopeful improvement.  No indication for cognitive/memory impairment visual or physical deficits.  He was cleared to return to driving at this time. 4. Chronic episodic vertigo: Discussion regarding possible etiologies including BPPV vs Mnire's disease vs vestibular migraine.  He is not interested in further evaluation or referrals at this time and is willing to trial vestibular rehab if symptoms represent.  5. He was interested in additional information regarding Remi Haggard trial.  Will notify research team who will be able to provide additional information for further consideration.     Follow up in 3 months or call earlier if needed   Greater than 50% of time during this 45 minute visit was spent on counseling, explanation of diagnosis of cryptogenic left MCA stroke, reviewing risk factor management of HLD, discussion regarding chronic episodic vertigo and possible etiologies, planning of further management along with potential future management, and discussion with patient answered all questions to [place], AGNP-BC  [facility] [address] [place], [place] [address]  Phone [phone] Fax [phone] Note: This document was prepared with digital dictation and possible smart phrase technology. Any transcriptional errors that result from this process are unintentional.

## 2019-10-16 NOTE — Progress Notes (Signed)
ILR Remote 

## 2019-10-18 ENCOUNTER — Encounter: Payer: Medicare HMO | Admitting: Speech Pathology

## 2019-10-19 NOTE — Progress Notes (Signed)
I agree with the above plan 

## 2019-10-20 ENCOUNTER — Other Ambulatory Visit (HOSPITAL_COMMUNITY): Payer: Self-pay | Admitting: Interventional Radiology

## 2019-10-20 ENCOUNTER — Telehealth (HOSPITAL_COMMUNITY): Payer: Self-pay | Admitting: Radiology

## 2019-10-20 DIAGNOSIS — I639 Cerebral infarction, unspecified: Secondary | ICD-10-CM

## 2019-10-20 NOTE — Telephone Encounter (Signed)
Called pt to schedule his stroke f/u appt with Dev for 4 wks p/s DC from hospital. Left VM. JM

## 2019-10-25 ENCOUNTER — Encounter: Payer: Medicare HMO | Admitting: Speech Pathology

## 2019-10-26 DIAGNOSIS — I69390 Apraxia following cerebral infarction: Secondary | ICD-10-CM | POA: Diagnosis not present

## 2019-10-30 ENCOUNTER — Other Ambulatory Visit: Payer: Self-pay

## 2019-10-30 ENCOUNTER — Ambulatory Visit (HOSPITAL_COMMUNITY)
Admission: RE | Admit: 2019-10-30 | Discharge: 2019-10-30 | Disposition: A | Discharge status: Home / Self Care | Payer: Medicare HMO | Source: Ambulatory Visit | Attending: Interventional Radiology | Admitting: Interventional Radiology

## 2019-10-30 DIAGNOSIS — I639 Cerebral infarction, unspecified: Secondary | ICD-10-CM

## 2019-11-01 ENCOUNTER — Encounter: Payer: Medicare HMO | Admitting: Speech Pathology

## 2019-11-02 DIAGNOSIS — I69390 Apraxia following cerebral infarction: Secondary | ICD-10-CM | POA: Diagnosis not present

## 2019-11-03 ENCOUNTER — Other Ambulatory Visit: Payer: Self-pay

## 2019-11-03 ENCOUNTER — Ambulatory Visit (HOSPITAL_COMMUNITY)
Admission: RE | Admit: 2019-11-03 | Discharge: 2019-11-03 | Disposition: A | Discharge status: Home / Self Care | Payer: Medicare HMO | Source: Ambulatory Visit | Attending: Interventional Radiology | Admitting: Interventional Radiology

## 2019-11-03 DIAGNOSIS — Z7982 Long term (current) use of aspirin: Secondary | ICD-10-CM | POA: Diagnosis not present

## 2019-11-03 DIAGNOSIS — R9089 Other abnormal findings on diagnostic imaging of central nervous system: Secondary | ICD-10-CM | POA: Diagnosis not present

## 2019-11-03 DIAGNOSIS — Z8679 Personal history of other diseases of the circulatory system: Secondary | ICD-10-CM | POA: Diagnosis not present

## 2019-11-03 NOTE — Progress Notes (Signed)
Chief Complaint: Patient was seen in consultation today for right TS stenosis/occlusion.  Referring Physician(s): Code Stroke- Rory Percy, Weldon Picking  Supervising Physician: Luanne Bras  Patient Status: [facility] - Out-pt  History of Present Illness: Bryce Logan. is a 69 y.o. male with a past medical history as below, with pertinent past medical history including hyperlipidemia, CAD, CVA 10/2019, dizziness, intracerebral hematoma 2013, GI bleed 2002, chronic prostatitis, insomnia, and depression. He is known to [facility]- he underwent an image-guided cerebral arteriogram with emergent mechanical thrombectomy of left MCA superior division occlusion achieving a TICI 3 revascularization 09/07/2019 by Dr. Estanislado Pandy. During this admission following procedure, MRI brain was obtained 09/08/2019 which revealed a probable right TS stenosis/occlusion. He was discharged home 09/12/2019 in stable condition with OP neurology follow-up and NIR consultation for management of right TS stenosis/occlusion.  MRI brain 09/08/2019: 1. Scattered small acute left cerebral infarcts greatest in the frontal operculum. 2. Abnormal appearance of the right transverse sinus most suggestive of occlusion (versus very slow flow due to a distal stenosis). No associated acute or chronic infarct or hemorrhage.  Patient presents today to discuss management options of right TS stenosis/occlusion. Patient awake and alert sitting in chair. Complains of dysarthria, improved since discharge. States that he sees SLP for this. Complains of difficulty with fine motor movements, improved since discharge. 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 Denies headache, weakness, numbness/tingling, vision changes, or tinnitus.  Currently taking Aspirin 81 mg once daily.   Past Medical History:  Diagnosis Date  . Chronic prostatitis    ELEVATED PSA . ED DR. DAHLSTEDT  . Coronary artery disease   . Depression   .  Dizziness 11/11   DR. ROSEN, NORMAL EF, MILD LEAKINESS OF MITRAL VALVE, MILD STIFFNESS OF HEART MUSCLE, MILD ENLARGEMENT OF AORTIC ROOT, REPEAT IN 1 YEAR DR. Radford Pax, [place] ETT DR. Radford Pax 12/11  . H/O: GI bleed 2002   DR. HAYES   . Hyperlipidemia   . Insomnia    ON XANAX, DR. Sabra Heck  . Intracerebral hematoma ([place]) 2013   [place]  . Mild bipolar disorder ([place]) 01/2010   DR. PITTMAN DR. Toy Care, CHR DEPRESSION, FATIGUE OFF MEDS FOR AWHILE, CONTROLLED  . Tubular adenoma 09/17/11   [facility]    Past Surgical History:  Procedure Laterality Date  . BUBBLE STUDY  09/12/2019   Procedure: BUBBLE STUDY;  Surgeon: [name], MD;  Location: [place];  Service: Cardiovascular;;  . COLONOSCOPY  2002  . IR CT HEAD LTD  09/07/2019  . IR PERCUTANEOUS ART THROMBECTOMY/INFUSION INTRACRANIAL INC DIAG ANGIO  09/07/2019  . LEFT KNEE ATHROSCOPY  2002  . LEFT LEG AMBULATORY PHLEBECTOMY  2002  . LOOP RECORDER INSERTION N/A 09/12/2019   Procedure: LOOP RECORDER INSERTION;  Surgeon: Thompson Grayer, MD;  Location: [place] CV LAB;  Service: Cardiovascular;  Laterality: N/A;  . RADIOLOGY WITH ANESTHESIA N/A 09/07/2019   Procedure: RADIOLOGY WITH ANESTHESIA;  Surgeon: Luanne Bras, MD;  Location: [place];  Service: Radiology;  Laterality: N/A;  . TEE WITHOUT CARDIOVERSION N/A 09/12/2019   Procedure: TRANSESOPHAGEAL ECHOCARDIOGRAM (TEE);  Surgeon: [name], MD;  Location: [facility] ENDOSCOPY;  Service: Cardiovascular;  Laterality: N/A;    Allergies: Sulfa antibiotics  Medications: Prior to Admission medications   Medication Sig Start Date End Date Taking? Authorizing Provider  ALPRAZolam Duanne Moron) 0.5 MG tablet Take 1 mg by mouth at bedtime.  09/21/16   [provider]  Ascorbic Acid (VITAMIN C) 1000 MG tablet Take 1,000 mg by mouth daily.    [provider]  aspirin EC 81 MG EC tablet Take 1 tablet (81 mg total) by mouth daily. 09/13/19   Donzetta Starch, NP    Cholecalciferol (VITAMIN D3) 1.25 MG (50000 UT) CAPS Take by mouth.    [provider]  Coenzyme Q10 (CO Q 10) 100 MG CAPS Take 1 capsule by mouth daily.    [provider]  Magnesium 300 MG CAPS Take 300 mg by mouth daily.     [provider]  NON FORMULARY LITHIUM OROTATE    [provider]  NON FORMULARY Take 1 tablet by mouth daily. Sam-e 400mg  daily    [provider]  rosuvastatin (CRESTOR) 10 MG tablet Take 1 tablet (10 mg total) by mouth daily. Patient taking differently: Take 5 mg by mouth daily. "5 mg daily (takes 0.5 tab of 10 mg pill)" 10/09/19 01/07/20  O'Neal, Cassie Freer, MD  sildenafil (VIAGRA) 25 MG tablet Take 25 mg by mouth daily as needed for erectile dysfunction.    [provider]  UNABLE TO FIND Med Name: ashwao Supplement    [provider]  zolpidem (AMBIEN) 10 MG tablet Take 5 mg by mouth at bedtime as needed for sleep. Reports breaking tablet into 1/3 tablet    [provider]     Family History  Problem Relation Age of Onset  . Cancer - Other Mother   . Hypertension Father   . Heart disease Father   . Melanoma Sister   . Alcoholism Son   . Alcohol abuse Son     Social History   Socioeconomic History  . Marital status: Divorced    Spouse name: Not on file  . Number of children: Not on file  . Years of education: Not on file  . Highest education level: Not on file  Occupational History  . Not on file  Tobacco Use  . Smoking status: Never Smoker  . Smokeless tobacco: Never Used  Substance and Sexual Activity  . Alcohol use: Yes    Alcohol/week: 2.0 - 6.0 standard drinks    Types: 2 - 6 Glasses of wine per week    Comment: 2-6 PER WEEK  . Drug use: Not Currently    Types: Marijuana  . Sexual activity: Not Currently  Other Topics Concern  . Not on file  Social History Narrative  . Not on file   Social Determinants of Health   Financial Resource Strain:   . Difficulty of  Paying Living Expenses: Not on file  Food Insecurity:   . Worried About Charity fundraiser in the Last Year: Not on file  . Ran Out of Food in the Last Year: Not on file  Transportation Needs:   . Lack of Transportation (Medical): Not on file  . Lack of Transportation (Non-Medical): Not on file  Physical Activity:   . Days of Exercise per Week: Not on file  . Minutes of Exercise per Session: Not on file  Stress:   . Feeling of Stress : Not on file  Social Connections:   . Frequency of Communication with Friends and Family: Not on file  . Frequency of Social Gatherings with Friends and Family: Not on file  . Attends Religious Services: Not on file  . Active Member of Clubs or Organizations: Not on file  . Attends Archivist Meetings: Not on file  .  Marital Status: Not on file     Review of Systems: A 12 point ROS discussed and pertinent positives are indicated in the HPI above.  All other systems are negative.  Review of Systems  Constitutional: Negative for chills and fever.  HENT: Negative for tinnitus.   Eyes: Negative for visual disturbance.  Respiratory: Negative for shortness of breath and wheezing.   Cardiovascular: Negative for chest pain and palpitations.  Neurological: Positive for speech difficulty. Negative for weakness, numbness and headaches.  Psychiatric/Behavioral: Negative for behavioral problems and confusion.    Vital Signs: There were no vitals taken for this visit.  Physical Exam Constitutional:      General: He is not in acute distress.    Appearance: Normal appearance.  Pulmonary:     Effort: Pulmonary effort is normal. No respiratory distress.  Skin:    General: Skin is warm and dry.  Neurological:     Mental Status: He is alert and oriented to person, place, and time.  Psychiatric:        Mood and Affect: Mood normal.        Behavior: Behavior normal.      Imaging: CUP PACEART REMOTE DEVICE CHECK  Result Date:  10/14/2019 Carelink summary report received. Battery status OK. Normal device function. No new symptom episodes, tachy episodes, brady, or pause episodes. No new AF episodes. Monthly summary reports and ROV/PRN   Labs:  CBC: Recent Labs    09/07/19 1416 09/07/19 1416 09/07/19 1445 09/08/19 0418 09/09/19 0541 09/10/19 0815  WBC 5.7  --   --  8.1 6.7 5.9  HGB 15.4   < > 15.3 13.7 13.6 14.1  HCT 48.1   < > 45.0 42.1 42.0 42.1  PLT 184  --   --  167 156 160   < > = values in this interval not displayed.    COAGS: Recent Labs    09/07/19 1416  INR 1.0  APTT 26    BMP: Recent Labs    09/07/19 1416 09/07/19 1416 09/07/19 1445 09/08/19 0418 09/09/19 0541 09/10/19 0815  NA 140   < > 139 137 141 138  K 4.0   < > 3.8 3.5 3.6 3.5  CL 109   < > 105 106 110 106  CO2 22  --   --  21* 23 23  GLUCOSE 132*   < > 125* 102* 99 152*  BUN 18   < > 20 10 12 12   CALCIUM 8.7*  --   --  7.9* 8.4* 8.7*  CREATININE 1.03   < > 1.00 0.86 0.93 1.01  GFRNONAA >60  --   --  >60 >60 >60  GFRAA >60  --   --  >60 >60 >60   < > = values in this interval not displayed.    LIVER FUNCTION TESTS: Recent Labs    09/07/19 1416  BILITOT 0.6  AST 22  ALT 28  ALKPHOS 39  PROT 6.1*  ALBUMIN 3.8     Assessment and Plan:  Probable right TS stenosis/occlusion, seen on MRI brain 09/08/2019. Dr. Estanislado Pandy was present for consultation.  Discussed patient's post-procedure course. States that his speech and fine motor movements have improved, however not back to 100%. Advised patient to continue working with SLP, and to continue practicing guitar to improve his fine motor movements. Instructed patient to follow-up with his neurologist regularly.  Discussed MRI findings from 09/08/2019 which revealed a probable right TS stenosis/occlusion. Since patient is grossly asymptomatic  from this at this time, recommend continued conservative management including routine imaging scans to monitor for changes. Plan  for follow-up with MRV head (without contrast) 4 months from recent procedure 09/07/2019. Informed patient that our schedulers will call him to set up this imaging scan. Instructed patient to continue taking Aspirin 81 mg once daily. Instructed patient to stay hydrated by drinking plenty of water. Instructed patient to avoid smoking (of any sorts) at this time.  All questions answered and concerns addressed. Patient conveys understanding and agrees with plan.  Thank you for this interesting consult.  I greatly enjoyed meeting Creede Coniglio. and look forward to participating in their care.  A copy of this report was sent to the requesting provider on this date.  Electronically Signed: Earley Abide, PA-C 11/03/2019, 9:55 AM   I spent a total of 40 Minutes in face to face in clinical consultation, greater than 50% of which was counseling/coordinating care for right TS stenosis/occlusion.

## 2019-11-13 ENCOUNTER — Ambulatory Visit (INDEPENDENT_AMBULATORY_CARE_PROVIDER_SITE_OTHER): Payer: Medicare HMO | Admitting: *Deleted

## 2019-11-13 DIAGNOSIS — I6602 Occlusion and stenosis of left middle cerebral artery: Secondary | ICD-10-CM

## 2019-11-14 LAB — CUP PACEART REMOTE DEVICE CHECK
Date Time Interrogation Session: 20210315223304
Implantable Pulse Generator Implant Date: 20210111

## 2019-11-14 NOTE — Progress Notes (Signed)
ILR Remote 

## 2019-11-16 DIAGNOSIS — I69390 Apraxia following cerebral infarction: Secondary | ICD-10-CM | POA: Diagnosis not present

## 2019-11-23 DIAGNOSIS — I69390 Apraxia following cerebral infarction: Secondary | ICD-10-CM | POA: Diagnosis not present

## 2019-11-24 ENCOUNTER — Telehealth: Payer: Self-pay | Admitting: Cardiovascular Disease

## 2019-11-24 NOTE — Telephone Encounter (Signed)
Pt c/o medication issue:  1. Name of Medication: rosuvastatin (CRESTOR) 10 MG tablet  2. How are you currently taking this medication (dosage and times per day)? 1/2 a tablet by mouth daily   3. Are you having a reaction (difficulty breathing--STAT)? Yes  4. What is your medication issue? Bryce Logan is calling stating he is having a reaction to Crestor. Since starting it he has had darkened urine and worsening depression. He would like to know if he can switch back to pravastatin (PRAVACHOL) 20 MG tablet  instead of continuing Crestor.

## 2019-11-24 NOTE — Telephone Encounter (Signed)
Pt updated and verbalized understanding.  

## 2019-11-24 NOTE — Telephone Encounter (Signed)
Pt sates since starting rosuvastatin 10 mg, he has been experiencing darkened urine and worsening depression. He report cutting the dose back to 5 mg and urine has since become a little clearer. He is questioning if MD recommends he try 5 mg every other day or switch back to pravastatin to see if symptoms improve.

## 2019-11-24 NOTE — Telephone Encounter (Signed)
Keep at 5 mg for now daily. If still no better go to every other day.   [place] T. Audie Box, [place]  [address], [place], [place] [address] [phone]  3:55 PM

## 2019-11-27 DIAGNOSIS — Z6824 Body mass index (BMI) 24.0-24.9, adult: Secondary | ICD-10-CM | POA: Diagnosis not present

## 2019-11-27 DIAGNOSIS — Z8673 Personal history of transient ischemic attack (TIA), and cerebral infarction without residual deficits: Secondary | ICD-10-CM | POA: Diagnosis not present

## 2019-11-27 DIAGNOSIS — E7800 Pure hypercholesterolemia, unspecified: Secondary | ICD-10-CM | POA: Diagnosis not present

## 2019-11-27 DIAGNOSIS — G4700 Insomnia, unspecified: Secondary | ICD-10-CM | POA: Diagnosis not present

## 2019-11-28 DIAGNOSIS — M25562 Pain in left knee: Secondary | ICD-10-CM | POA: Diagnosis not present

## 2019-11-30 ENCOUNTER — Telehealth (HOSPITAL_COMMUNITY): Payer: Self-pay | Admitting: Radiology

## 2019-11-30 ENCOUNTER — Telehealth: Payer: Self-pay | Admitting: Cardiovascular Disease

## 2019-11-30 DIAGNOSIS — R82998 Other abnormal findings in urine: Secondary | ICD-10-CM | POA: Diagnosis not present

## 2019-11-30 DIAGNOSIS — E782 Mixed hyperlipidemia: Secondary | ICD-10-CM | POA: Diagnosis not present

## 2019-11-30 NOTE — Telephone Encounter (Signed)
Noted, thanks!

## 2019-11-30 NOTE — Telephone Encounter (Signed)
Called pt back. Per Dr. Estanislado Pandy he would like to do MRV head w/o now and see pt after the scan. Gave pt that info and he is in agreement with this plan of care. Will call him to set up when insurance Josem Kaufmann is obtained. JM

## 2019-11-30 NOTE — Telephone Encounter (Signed)
Spoke with patient about dark urine - he is on crestor 5mg  QD now. Thinks it is better than it was on 3/26. He is concerned about his liver as well. Asking if this should be checked with his FLP. Ordered CMET and patient plans to come to [place] for labs today - he has fasted.   Routed to MD/LPN as Juluis Rainier

## 2019-11-30 NOTE — Telephone Encounter (Signed)
New message  Patient states that he wants to know if he needs any other lab work before he comes for his lipid panel. The patient states that he has dark urine. Please call to discuss.

## 2019-11-30 NOTE — Telephone Encounter (Signed)
Pt called with new onset sx. He has intermittent headaches, and a whooshing sound off and on in his Lf ear. He was told to call should he develop these sx. I will forward to the [facility] PA for further recommendation. JM

## 2019-12-01 LAB — LIPID PANEL
Chol/HDL Ratio: 2.2 ratio (ref 0.0–5.0)
Cholesterol, Total: 143 mg/dL (ref 100–199)
HDL: 65 mg/dL (ref >39)
LDL Chol Calc (NIH): 62 mg/dL (ref 0–99)
Triglycerides: 84 mg/dL (ref 0–149)
VLDL Cholesterol Cal: 16 mg/dL (ref 5–40)

## 2019-12-01 LAB — COMPREHENSIVE METABOLIC PANEL
ALT: 52 IU/L — ABNORMAL HIGH (ref 0–44)
AST: 40 IU/L (ref 0–40)
Albumin/Globulin Ratio: 2 (ref 1.2–2.2)
Albumin: 4.7 g/dL (ref 3.8–4.8)
Alkaline Phosphatase: 54 IU/L (ref 39–117)
BUN/Creatinine Ratio: 10 (ref 10–24)
BUN: 11 mg/dL (ref 8–27)
Bilirubin Total: 0.6 mg/dL (ref 0.0–1.2)
CO2: 22 mmol/L (ref 20–29)
Calcium: 9.4 mg/dL (ref 8.6–10.2)
Chloride: 103 mmol/L (ref 96–106)
Creatinine, Ser: 1.07 mg/dL (ref 0.76–1.27)
GFR calc Af Amer: 83 mL/min/{1.73_m2} (ref >59)
GFR calc non Af Amer: 72 mL/min/{1.73_m2} (ref >59)
Globulin, Total: 2.3 g/dL (ref 1.5–4.5)
Glucose: 87 mg/dL (ref 65–99)
Potassium: 4.4 mmol/L (ref 3.5–5.2)
Sodium: 139 mmol/L (ref 134–144)
Total Protein: 7 g/dL (ref 6.0–8.5)

## 2019-12-05 ENCOUNTER — Other Ambulatory Visit (HOSPITAL_COMMUNITY): Payer: Self-pay | Admitting: Interventional Radiology

## 2019-12-05 DIAGNOSIS — I639 Cerebral infarction, unspecified: Secondary | ICD-10-CM

## 2019-12-06 NOTE — Progress Notes (Signed)
Virtual Visit via Telephone Note   This visit type was conducted due to national recommendations for restrictions regarding the COVID-19 Pandemic (e.g. social distancing) in an effort to limit this patient's exposure and mitigate transmission in our community.  Due to his co-morbid illnesses, this patient is at least at moderate risk for complications without adequate follow up.  This format is felt to be most appropriate for this patient at this time.  The patient did not have access to video technology/had technical difficulties with video requiring transitioning to audio format only (telephone).  All issues noted in this document were discussed and addressed.  No physical exam could be performed with this format.  Please refer to the patient's chart for his  consent to telehealth for [facility].   The patient was identified using 2 identifiers.  Date:  12/07/2019   ID:  Bryce Logan., DOB Apr 06, 1951, MRN [id number]  Patient Location: Home Provider Location: Office  PCP:  Kathyrn Lass, MD  Cardiologist:  Evalina Field, MD   Evaluation Performed:  Follow-Up Visit  Chief Complaint:  Follow-up  History of Present Illness:    Bryce Jinkins. is a 69 y.o. male with cryptogenic stroke, dilated thoracic aorta, HLD who presents for follow-up. Was seen after a L MCA stroke. Had loop recorder placed. No afib to date. We switched him to crestor given stroke. Most recent lipid profile T chol 143, HDL 65, LDL 62, TG 84. Had issues with crestor and is now down to 5 mg QD. He reports that his urine darkened with crestor and had insomnia. Ok to switch to lipitor. Walking up to 30 minutes every few days without limitations. Able to cut grass without CP or SOB.   Problem List 1. Cryptogenic Stroke -L MCA s/p tPA 09/2019 -Negative TEE for PFO/LAA thrombus  -mild R ICA plaque/no stenosis L ICA -LDL 62 -A1c 5.4 -loop recorder in place  2. Dilated SoV -41 mm CT chest wo contrast 2019  The  patient does not have symptoms concerning for COVID-19 infection (fever, chills, cough, or new shortness of breath).    Past Medical History:  Diagnosis Date  . Chronic prostatitis    ELEVATED PSA . ED DR. DAHLSTEDT  . Coronary artery disease   . Depression   . Dizziness 11/11   DR. ROSEN, NORMAL EF, MILD LEAKINESS OF MITRAL VALVE, MILD STIFFNESS OF HEART MUSCLE, MILD ENLARGEMENT OF AORTIC ROOT, REPEAT IN 1 YEAR DR. Radford Pax, [place] ETT DR. Radford Pax 12/11  . H/O: GI bleed 2002   DR. HAYES   . Hyperlipidemia   . Insomnia    ON XANAX, DR. Sabra Heck  . Intracerebral hematoma ([place]) 2013   [place]  . Mild bipolar disorder ([place]) 01/2010   DR. PITTMAN DR. Toy Care, CHR DEPRESSION, FATIGUE OFF MEDS FOR AWHILE, CONTROLLED  . Tubular adenoma 09/17/11   [facility]   Past Surgical History:  Procedure Laterality Date  . BUBBLE STUDY  09/12/2019   Procedure: BUBBLE STUDY;  Surgeon: [name], MD;  Location: [place];  Service: Cardiovascular;;  . COLONOSCOPY  2002  . IR CT HEAD LTD  09/07/2019  . IR PERCUTANEOUS ART THROMBECTOMY/INFUSION INTRACRANIAL INC DIAG ANGIO  09/07/2019  . LEFT KNEE ATHROSCOPY  2002  . LEFT LEG AMBULATORY PHLEBECTOMY  2002  . LOOP RECORDER INSERTION N/A 09/12/2019   Procedure: LOOP RECORDER INSERTION;  Surgeon: Thompson Grayer, MD;  Location: [place] CV [facility];  Service: Cardiovascular;  Laterality:  N/A;  . RADIOLOGY WITH ANESTHESIA N/A 09/07/2019   Procedure: RADIOLOGY WITH ANESTHESIA;  Surgeon: Luanne Bras, MD;  Location: [place];  Service: Radiology;  Laterality: N/A;  . TEE WITHOUT CARDIOVERSION N/A 09/12/2019   Procedure: TRANSESOPHAGEAL ECHOCARDIOGRAM (TEE);  Surgeon: [name], MD;  Location: [facility] ENDOSCOPY;  Service: Cardiovascular;  Laterality: N/A;     Current Meds  Medication Sig  . ALPRAZolam (XANAX) 0.5 MG tablet Take 1 mg by mouth at bedtime.   . Ascorbic Acid (VITAMIN C) 1000 MG tablet Take 1,000 mg by mouth daily.  Marland Kitchen  aspirin EC 81 MG EC tablet Take 1 tablet (81 mg total) by mouth daily.  . Cholecalciferol (VITAMIN D3) 1.25 MG (50000 UT) CAPS Take by mouth as needed.   . Coenzyme Q10 (CO Q 10) 100 MG CAPS Take 1 capsule by mouth daily.  . Magnesium 300 MG CAPS Take 300 mg by mouth daily.   . NON FORMULARY LITHIUM OROTATE  . NON FORMULARY Take 1 tablet by mouth daily. Sam-e 400mg  daily  . sildenafil (VIAGRA) 25 MG tablet Take 25 mg by mouth daily as needed for erectile dysfunction.  Marland Kitchen UNABLE TO FIND Med Name: ashwao Supplement  . zolpidem (AMBIEN) 10 MG tablet Take 5 mg by mouth at bedtime as needed for sleep. Reports breaking tablet into 1/3 tablet  . [DISCONTINUED] rosuvastatin (CRESTOR) 10 MG tablet Take 1 tablet (10 mg total) by mouth daily. (Patient taking differently: Take 5 mg by mouth daily. )  . [DISCONTINUED] rosuvastatin (CRESTOR) 10 MG tablet Take 5 mg by mouth daily.     Allergies:   Sulfa antibiotics   Social History   Tobacco Use  . Smoking status: Never Smoker  . Smokeless tobacco: Never Used  Substance Use Topics  . Alcohol use: Yes    Alcohol/week: 2.0 - 6.0 standard drinks    Types: 2 - 6 Glasses of wine per week    Comment: 2-6 PER WEEK  . Drug use: Not Currently    Types: Marijuana     Family Hx: The patient's family history includes Alcohol abuse in his son; Alcoholism in his son; Cancer - Other in his mother; Heart disease in his father; Hypertension in his father; Melanoma in his sister.  ROS:   Please see the history of present illness.     All other systems reviewed and are negative.   Prior CV studies:   The following studies were reviewed today:  TEE 09/12/2019 1. Left ventricular ejection fraction, by visual estimation, is 55 to  60%. The left ventricle has normal function. There is no left ventricular  hypertrophy.  2. The left ventricle has no regional wall motion abnormalities.  3. Global right ventricle has normal systolic function.The right    ventricular size is normal. No increase in right ventricular wall  thickness.  4. Left atrial size was normal.  5. Right atrial size was normal.  6. The mitral valve is grossly normal. Trivial mitral valve  regurgitation.  7. The tricuspid valve is grossly normal.  8. The aortic valve is tricuspid. Aortic valve regurgitation is not  visualized.  9. The pulmonic valve was grossly normal. Pulmonic valve regurgitation is  not visualized.  10. 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  11. Aortic dilatation noted.  12. No intracardiac thrombi or masses were visualized.   TTE 09/08/2019 . Left ventricular ejection fraction, by visual estimation, is 60 to  65%. The left ventricle has normal function.  There is no left ventricular  hypertrophy. Normal diastolic function  2. Global right ventricle has normal systolic function.The right  ventricular size is normal. No increase in right ventricular wall  thickness.  3. Left atrial size was normal.  4. Right atrial size was normal.  5. The mitral valve is normal in structure. Trivial mitral valve  regurgitation.  6. The tricuspid valve is normal in structure.  7. The aortic valve is tricuspid. Aortic valve regurgitation is not  visualized. No evidence of aortic valve stenosis.  8. The pulmonic valve was not well visualized. Pulmonic valve  regurgitation is not visualized.  9. TR signal is inadequate for assessing pulmonary artery systolic  pressure.  10. The inferior vena cava is dilated in size with >50% respiratory  variability, suggesting right atrial pressure of 8 mmHg.  11. There is mild dilatation of the aortic root measuring 40 mm.  12. Saline contrast bubble study was negative, with no evidence of any  interatrial shunt.     Labs/Other Tests and Data Reviewed:    EKG:  No ECG reviewed.  Recent Labs: 09/10/2019: Hemoglobin 14.1; Platelets 160 11/30/2019: ALT 52; BUN 11; Creatinine, Ser 1.07;  Potassium 4.4; Sodium 139   Recent Lipid Panel Lab Results  Component Value Date/Time   CHOL 143 11/30/2019 02:55 PM   TRIG 84 11/30/2019 02:55 PM   HDL 65 11/30/2019 02:55 PM   CHOLHDL 2.2 11/30/2019 02:55 PM   CHOLHDL 2.8 09/08/2019 04:18 AM   LDLCALC 62 11/30/2019 02:55 PM    Wt Readings from Last 3 Encounters:  12/07/19 162 lb (73.5 kg)  10/16/19 160 lb (72.6 kg)  10/09/19 158 lb (71.7 kg)     Objective:    Vital Signs:  BP 110/66 Comment: bp last week  Pulse 88   Ht 5\' 8"  (1.727 m)   Wt 162 lb (73.5 kg)   SpO2 98%   BMI 24.63 kg/m    VITAL SIGNS:  reviewed  Gen: NAD Pulm: No SOB, talking full sentences Psych: normal mood/affect   ASSESSMENT & PLAN:    1. Cryptogenic stroke (HCC) L MCA s/p tPA and IR -no Afib on loop so far  -continue ASA 81 mg QD -LDL at goal <70  2. Dilated aortic root (HCC) -SoV 41 mm in 2019 -likely normal for age  64. Mixed hyperlipidemia -reports darkened urine and insomnia with crestor. Will stop it.  -start lipitor 10 mg QD. He may tolerate better. If not, will go back to pravastatin.   COVID-19 Education: The signs and symptoms of COVID-19 were discussed with the patient and how to seek care for testing (follow up with PCP or arrange E-visit).  The importance of social distancing was discussed today.  Time:   Today, I have spent 25 minutes with the patient with telehealth technology discussing the above problems.    Medication Adjustments/Labs and Tests Ordered: Current medicines are reviewed at length with the patient today.  Concerns regarding medicines are outlined above.   Tests Ordered: No orders of the defined types were placed in this encounter.  Medication Changes: Meds ordered this encounter  Medications  . atorvastatin (LIPITOR) 10 MG tablet    Sig: Take 1 tablet (10 mg total) by mouth daily.    Dispense:  90 tablet    Refill:  3    Follow Up:  In Person 6 months   Signed, Evalina Field, MD  12/07/2019  4:54 PM    [place]

## 2019-12-07 ENCOUNTER — Telehealth (INDEPENDENT_AMBULATORY_CARE_PROVIDER_SITE_OTHER): Payer: Medicare HMO | Admitting: Cardiovascular Disease

## 2019-12-07 ENCOUNTER — Encounter: Payer: Self-pay | Admitting: Cardiovascular Disease

## 2019-12-07 VITALS — BP 110/66 | HR 88 | Ht 68.0 in | Wt 162.0 lb

## 2019-12-07 DIAGNOSIS — I639 Cerebral infarction, unspecified: Secondary | ICD-10-CM

## 2019-12-07 DIAGNOSIS — I77810 Thoracic aortic ectasia: Secondary | ICD-10-CM

## 2019-12-07 DIAGNOSIS — E782 Mixed hyperlipidemia: Secondary | ICD-10-CM

## 2019-12-07 MED ORDER — ATORVASTATIN CALCIUM 10 MG PO TABS: 10.0000 mg | ORAL_TABLET | Freq: Every day | ORAL | 3 refills | Status: DC

## 2019-12-07 NOTE — Patient Instructions (Signed)
Medication Instructions:  Stop Crestor Start Lipitor 10 mg daily   *If you need a refill on your cardiac medications before your next appointment, please call your pharmacy*   Follow-Up: At [facility], you and your health needs are our priority.  As part of our continuing mission to provide you with exceptional heart care, we have created designated Provider Care Teams.  These Care Teams include your primary Cardiologist (physician) and Advanced Practice Providers (APPs -  Physician Assistants and Nurse Practitioners) who all work together to provide you with the care you need, when you need it.  We recommend signing up for the patient portal called "MyChart".  Sign up information is provided on this After Visit Summary.  MyChart is used to connect with patients for Virtual Visits (Telemedicine).  Patients are able to view lab/test results, encounter notes, upcoming appointments, etc.  Non-urgent messages can be sent to your provider as well.   To learn more about what you can do with MyChart, go to [email or web address].    Your next appointment:   6 month(s)  The format for your next appointment:   In Person  Provider:   Eleonore Chiquito, MD

## 2019-12-08 ENCOUNTER — Ambulatory Visit (HOSPITAL_COMMUNITY)
Admission: RE | Admit: 2019-12-08 | Discharge: 2019-12-08 | Disposition: A | Discharge status: Home / Self Care | Payer: Medicare HMO | Source: Ambulatory Visit | Attending: Interventional Radiology | Admitting: Interventional Radiology

## 2019-12-08 ENCOUNTER — Encounter (HOSPITAL_COMMUNITY): Payer: Self-pay | Admitting: *Deleted

## 2019-12-08 ENCOUNTER — Other Ambulatory Visit: Payer: Self-pay

## 2019-12-08 DIAGNOSIS — I639 Cerebral infarction, unspecified: Secondary | ICD-10-CM | POA: Diagnosis not present

## 2019-12-08 DIAGNOSIS — Z8673 Personal history of transient ischemic attack (TIA), and cerebral infarction without residual deficits: Secondary | ICD-10-CM | POA: Diagnosis not present

## 2019-12-11 ENCOUNTER — Other Ambulatory Visit (HOSPITAL_COMMUNITY): Payer: Self-pay | Admitting: Interventional Radiology

## 2019-12-11 ENCOUNTER — Telehealth (HOSPITAL_COMMUNITY): Payer: Self-pay

## 2019-12-11 DIAGNOSIS — I639 Cerebral infarction, unspecified: Secondary | ICD-10-CM

## 2019-12-11 DIAGNOSIS — H9312 Tinnitus, left ear: Secondary | ICD-10-CM

## 2019-12-11 DIAGNOSIS — R519 Headache, unspecified: Secondary | ICD-10-CM

## 2019-12-11 NOTE — Telephone Encounter (Signed)
Called to schedule consult, no answer, left vm. AW  

## 2019-12-12 ENCOUNTER — Other Ambulatory Visit: Payer: Self-pay

## 2019-12-12 NOTE — Patient Outreach (Signed)
First telephone outreach attempt to obtain mRS. No answer. Left message for returned call.  Ina Homes [facility] Management Assistant [phone]

## 2019-12-14 ENCOUNTER — Ambulatory Visit (INDEPENDENT_AMBULATORY_CARE_PROVIDER_SITE_OTHER): Payer: Medicare HMO | Admitting: *Deleted

## 2019-12-14 ENCOUNTER — Other Ambulatory Visit: Payer: Self-pay

## 2019-12-14 ENCOUNTER — Ambulatory Visit (HOSPITAL_COMMUNITY)
Admission: RE | Admit: 2019-12-14 | Discharge: 2019-12-14 | Disposition: A | Discharge status: Home / Self Care | Payer: Medicare HMO | Source: Ambulatory Visit | Attending: Interventional Radiology | Admitting: Interventional Radiology

## 2019-12-14 ENCOUNTER — Ambulatory Visit (HOSPITAL_COMMUNITY): Payer: Medicare HMO

## 2019-12-14 DIAGNOSIS — H9312 Tinnitus, left ear: Secondary | ICD-10-CM

## 2019-12-14 DIAGNOSIS — I6602 Occlusion and stenosis of left middle cerebral artery: Secondary | ICD-10-CM | POA: Diagnosis not present

## 2019-12-14 DIAGNOSIS — R519 Headache, unspecified: Secondary | ICD-10-CM

## 2019-12-14 DIAGNOSIS — I639 Cerebral infarction, unspecified: Secondary | ICD-10-CM

## 2019-12-14 NOTE — Consult Note (Signed)
Chief Complaint: Patient was seen in consultation today for symptom and MRV review  Referring Physician(s): Adaline Sill  Supervising Physician: Luanne Bras  Patient Status: [facility] - Out-pt  History of Present Illness: Bryce Logan. is a 69 y.o. male with a past medical history as below, with pertinent past medical history significant for depression, bipolar disorder, insomnia, HLD, CAD, dizziness, intracerebral hematoma (2013), GI bleed (2002), chronic prostatitis and CVA who presents today to discuss new onset of intermittent headaches and whooshing sound in his left ear as well as recent MRV results. Briefly, [name] presented to [facility] ED on 09/07/19 with complaints of sudden onset right sided weakness and aphasia. Imaging showed an embolism within the left middle cerebral artery for which he was given tPA and underwent emergent mechanical thrombectomy in NIR achieving TICI 3 revascularization. He was admitted for further evaluation and management by neurology service. He underwent MRI brain w/o contrast the following day which showed scattered small acute left cerebral infarcts greatest in the frontal operculum as well as abnormal appearance of the right transverse sinus most suggestive of occlusion. He was discharged on 09/12/19 with planned outpatient follow up with neurology and NIR. He was seen in NIR on 3/5 and reported continued dysarthria and difficulty with fine motor control - he was instructed to continue working with SLP/OT and continue ASA 81 mg QD with plans to undergo MRV w/o contrast 4 months from procedure date. Unfortunately he began to experience intermittent headaches and a whooshing sound in his left ear which he reported to NIR on 4/5. He underwent MRV on 4/9 and is here today to discuss the results.  [name] states that he has had a high pitched ringing sound in both ears for several years with left greater than right, he had previously seen ENT for this  issueexperienced a "whooshing" sound in his left ear one night which resolved during the day, however the following night the same sound returned. He states that he tried to walk around/change position to see if this changed the sound at all but it did not make any difference. He does notice that the sound was louder when he laid down which made it difficult to sleep. His only other symptom at the time was a mild headache near the base of skull however this also occurs at other times and he thinks may be related to muscle pain, he does not take any medicine for these headaches and they do not affect his daily life. He also endorses a history of migraines with aura as well as extreme dizziness that was incapacitating, he was diagnosed with BPPV previously however this has not reoccurred in several years. He states that the whooshing sound only occurred those two nights and has not reoccurred since. He has been having trouble sleeping which is not abnormal for him and he typically takes xanax for sleep as well as ambien at times. He denies any symptoms today.   Past Medical History:  Diagnosis Date  . Chronic prostatitis    ELEVATED PSA . ED DR. DAHLSTEDT  . Coronary artery disease   . Depression   . Dizziness 11/11   DR. ROSEN, NORMAL EF, MILD LEAKINESS OF MITRAL VALVE, MILD STIFFNESS OF HEART MUSCLE, MILD ENLARGEMENT OF AORTIC ROOT, REPEAT IN 1 YEAR DR. Radford Pax, [place] ETT DR. Radford Pax 12/11  . H/O: GI bleed 2002   DR. HAYES   . Hyperlipidemia   . Insomnia    ON XANAX, DR. Sabra Heck  .  Intracerebral hematoma ([place]) 2013   [place]  . Mild bipolar disorder ([place]) 01/2010   DR. PITTMAN DR. Toy Care, CHR DEPRESSION, FATIGUE OFF MEDS FOR AWHILE, CONTROLLED  . Tubular adenoma 09/17/11   [facility]    Past Surgical History:  Procedure Laterality Date  . BUBBLE STUDY  09/12/2019   Procedure: BUBBLE STUDY;  Surgeon: [name], MD;  Location: [place];  Service:  Cardiovascular;;  . COLONOSCOPY  2002  . IR CT HEAD LTD  09/07/2019  . IR PERCUTANEOUS ART THROMBECTOMY/INFUSION INTRACRANIAL INC DIAG ANGIO  09/07/2019  . LEFT KNEE ATHROSCOPY  2002  . LEFT LEG AMBULATORY PHLEBECTOMY  2002  . LOOP RECORDER INSERTION N/A 09/12/2019   Procedure: LOOP RECORDER INSERTION;  Surgeon: Thompson Grayer, MD;  Location: [place] CV LAB;  Service: Cardiovascular;  Laterality: N/A;  . RADIOLOGY WITH ANESTHESIA N/A 09/07/2019   Procedure: RADIOLOGY WITH ANESTHESIA;  Surgeon: Luanne Bras, MD;  Location: [place];  Service: Radiology;  Laterality: N/A;  . TEE WITHOUT CARDIOVERSION N/A 09/12/2019   Procedure: TRANSESOPHAGEAL ECHOCARDIOGRAM (TEE);  Surgeon: [name], MD;  Location: [facility] ENDOSCOPY;  Service: Cardiovascular;  Laterality: N/A;    Allergies: Sulfa antibiotics  Medications: Prior to Admission medications   Medication Sig Start Date End Date Taking? Authorizing Provider  ALPRAZolam Duanne Moron) 0.5 MG tablet Take 1 mg by mouth at bedtime.  09/21/16   [provider]  Ascorbic Acid (VITAMIN C) 1000 MG tablet Take 1,000 mg by mouth daily.    [provider]  aspirin EC 81 MG EC tablet Take 1 tablet (81 mg total) by mouth daily. 09/13/19   Donzetta Starch, NP  atorvastatin (LIPITOR) 10 MG tablet Take 1 tablet (10 mg total) by mouth daily. 12/07/19 03/06/20  O'NealCassie Freer, MD  Cholecalciferol (VITAMIN D3) 1.25 MG (50000 UT) CAPS Take by mouth as needed.     [provider]  Coenzyme Q10 (CO Q 10) 100 MG CAPS Take 1 capsule by mouth daily.    [provider]  Magnesium 300 MG CAPS Take 300 mg by mouth daily.     [provider]  NON FORMULARY LITHIUM OROTATE    [provider]  NON FORMULARY Take 1 tablet by mouth daily. Sam-e 400mg  daily    [provider]  sildenafil (VIAGRA) 25 MG tablet Take 25 mg by mouth daily as needed for erectile dysfunction.    [provider]  UNABLE TO FIND Med  Name: ashwao Supplement    [provider]  zolpidem (AMBIEN) 10 MG tablet Take 5 mg by mouth at bedtime as needed for sleep. Reports breaking tablet into 1/3 tablet    [provider]     Family History  Problem Relation Age of Onset  . Cancer - Other Mother   . Hypertension Father   . Heart disease Father   . Melanoma Sister   . Alcoholism Son   . Alcohol abuse Son     Social History   Socioeconomic History  . Marital status: Divorced    Spouse name: Not on file  . Number of children: Not on file  . Years of education: Not on file  . Highest education level: Not on file  Occupational History  . Not on file  Tobacco Use  . Smoking status: Never Smoker  . Smokeless tobacco: Never Used  Substance and Sexual Activity  . Alcohol use: Yes    Alcohol/week: 2.0 - 6.0 standard drinks  Types: 2 - 6 Glasses of wine per week    Comment: 2-6 PER WEEK  . Drug use: Not Currently    Types: Marijuana  . Sexual activity: Not Currently  Other Topics Concern  . Not on file  Social History Narrative  . Not on file   Social Determinants of Health   Financial Resource Strain:   . Difficulty of Paying Living Expenses:   Food Insecurity:   . Worried About Charity fundraiser in the Last Year:   . Arboriculturist in the Last Year:   Transportation Needs:   . Film/video editor (Medical):   Marland Kitchen Lack of Transportation (Non-Medical):   Physical Activity:   . Days of Exercise per Week:   . Minutes of Exercise per Session:   Stress:   . Feeling of Stress :   Social Connections:   . Frequency of Communication with Friends and Family:   . Frequency of Social Gatherings with Friends and Family:   . Attends Religious Services:   . Active Member of Clubs or Organizations:   . Attends Archivist Meetings:   Marland Kitchen Marital Status:      Review of Systems: A 12 point ROS discussed and pertinent positives are indicated in the HPI above.  All other systems are  negative.  Review of Systems  Constitutional: Negative for chills and fever.  HENT: Positive for tinnitus. Negative for hearing loss and trouble swallowing.   Respiratory: Negative for cough and shortness of breath.   Gastrointestinal: Negative for abdominal pain, nausea and vomiting.  Musculoskeletal: Negative for arthralgias and myalgias.  Neurological: Positive for dizziness, speech difficulty, light-headedness and headaches (intermittent). Negative for seizures, syncope, facial asymmetry, weakness and numbness.    Vital Signs: There were no vitals taken for this visit.  Physical Exam Constitutional:      General: He is not in acute distress. HENT:     Head: Normocephalic.  Pulmonary:     Effort: Pulmonary effort is normal.  Skin:    General: Skin is warm and dry.  Neurological:     Mental Status: He is alert and oriented to person, place, and time.     Gait: Gait normal.     Comments: (+) dysarthria  Psychiatric:        Mood and Affect: Mood normal.        Behavior: Behavior normal.        Thought Content: Thought content normal.        Judgment: Judgment normal.      Imaging: MR MRV HEAD WO CM  Result Date: 12/08/2019 CLINICAL DATA:  History of left MCA occlusion and stroke. History of abnormal right transverse sinus. EXAM: MR VENOGRAM OF THE HEAD WITHOUT CONTRAST TECHNIQUE: Angiographic images of the intracranial venous structures were obtained using MRV technique without intravenous contrast. COMPARISON:  MRI 09/08/2019.  CT angiography 09/07/2019. FINDINGS: Superior sagittal sinus is patent and normal. Deep veins are patent and normal. Right transverse sinus and sigmoid sinus are patent and normal with good flow in the right jugular vein. Left transverse sinus again shows an absence of flow in the proximal 2 cm with some flow distal to that due to inflow of other small veins. This is consistent with chronic proximal right transverse sinus thrombosis. Question if this is  total or partial based on the MRI. That could be determined more accurately with contrast enhanced CT venography, though that is not specifically recommended. IMPRESSION: Continued appearance of abnormal  flow within the right transverse sinus. No convincing flow demonstrated in the proximal 2 cm. Flow distal to that in the more distal right transverse sinus, sigmoid sinus and jugular vein. Question if this is complete thrombosis or partial thrombosis. See above discussion. Electronically Signed   By: Nelson Chimes M.D.   On: 12/08/2019 16:14    Labs:  CBC: Recent Labs    09/07/19 1416 09/07/19 1416 09/07/19 1445 09/08/19 0418 09/09/19 0541 09/10/19 0815  WBC 5.7  --   --  8.1 6.7 5.9  HGB 15.4   < > 15.3 13.7 13.6 14.1  HCT 48.1   < > 45.0 42.1 42.0 42.1  PLT 184  --   --  167 156 160   < > = values in this interval not displayed.    COAGS: Recent Labs    09/07/19 1416  INR 1.0  APTT 26    BMP: Recent Labs    09/08/19 0418 09/09/19 0541 09/10/19 0815 11/30/19 1452  NA 137 141 138 139  K 3.5 3.6 3.5 4.4  CL 106 110 106 103  CO2 21* 23 23 22   GLUCOSE 102* 99 152* 87  BUN 10 12 12 11   CALCIUM 7.9* 8.4* 8.7* 9.4  CREATININE 0.86 0.93 1.01 1.07  GFRNONAA >60 >60 >60 72  GFRAA >60 >60 >60 83    LIVER FUNCTION TESTS: Recent Labs    09/07/19 1416 11/30/19 1452  BILITOT 0.6 0.6  AST 22 40  ALT 28 52*  ALKPHOS 39 54  PROT 6.1* 7.0  ALBUMIN 3.8 4.7    TUMOR MARKERS: No results for input(s): AFPTM, CEA, CA199, CHROMGRNA in the last 8760 hours.  Assessment and Plan:  69 y/o M who presented to [facility] ED on 09/07/19 with complaints of sudden onset right sided weakness and aphasia. Imaging showed an embolism within the left middle cerebral artery for which he was given tPA and underwent emergent mechanical thrombectomy in NIR achieving TICI 3 revascularization. He has been doing fairly well overall, continues to have some dysarthria but no other significant neurologic  symptoms until 4/5 when he noticed a whooshing sound in his left ear x 2 nights. The sound was not associated with any other neurologic symptoms, was unchanged with position/activity and resolved without other intervention. He reported these symptoms to our office and subsequently underwent MRV w/o contrast on 4/9 and he presents today to discuss those results.  MRV Head w/o contrast 12/08/19: Superior sagittal sinus is patent and normal. Deep veins are patent and normal. Right transverse sinus and sigmoid sinus are patent and normal with good flow in the right jugular vein. Left transverse sinus again shows an absence of flow in the proximal 2 cm with some flow distal to that due to inflow of other small veins. This is consistent with chronic proximal right transverse sinus thrombosis.  Dr. Estanislado Pandy was present for consultation.  Discussed function/anatomy of cerebral venous sinuses as well as unchanged appearance of abnormal flow within the right transverse sinus on most recent imaging. We also discussed that it is unlikely this issue is congenital given the normal caliber of both IJs and is more likely secondary to clot, although there appears to be adequate blood flow at this time. We also discussed that the risks of proceeding with thrombectomy outweighs potential benefit at this time and would recommend continued conservative management.   Plan for follow-up:  1. Continue ASA 81 mg QD 2. Continue HLD/HTN control 3. Maintain adequate hydration (per  patient drinks 1 gallon of water QD which does not need to be increased) 4. Continue to monitor closely for any concerning neurologic symptoms including, but not limited to, worsening headaches, new or worsening tinnitus, dizziness, facial droop, gait disturbance, change in mental status. If any concerning symptoms occur patient instructed to call our office or 911 if severe which he states understanding to 5. As long as he remains stable will  proceed with MRV in 4-6 months.  Informed patient that our schedulers will call him with MRV appointment date/time.  All questions answered and concerns addressed. Patient conveys understanding and agrees with plan.  Thank you for this interesting consult.  I greatly enjoyed meeting Jauron Freerksen. and look forward to participating in their care.  A copy of this report was sent to the requesting provider on this date.  Electronically Signed: Joaquim Nam, PA-C 12/14/2019, 9:15 AM   I spent a total of  40 Minutes in face to face in clinical consultation, greater than 50% of which was counseling/coordinating care for tinnitus/imaging review follow up.

## 2019-12-15 LAB — CUP PACEART REMOTE DEVICE CHECK
Date Time Interrogation Session: 20210415223654
Implantable Pulse Generator Implant Date: 20210111

## 2019-12-18 DIAGNOSIS — N529 Male erectile dysfunction, unspecified: Secondary | ICD-10-CM | POA: Diagnosis not present

## 2019-12-18 DIAGNOSIS — M79642 Pain in left hand: Secondary | ICD-10-CM | POA: Diagnosis not present

## 2019-12-18 DIAGNOSIS — G4700 Insomnia, unspecified: Secondary | ICD-10-CM | POA: Diagnosis not present

## 2019-12-18 DIAGNOSIS — Z6824 Body mass index (BMI) 24.0-24.9, adult: Secondary | ICD-10-CM | POA: Diagnosis not present

## 2019-12-20 ENCOUNTER — Other Ambulatory Visit: Payer: Self-pay

## 2019-12-20 NOTE — Patient Outreach (Signed)
Second telephone outreach attempt to obtain mRS. No answer. Left message for returned call.  [facility] Management Assistant [phone] 

## 2019-12-25 ENCOUNTER — Other Ambulatory Visit: Payer: Self-pay

## 2019-12-25 DIAGNOSIS — N401 Enlarged prostate with lower urinary tract symptoms: Secondary | ICD-10-CM | POA: Diagnosis not present

## 2019-12-25 DIAGNOSIS — E7800 Pure hypercholesterolemia, unspecified: Secondary | ICD-10-CM | POA: Diagnosis not present

## 2019-12-25 DIAGNOSIS — F329 Major depressive disorder, single episode, unspecified: Secondary | ICD-10-CM | POA: Diagnosis not present

## 2019-12-25 DIAGNOSIS — I639 Cerebral infarction, unspecified: Secondary | ICD-10-CM | POA: Diagnosis not present

## 2019-12-25 NOTE — Patient Outreach (Signed)
3 outreach attempts were completed to obtain mRs. mRs could not be obtained because patient never returned my calls. mRs=7    [facility] Management Assistant [phone]

## 2020-01-10 ENCOUNTER — Telehealth: Payer: Self-pay

## 2020-01-10 NOTE — Telephone Encounter (Signed)
The pt states he felt dizzy for a couple of days. He would like for the nurse to review his transmission and give him a call back.

## 2020-01-10 NOTE — Telephone Encounter (Signed)
Returned call to patient. He reports some dizziness yesterday. Denies syncope. He does not have a way to check BP at home. Reports similar episodes in the past, though none in the past few years. Advised that ILR nightly transmission shows no tachy or AF episodes (pause and brady detection off). Encouraged pt to contact PCP and to discuss with Frann Rider, NP, at upcoming stroke f/u on 01/15/20. Pt reports that he has a blockage that Dr. Patrecia Pour has been following, so he will plan to touch base with Dr. Kirke Corin office about his symptoms. Pt appreciative of call back and denies additional questions or concerns at this time.  Discussed briefly with Dr. Rayann Heman. Received order to enable pause and brady detection on LINQ. Reprogramming sent remotely via [facility]. Will continue to monitor remotely via [facility].

## 2020-01-15 ENCOUNTER — Other Ambulatory Visit: Payer: Self-pay

## 2020-01-15 ENCOUNTER — Encounter: Payer: Self-pay | Admitting: Adult Health

## 2020-01-15 ENCOUNTER — Ambulatory Visit: Payer: Medicare HMO | Admitting: Adult Health

## 2020-01-15 ENCOUNTER — Ambulatory Visit (INDEPENDENT_AMBULATORY_CARE_PROVIDER_SITE_OTHER): Payer: Medicare HMO | Admitting: *Deleted

## 2020-01-15 VITALS — BP 128/85 | HR 86 | Temp 97.2°F | Ht 68.0 in | Wt 160.0 lb

## 2020-01-15 DIAGNOSIS — E785 Hyperlipidemia, unspecified: Secondary | ICD-10-CM

## 2020-01-15 DIAGNOSIS — G08 Intracranial and intraspinal phlebitis and thrombophlebitis: Secondary | ICD-10-CM | POA: Diagnosis not present

## 2020-01-15 DIAGNOSIS — G4700 Insomnia, unspecified: Secondary | ICD-10-CM

## 2020-01-15 DIAGNOSIS — I639 Cerebral infarction, unspecified: Secondary | ICD-10-CM

## 2020-01-15 LAB — CUP PACEART REMOTE DEVICE CHECK
Date Time Interrogation Session: 20210516223307
Implantable Pulse Generator Implant Date: 20210111

## 2020-01-15 NOTE — Patient Instructions (Addendum)
Continue aspirin 81 mg daily  and atorvastatin for secondary stroke prevention  Continue to follow up with PCP/cardiology regarding cholesterol and blood pressure management   Continue to follow with Dr. Estanislado Pandy with repeat imaging around July or August -you will be called to schedule imaging  Loop recorder will continue to be monitored for possible atrial fibrillation  Referral placed to [facility] sleep clinic for evaluation of possible sleep apnea and ongoing insomnia   Recommend trialing increase of melatonin to 10mg  nightly - if difficuly tolerating, can to take 7.5mg  nightly. Take at sunset nightly.   Continue to monitor blood pressure at home  Maintain strict control of hypertension with blood pressure goal below 130/90, diabetes with hemoglobin A1c goal below 6.5% and cholesterol with LDL cholesterol (bad cholesterol) goal below 70 mg/dL. I also advised the patient to eat a healthy diet with plenty of whole grains, cereals, fruits and vegetables, exercise regularly and maintain ideal body weight.  Followup in the future with me in 6 months or call earlier if needed       Thank you for coming to see [place] at [facility]. I hope we have been able to provide you high quality care today.  You may receive a patient satisfaction survey over the next few weeks. We would appreciate your feedback and comments so that we may continue to improve ourselves and the health of our patients.

## 2020-01-15 NOTE — Progress Notes (Signed)
[facility] [address] [place]. [place] [address] [phone]       STROKE FOLLOW UP NOTE  [name]. Date of Birth:  May 22, 1951 Medical Record Number:  [id number]   Reason for Referral: stroke follow up    CHIEF COMPLAINT:  Chief Complaint  Patient presents with  . Follow-up    stroke fu, treatment rm, alone, pt states he has improved     HPI: Today, 01/15/2020, Bryce Logan returns for follow-up regarding cryptogenic left MCA stroke in 08/2018.  Residual deficits of speech impairment and mild decrease right hand dexterity.  Endorses ongoing improvement.  Chronic history of vertigo previously diagnosed with BPPV by ENT without recurrent debilitating episode in over 2 to 3 years.  He does report recent vertigo episode lasting only short duration and only mild intensity where he was still able to maintain activities.  Symptoms resolved the following day.  History of chronic migraines with aura approximately 2-3 times per year.  Continues on aspirin 81 mg daily and atorvastatin 10 mg daily for secondary stroke prevention.  Cardiology recently switch statins from Crestor to atorvastatin due to darkened urine and insomnia and concern for possible medication side effect. Darkened urine resolved since change.  Blood pressure today 128/85.  Loop recorder has not shown atrial fibrillation thus far.  History of insomnia previously undergoing sleep study "over 25 years ago" and was negative for sleep apnea.  He is questioning possible need of repeat study.  Currently on Ambien and Xanax as needed as well as melatonin 5 mg nightly.  Recent follow-up with [facility] after completion of MRV with findings likely consistent with chronic proximal right transverse sinus thrombosis recommended continue medical management as risks of proceeding with thrombectomy outweighs potential benefit.  Also discussed new onset intermittent headaches and whooshing sound in left ear occurring on 12/04/2019  and 12/05/2019. He has not experienced whoosing epsiode since that time. He was advised to contact NIR if symptoms reoccur.  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     History provided for reference purposes only Initial visit 10/16/2019 JM: [name] is a 69 year old male who is being seen today for hospital follow up. Residual deficits of verbal apraxia but does endorse ongoing improvement and continues to participate in speech therapy.  He denies cognitive or memory concerns.  He questions possible return to driving.  Completed 3 weeks DAPT and continues on aspirin alone without bleeding or bruising.  Continues on Crestor 5 mg daily as advised by cardiology without myalgias.  Had difficulty tolerating 10 mg dosage due to increased agitation which she has experienced with use of prior statins.  Blood pressure today 118/68.  Loop recorder is not shown atrial fibrillation thus far.  He does endorse chronic history of episodic vertigo but has not experienced in the past 2 to 3 years.  Vertigo sensation can last up to 8 hours and severely limits daily functioning.  He does endorse worsening sensation with quick head movements.  He states he has previously been evaluated for the symptoms without prior diagnosis.  He does report chronic bilateral consistent tinnitus and mild hearing loss.  He also endorses history of migraines but do not present with vertigo episodes.  No further concerns at this time.  Stroke admission 09/06/2018: BryceDiontae R Shippee Logan.is a 69 y.o.malewith history of hyperlipidemia and enlarged thoracic aortic aneurysm  presented on 09/07/2019 with right-sided facial droop, aphasia, right-sided weakness.  Evaluated by stroke team and Dr. Erlinda Hong with stroke work-up revealing left MCA territory infarct as evidenced on MRI s/p TPA and IR with TICI 3 revascularization of left M2 with stroke etiology  unknown.  2D echo normal EF without cardiac source of embolus.  Lower extremity venous Doppler negative.  TEE negative for PFO or cardiac source of embolus.  Loop recorder placed for monitoring of atrial fibrillation as possible etiology.  Recommended DAPT for 3 weeks and aspirin alone.  LDL 88 and recommended continuation of pravastatin 20 mg daily.  No prior history of HTN or DM with A1c 5.4.  Other stroke risk factors include advanced age, EtOH use, history of substance abuse, questionable prior history of stroke and CAD.  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  Residual deficits of moderate expressive aphasia, mild dysarthria and mild right lower facial weakness and discharged home with recommendation of outpatient SLP.   Stroke: left MCA territory infarct s/p tPA and IR w/ TICI3 revascularization L M2, embolic secondary to unknown source  Code Stroke CT head No acute abnormality. ASPECTS 10.   CTA head &neck ELVO L MCA M2 branch just beyond bifurcation.Mild atherosclerosis  CT perfusion 24 mL core and 71 mL penumbra anterior L MCA territory   Cerebral angio occluded superior division L MCA M2 w/ TICI3 revascularization  MRI- 09/08/19 - Scattered small acute left cerebral infarcts greatest in thefrontal operculum.Abnormal appearance of the right transverse sinus most suggestiveof occlusion (versus very slow flow due to a distal stenosis).   2D Echo / bubble - EF 60 - 65%. No cardiac source of emboli identified.  LE venous doppler - no DVT  TEE neg for PFO or SOE. Did find a dilated sinus of valsalva. OP f/u with cardiology  Loop recorder placed  LDL88  HgbA1c5.4  UDS- negative      ROS:   14 system review of systems performed and negative with exception of verbal apraxia, decreased hand dexterity, insomnia  PMH:  Past Medical  History:  Diagnosis Date  . Chronic prostatitis    ELEVATED PSA . ED DR. DAHLSTEDT  . Coronary artery disease   . Depression   . Dizziness 11/11   DR. ROSEN, NORMAL EF, MILD LEAKINESS OF MITRAL VALVE, MILD STIFFNESS OF HEART MUSCLE, MILD ENLARGEMENT OF AORTIC ROOT, REPEAT IN 1 YEAR DR. Radford Pax, NL ETT DR. Radford Pax 12/11  . H/O: GI bleed 2002   DR. HAYES   . Hyperlipidemia   . Insomnia    ON XANAX, DR. Sabra Heck  . Intracerebral hematoma ([place]) 2013   [place]  . Mild bipolar disorder ([place]) 01/2010   DR. PITTMAN DR. Toy Care, CHR DEPRESSION, FATIGUE OFF MEDS FOR AWHILE, CONTROLLED  . Tubular adenoma 09/17/11   [facility]    PSH:  Past Surgical History:  Procedure Laterality Date  . BUBBLE STUDY  09/12/2019   Procedure: BUBBLE STUDY;  Surgeon: [name], MD;  Location: [facility];  Service: Cardiovascular;;  . COLONOSCOPY  2002  . IR CT HEAD LTD  09/07/2019  . IR PERCUTANEOUS ART THROMBECTOMY/INFUSION INTRACRANIAL INC DIAG ANGIO  09/07/2019  . LEFT KNEE ATHROSCOPY  2002  . LEFT LEG AMBULATORY PHLEBECTOMY  2002  . LOOP RECORDER INSERTION N/A 09/12/2019   Procedure: LOOP RECORDER INSERTION;  Surgeon: Thompson Grayer, MD;  Location[facility]  INVASIVE CV LAB;  Service: Cardiovascular;  Laterality: N/A;  . RADIOLOGY WITH ANESTHESIA N/A 09/07/2019   Procedure: RADIOLOGY WITH ANESTHESIA;  Surgeon: Luanne Bras, MD;  Location: [facility];  Service: Radiology;  Laterality: N/A;  . TEE WITHOUT CARDIOVERSION N/A 09/12/2019   Procedure: TRANSESOPHAGEAL ECHOCARDIOGRAM (TEE);  Surgeon: [name], MD;  Location: [facility] ENDOSCOPY;  Service: Cardiovascular;  Laterality: N/A;    Social History:  Social History   Socioeconomic History  . Marital status: Divorced    Spouse name: Not on file  . Number of children: Not on file  . Years of education: Not on file  . Highest education level: Not on file  Occupational History  . Not on file  Tobacco Use  . Smoking status: Never  Smoker  . Smokeless tobacco: Never Used  Substance and Sexual Activity  . Alcohol use: Yes    Alcohol/week: 2.0 - 6.0 standard drinks    Types: 2 - 6 Glasses of wine per week    Comment: 2-6 PER WEEK  . Drug use: Not Currently    Types: Marijuana  . Sexual activity: Not Currently  Other Topics Concern  . Not on file  Social History Narrative  . Not on file   Social Determinants of Health   Financial Resource Strain:   . Difficulty of Paying Living Expenses:   Food Insecurity:   . Worried About Charity fundraiser in the Last Year:   . Arboriculturist in the Last Year:   Transportation Needs:   . Film/video editor (Medical):   Marland Kitchen Lack of Transportation (Non-Medical):   Physical Activity:   . Days of Exercise per Week:   . Minutes of Exercise per Session:   Stress:   . Feeling of Stress :   Social Connections:   . Frequency of Communication with Friends and Family:   . Frequency of Social Gatherings with Friends and Family:   . Attends Religious Services:   . Active Member of Clubs or Organizations:   . Attends Archivist Meetings:   Marland Kitchen Marital Status:   Intimate Partner Violence:   . Fear of Current or Ex-Partner:   . Emotionally Abused:   Marland Kitchen Physically Abused:   . Sexually Abused:     Family History:  Family History  Problem Relation Age of Onset  . Cancer - Other Mother   . Hypertension Father   . Heart disease Father   . Melanoma Sister   . Alcoholism Son   . Alcohol abuse Son     Medications:   Current Outpatient Medications on File Prior to Visit  Medication Sig Dispense Refill  . ALPRAZolam (XANAX) 0.5 MG tablet Take 1 mg by mouth at bedtime.   1  . Ascorbic Acid (VITAMIN C) 1000 MG tablet Take 1,000 mg by mouth daily.    Marland Kitchen aspirin (ASPIR-LOW) 81 MG EC tablet Take 81 mg by mouth daily. Swallow whole.    Marland Kitchen atorvastatin (LIPITOR) 10 MG tablet Take 1 tablet (10 mg total) by mouth daily. 90 tablet 3  . Cholecalciferol (VITAMIN D3) 1.25 MG  (50000 UT) CAPS Take by mouth as needed.     . Coenzyme Q10 (CO Q 10) 100 MG CAPS Take 1 capsule by mouth daily.    . Magnesium 300 MG CAPS Take 300 mg by mouth daily.     . Melatonin 5 MG CAPS Take 10 mg by mouth at bedtime.    . NON FORMULARY LITHIUM OROTATE    .  NON FORMULARY Take 1 tablet by mouth daily. Sam-e 400mg  daily    . sildenafil (VIAGRA) 25 MG tablet Take 25 mg by mouth daily as needed for erectile dysfunction.    Marland Kitchen UNABLE TO FIND Med Name: ashwao Supplement    . zolpidem (AMBIEN) 10 MG tablet Take 5 mg by mouth at bedtime as needed for sleep. Reports breaking tablet into 1/3 tablet     No current facility-administered medications on file prior to visit.    Allergies:   Allergies  Allergen Reactions  . Sulfa Antibiotics     SEVERE FATIGUE AND SIDE EFFECTS     Physical Exam  Vitals:   01/15/20 1419  BP: 128/85  Pulse: 86  Temp: (!) 97.2 F (36.2 C)  Weight: 160 lb (72.6 kg)  Height: 5\' 8"  (1.727 m)   Body mass index is 24.33 kg/m. No exam data present  General: well developed, well nourished,  pleasant middle-age Caucasian male, seated, in no evident distress Head: head normocephalic and atraumatic.   Neck: supple with no carotid or supraclavicular bruits Cardiovascular: regular rate and rhythm, no murmurs Musculoskeletal: no deformity Skin:  no rash/petichiae Vascular:  Normal pulses all extremities   Neurologic Exam Mental Status: Awake and fully alert. Verbal apraxia with word hesitancy - improvement from prior visit.  Oriented to place and time. Recent and remote memory intact. Attention span, concentration and fund of knowledge appropriate. Mood and affect appropriate.  Cranial Nerves: Pupils equal, briskly reactive to light. Extraocular movements full without nystagmus. Visual fields full to confrontation. Hearing intact. Facial sensation intact. Face, tongue, palate moves normally and symmetrically.  Motor: Normal bulk and tone. Normal strength in all  tested extremity muscles except slightly decreased right hand dexterity. Sensory.: intact to touch , pinprick , position and vibratory sensation.  Coordination: Rapid alternating movements normal in all extremities except slightly decreased right hand dexterity. Finger-to-nose and heel-to-shin performed accurately bilaterally. Gait and Station: Arises from chair without difficulty. Stance is normal. Gait demonstrates normal stride length and balance Reflexes: 1+ and symmetric. Toes downgoing.       ASSESSMENT: Akul Presser. is a 69 y.o. year old male presented with right-sided facial droop, aphasia and right-sided weakness on 09/07/2019 with a stroke work-up revealing left MCA territory infarct s/p TPA and IR with TICI 3 revascularization of left M2 secondary to unknown source with loop recorder placed for possible atrial fibrillation as etiology.  MRI showed right TS stenosis/occlusion.  Vascular risk factors include HLD, EtOH use, history of substance abuse, questionable history of stroke and CAD.  Recent follow up with NIR for TS stenosis/occlusion follow-up and new onset of ear whooshing sensation with MRV 4/9 consistent with chronic proximal right transverse sinus thrombosis and recommended conservative management with repeat imaging in 3 to 4 months as no recurrent episodes of headaches or ear sensation.  Residual stroke deficits of dysarthria and mild decreased right hand coordination.  Recent episode of vertigo/dizziness but denies debilitating features as prior vertigo episodes 2 to 3 years prior or debilitating and would last for days.    PLAN:  1. Cryptogenic left MCA stroke:  2. Right TS thrombus: -Continue aspirin 81 mg daily  and pravastatin for secondary stroke prevention.   -Loop recorder will continue to be monitored for possible atrial fibrillation.   -Continue to follow with NIR with plans on repeating MRV around July or August.  Advised to call NIR with any recurrent or new  symptoms as currently being managed conservatively -  Maintain strict control of hypertension with blood pressure goal below 130/90, diabetes with hemoglobin A1c goal below 6.5% and cholesterol with LDL cholesterol (bad cholesterol) goal below 70 mg/dL.  I also advised the patient to eat a healthy diet with plenty of whole grains, cereals, fruits and vegetables, exercise regularly with at least 30 minutes of continuous activity daily and maintain ideal body weight. 3. HLD: Continuation of atorvastatin and ongoing follow-up with PCP/cardiology for monitoring and management 4. Chronic BPPV: No reoccurring debilitating episodes over the past 2 to 3 years but did have mild form last week with similar characteristics but less severe and shorter duration.  He was advised to reach out to [facility] with any reoccurring episodes or symptoms.  5. Insomnia: referral placed to [facility] for evaluation of possible underlying sleep apnea.  Also recommended trialing increase of melatonin to 10 mg nightly and to ensure adequate sleep hygiene with increasing daily activity   Follow up in 6 months or call earlier if needed   I spent 35 minutes of face-to-face and non-face-to-face time with patient.  This included previsit chart review, lab review, study review, order entry, electronic health record documentation, patient education regarding cryptogenic stroke, importance of managing stroke risk factors, history of BPPV, insomnia and answered all questions to patient satisfaction    Frann Rider, [facility]  [facility] [address] [place], [place] [address]  Phone [phone] Fax [phone] Note: This document was prepared with digital dictation and possible smart phrase technology. Any transcriptional errors that result from this process are unintentional.

## 2020-01-16 NOTE — Progress Notes (Signed)
Carelink Summary Report / Loop Recorder 

## 2020-01-19 ENCOUNTER — Telehealth: Payer: Self-pay | Admitting: Cardiovascular Disease

## 2020-01-19 NOTE — Telephone Encounter (Signed)
Pt c/o medication issue:  1. Name of Medication:  Atorvastatin 2. How are you currently taking this medication (dosage and times per day)? 1 time a day   3. Are you having a reaction (difficulty breathing--STAT)? No  4. What is your medication issue? feel spaced out all the time,- can not keep attention on things long, have to conc time,muscle weakness- this have been going on for about 2 weeks, it seems to be getting worse.Marland Kitchen

## 2020-01-19 NOTE — Progress Notes (Signed)
I agree with the above plan 

## 2020-01-19 NOTE — Telephone Encounter (Signed)
Pt reports that he has a "fuzzy" feeling in his head and body aches and he thinks it all started when he started the Atorvastatin... he stopped taking it last night.   I advised to stay off of it for now and to call his PCP if his symptoms persist or worsen. I will forward to Dr. Audie Box for review.   He notes that he has had a lot of trouble with STATINS in the past.

## 2020-01-19 NOTE — Telephone Encounter (Signed)
Called patient back to discuss med changes- patient did not answer.  LVM to call back.

## 2020-01-19 NOTE — Telephone Encounter (Signed)
Stop lipitor. Go back to pravastatin 20 mg QD. He tolerated that one. Confirm he will want to do this.   [place] T. Audie Box, [place]  [address], [place], [place] [address] [phone]  1:27 PM

## 2020-01-23 MED ORDER — PRAVASTATIN SODIUM 20 MG PO TABS: 10.0000 mg | ORAL_TABLET | Freq: Every evening | ORAL | 3 refills | Status: DC

## 2020-01-23 NOTE — Telephone Encounter (Signed)
Follow Up:    Pt is returning Cheryl's call.

## 2020-01-23 NOTE — Telephone Encounter (Signed)
Returned call to patient no answer.LMTC. 

## 2020-01-23 NOTE — Telephone Encounter (Signed)
Spoke with pt, he reports the pravastatin made him irritable. He is willing to retry at 10 mg once daily. He will use up the supply he currently has and call when refills are needed.

## 2020-01-23 NOTE — Telephone Encounter (Signed)
Patient returning call.

## 2020-01-26 DIAGNOSIS — E7800 Pure hypercholesterolemia, unspecified: Secondary | ICD-10-CM | POA: Diagnosis not present

## 2020-01-26 DIAGNOSIS — I639 Cerebral infarction, unspecified: Secondary | ICD-10-CM | POA: Diagnosis not present

## 2020-01-26 DIAGNOSIS — N401 Enlarged prostate with lower urinary tract symptoms: Secondary | ICD-10-CM | POA: Diagnosis not present

## 2020-01-26 DIAGNOSIS — F329 Major depressive disorder, single episode, unspecified: Secondary | ICD-10-CM | POA: Diagnosis not present

## 2020-02-01 ENCOUNTER — Encounter: Payer: Self-pay | Admitting: Neurology

## 2020-02-01 ENCOUNTER — Other Ambulatory Visit: Payer: Self-pay

## 2020-02-01 ENCOUNTER — Ambulatory Visit: Payer: Medicare HMO | Admitting: Neurology

## 2020-02-01 VITALS — BP 123/81 | HR 84 | Ht 68.0 in | Wt 161.5 lb

## 2020-02-01 DIAGNOSIS — G479 Sleep disorder, unspecified: Secondary | ICD-10-CM

## 2020-02-01 DIAGNOSIS — Z8673 Personal history of transient ischemic attack (TIA), and cerebral infarction without residual deficits: Secondary | ICD-10-CM

## 2020-02-01 DIAGNOSIS — G4700 Insomnia, unspecified: Secondary | ICD-10-CM

## 2020-02-01 NOTE — Patient Instructions (Signed)
  Based on your symptoms and your exam I believe you are at risk for obstructive sleep apnea (aka OSA), and I think we should proceed with a sleep study to determine whether you do or do not have OSA and how severe it is. Even, if you have mild OSA, I may want you to consider treatment with CPAP, as treatment of even borderline or mild sleep apnea can result and improvement of symptoms such as sleep disruption, daytime sleepiness, nighttime bathroom breaks, restless leg symptoms, improvement of headache syndromes, even improved mood disorder.   Please remember, the long-term risks and ramifications of untreated moderate to severe obstructive sleep apnea are: increased Cardiovascular disease, including congestive heart failure, stroke, difficult to control hypertension, treatment resistant obesity, arrhythmias, especially irregular heartbeat commonly known as A. Fib. (atrial fibrillation); even type 2 diabetes has been linked to untreated OSA.   Sleep apnea can cause disruption of sleep and sleep deprivation in most cases, which, in turn, can cause recurrent headaches, problems with memory, mood, concentration, focus, and vigilance. Most people with untreated sleep apnea report excessive daytime sleepiness, which can affect their ability to drive. Please do not drive if you feel sleepy. Patients with sleep apnea developed difficulty initiating and maintaining sleep (aka insomnia).   Having sleep apnea may increase your risk for other sleep disorders, including involuntary behaviors sleep such as sleep terrors, sleep talking, sleepwalking.    Having sleep apnea can also increase your risk for restless leg syndrome and leg movements at night.   Please note that untreated obstructive sleep apnea may carry additional perioperative morbidity. Patients with significant obstructive sleep apnea (typically, in the moderate to severe degree) should receive, if possible, perioperative PAP (positive airway pressure)  therapy and the surgeons and particularly the anesthesiologists should be informed of the diagnosis and the severity of the sleep disordered breathing.   I will likely see you back after your sleep study to go over the test results and where to go from there. We will call you after your sleep study to advise about the results (most likely, you will hear from Kristen, my nurse) and to set up an appointment at the time, as necessary.    Our sleep lab administrative assistant will call you to schedule your sleep study and give you further instructions, regarding the check in process for the sleep study, arrival time, what to bring, when you can expect to leave after the study, etc., and to answer any other logistical questions you may have. If you don't hear back from her by about 2 weeks from now, please feel free to call her direct line at [phone] or you can call our general clinic number, or email us through My Chart.   

## 2020-02-01 NOTE — Progress Notes (Signed)
Subjective:    Patient ID: Bryce Logan. is a 69 y.o. male.  HPI     Bryce Age, MD, PhD [facility] [address] [place], [place] [address]  Dear Bryce Logan,  I saw your patient, Bryce Logan, upon your kind request in my sleep clinic today for initial consultation of his sleep disorder, concern for underlying obstructive sleep apnea.  The patient is unaccompanied today.  As you know, Bryce Logan is a 69 year old right-handed gentleman with an underlying medical history of depression, insomnia, hyperlipidemia, coronary artery disease, history of prostatitis, and left MCA stroke in January 2021, who reports a longstanding history of difficulty initiating and maintaining sleep.  He had a sleep study several years ago which was negative for sleep apnea.  Testing was over 25 years ago by his report.  I reviewed your office note from 01/15/2020.  His Epworth sleepiness score is 4/24, fatigue severity score is 26/63.  He attempted a sleep study another time but did not sleep or did not sleep enough.  He reports having tried multiple different medications for sleep.  He is currently using Ambien 10 mg strength and breaks the pill in half or in thirds.  He also takes melatonin at night and Xanax at night.  He reports a family history of insomnia affecting his mother.  He has previously seen a psychiatrist for his mood disorder, used to see Bryce Logan.  He has occasional nocturia, denies any night to night issues with that.  He has not had any recurrent morning headaches.  He has tried different antidepressants in the past but is currently not on any antidepressants, reports that they do not work for him.  He reports a bedtime between 1 and 2 AM and he sleeps with significant interruptions, rise time currently around noon.  They do not have any pets in the house.  He lives alone, he has a girlfriend, he does not have a TV in the bedroom.  He is a non-smoker and drinks  alcohol occasionally, caffeine and limitation, 1 cup/day on average. His Past Medical History Is Significant For: Past Medical History:  Diagnosis Date  . Chronic prostatitis    ELEVATED PSA . ED Bryce Logan  . Coronary artery disease   . Depression   . Dizziness 11/11   Bryce Logan, NORMAL EF, MILD LEAKINESS OF MITRAL VALVE, MILD STIFFNESS OF HEART MUSCLE, MILD ENLARGEMENT OF AORTIC ROOT, REPEAT IN 1 YEAR DR. Radford Logan, [place] ETT DR. Radford Logan 12/11  . H/O: GI bleed 2002   DR. HAYES   . Hyperlipidemia   . Insomnia    ON XANAX, Bryce Logan  . Intracerebral hematoma ([place]) 2013   [place]  . Mild bipolar disorder ([place]) 01/2010   Bryce Logan Bryce Logan DEPRESSION, FATIGUE OFF MEDS FOR AWHILE, CONTROLLED  . Tubular adenoma 09/17/11   [place]    His Past Surgical History Is Significant For: Past Surgical History:  Procedure Laterality Date  . BUBBLE STUDY  09/12/2019   Procedure: BUBBLE STUDY;  Surgeon: Bryce Casino, MD;  Location: [facility];  Service: Cardiovascular;;  . COLONOSCOPY  2002  . IR CT HEAD LTD  09/07/2019  . IR PERCUTANEOUS ART THROMBECTOMY/INFUSION INTRACRANIAL INC DIAG ANGIO  09/07/2019  . LEFT KNEE ATHROSCOPY  2002  . LEFT LEG AMBULATORY PHLEBECTOMY  2002  . LOOP RECORDER INSERTION N/A 09/12/2019   Procedure: LOOP RECORDER INSERTION;  Surgeon: Bryce Grayer, MD;  Location:  [facility] [facility] INVASIVE CV LAB;  Service: Cardiovascular;  Laterality: N/A;  . RADIOLOGY WITH ANESTHESIA N/A 09/07/2019   Procedure: RADIOLOGY WITH ANESTHESIA;  Surgeon: Bryce Bras, MD;  Location: [place] [facility];  Service: Radiology;  Laterality: N/A;  . TEE WITHOUT CARDIOVERSION N/A 09/12/2019   Procedure: TRANSESOPHAGEAL ECHOCARDIOGRAM (TEE);  Surgeon: Bryce Casino, MD;  Location: [facility] ENDOSCOPY;  Service: Cardiovascular;  Laterality: N/A;    His Family History Is Significant For: Family History  Problem Relation Logan of Onset  . Cancer - Other Mother   . Hypertension Father   .  Heart disease Father   . Melanoma Sister   . Alcoholism Son   . Alcohol abuse Son     His Social History Is Significant For: Social History   Socioeconomic History  . Marital status: Divorced    Spouse name: Not on file  . Number of children: Not on file  . Years of education: Not on file  . Highest education level: Not on file  Occupational History  . Not on file  Tobacco Use  . Smoking status: Never Smoker  . Smokeless tobacco: Never Used  Substance and Sexual Activity  . Alcohol use: Yes    Alcohol/week: 2.0 - 6.0 standard drinks    Types: 2 - 6 Glasses of wine per week    Comment: 2-6 PER WEEK  . Drug use: Not Currently    Types: Marijuana  . Sexual activity: Not Currently  Other Topics Concern  . Not on file  Social History Narrative  . Not on file   Social Determinants of Health   Financial Resource Strain:   . Difficulty of Paying Living Expenses:   Food Insecurity:   . Worried About Charity fundraiser in the Last Year:   . Arboriculturist in the Last Year:   Transportation Needs:   . Film/video editor (Medical):   Marland Kitchen Lack of Transportation (Non-Medical):   Physical Activity:   . Days of Exercise per Week:   . Minutes of Exercise per Session:   Stress:   . Feeling of Stress :   Social Connections:   . Frequency of Communication with Friends and Family:   . Frequency of Social Gatherings with Friends and Family:   . Attends Religious Services:   . Active Member of Clubs or Organizations:   . Attends Archivist Meetings:   Marland Kitchen Marital Status:     His Allergies Are:  Allergies  Allergen Reactions  . Sulfa Antibiotics     SEVERE FATIGUE AND SIDE EFFECTS  :   His Current Medications Are:  Outpatient Encounter Medications as of 02/01/2020  Medication Sig  . Acetylcysteine (NAC PO) Take 1,000-2,000 mg by mouth.  . ALPRAZolam (XANAX) 0.5 MG tablet Take 1 mg by mouth at bedtime.   . Ascorbic Acid (VITAMIN C) 1000 MG tablet Take 1,000 mg by  mouth daily.  Marland Kitchen aspirin (ASPIR-LOW) 81 MG EC tablet Take 81 mg by mouth daily. Swallow whole.  . Cholecalciferol (VITAMIN D3) 1.25 MG (50000 UT) CAPS Take by mouth as needed.   . Coenzyme Q10 (CO Q 10) 100 MG CAPS Take 1 capsule by mouth daily.  . Magnesium 300 MG CAPS Take 300 mg by mouth daily.   . Melatonin 5 MG CAPS Take 10 mg by mouth at bedtime.  . NON FORMULARY LITHIUM OROTATE  . NON FORMULARY Take 1 tablet by mouth daily. Sam-e 400mg  daily  . pravastatin (PRAVACHOL) 20 MG tablet Take  0.5 tablets (10 mg total) by mouth every evening.  . sildenafil (VIAGRA) 25 MG tablet Take 25 mg by mouth daily as needed for erectile dysfunction.  Marland Kitchen UNABLE TO FIND Med Name: ashwao Supplement  . UNABLE TO FIND Take 650 mg by mouth. Med Name: Ashwagnandha  . UNABLE TO FIND Take 8 mg by mouth. Med Name: Astaxanthin  . zolpidem (AMBIEN) 10 MG tablet Take 5 mg by mouth at bedtime as needed for sleep. Reports breaking tablet into 1/3 tablet   No facility-administered encounter medications on file as of 02/01/2020.  :   Review of Systems:  Out of a complete 14 point review of systems, all are reviewed and negative with the exception of these symptoms as listed below:  Review of Systems  Neurological:       RM 2, alone. Internal referral for sleep consult.    Objective:  Neurological Exam  Physical Exam Physical Examination:   Vitals:   02/01/20 1341  BP: 123/81  Pulse: 84    General Examination: The patient is a very pleasant 69 y.o. male in no acute distress. He appears well-developed and well-nourished and well groomed.   HEENT: Normocephalic, atraumatic, pupils are equal, round and reactive to light, extraocular tracking is good without limitation to gaze excursion or nystagmus noted. Hearing is grossly intact. Face is symmetric with normal facial animation.  Speech is mildly dysarthric.  No hypophonia, no voice tremor, no carotid bruits.  Airway examination reveals mild mouth dryness,  Mallampati class II, mild airway crowding secondary to small airway and redundant soft palate, tonsils are absent. Neck circumference of 15-5/8 inches.  Tongue protrudes centrally and palate elevates symmetrically.    Chest: Clear to auscultation without wheezing, rhonchi or crackles noted.  Heart: S1+S2+0, regular and normal without murmurs, rubs or gallops noted.   Abdomen: Soft, non-tender and non-distended with normal bowel sounds appreciated on auscultation.  Extremities: There is no pitting edema in the distal lower extremities bilaterally.   Skin: Warm and dry without trophic changes noted.   Musculoskeletal: exam reveals no obvious joint deformities, tenderness or joint swelling or erythema.   Neurologically:  Mental status: The patient is awake, alert and oriented in all 4 spheres. His immediate and remote memory, attention, language skills and fund of knowledge are appropriate. There is no evidence of aphasia, agnosia, apraxia or anomia. Speech is clear with normal prosody and enunciation. Thought process is linear. Mood is normal and affect is normal.  Cranial nerves II - XII are as described above under HEENT exam.  Motor exam: Normal bulk, strength and tone is noted. There is no tremor, fine motor skills and coordination: grossly intact.  Cerebellar testing: No dysmetria or intention tremor. There is no truncal or gait ataxia.  Sensory exam: intact to light touch in the upper and lower extremities.  Gait, station and balance: He stands easily. No veering to one side is noted. No leaning to one side is noted. Posture is Logan-appropriate and stance is narrow based. Gait shows normal stride length and normal pace. No problems turning are noted.   Assessment and Plan:    In summary, Umut Kizzire. is a very pleasant 69 y.o.-year old male with an underlying medical history of depression, insomnia, hyperlipidemia, coronary artery disease, history of prostatitis, and left MCA stroke in  January 2021, who presents for evaluation of his sleep disorder.  He has a longstanding history of difficulty initiating and maintaining sleep.  Given his history of stroke  and history of heart disease, underlying sleep disordered breathing should be excluded.  We will proceed with sleep study testing.  I talked to the patient about treatable causes of sleep disruption, primarily obstructive sleep apnea.  He is advised to consider CPAP therapy as our first-line treatment for sleep apnea if he has a qualifying diagnosis.  We talked about sleep study evaluation.  He would be willing to consider coming in for testing, he is advised that he can bring his nighttime medications for the test appointment.  We will reach out to him soon to schedule his overnight polysomnogram.  We talked about alternative treatment options of sleep apnea as well.  I think the quickest and most effective treatment would be in the form of a CPAP or AutoPap machine.  We will pick up our discussion after test results come in.  We will keep him posted by phone call as well.  I answered all his questions today and he was in agreement with the plan.   Thank you very much for allowing me to participate in the Logan of this nice patient. If I can be of any further assistance to you please do not hesitate to talk to me.  Sincerely,   Bryce Age, MD, PhD

## 2020-02-15 DIAGNOSIS — M79642 Pain in left hand: Secondary | ICD-10-CM | POA: Diagnosis not present

## 2020-02-19 ENCOUNTER — Ambulatory Visit (INDEPENDENT_AMBULATORY_CARE_PROVIDER_SITE_OTHER): Payer: Medicare HMO | Admitting: *Deleted

## 2020-02-19 DIAGNOSIS — I639 Cerebral infarction, unspecified: Secondary | ICD-10-CM | POA: Diagnosis not present

## 2020-02-19 LAB — CUP PACEART REMOTE DEVICE CHECK
Date Time Interrogation Session: 20210620230543
Implantable Pulse Generator Implant Date: 20210111

## 2020-02-20 NOTE — Progress Notes (Signed)
Carelink Summary Report / Loop Recorder 

## 2020-03-05 ENCOUNTER — Telehealth: Payer: Self-pay

## 2020-03-05 NOTE — Telephone Encounter (Signed)
LVM for pt to call me back to schedule sleep study  

## 2020-03-11 DIAGNOSIS — I639 Cerebral infarction, unspecified: Secondary | ICD-10-CM | POA: Diagnosis not present

## 2020-03-11 DIAGNOSIS — N401 Enlarged prostate with lower urinary tract symptoms: Secondary | ICD-10-CM | POA: Diagnosis not present

## 2020-03-11 DIAGNOSIS — E7800 Pure hypercholesterolemia, unspecified: Secondary | ICD-10-CM | POA: Diagnosis not present

## 2020-03-11 DIAGNOSIS — F329 Major depressive disorder, single episode, unspecified: Secondary | ICD-10-CM | POA: Diagnosis not present

## 2020-03-12 ENCOUNTER — Telehealth: Payer: Self-pay

## 2020-03-12 NOTE — Telephone Encounter (Signed)
We have attempted to call the patient two times to schedule sleep study.  Patient has been unavailable at the phone numbers we have on file and has not returned our calls. If patient calls back we will schedule them for their sleep study.  

## 2020-03-25 ENCOUNTER — Ambulatory Visit (INDEPENDENT_AMBULATORY_CARE_PROVIDER_SITE_OTHER): Payer: Medicare HMO | Admitting: *Deleted

## 2020-03-25 DIAGNOSIS — I639 Cerebral infarction, unspecified: Secondary | ICD-10-CM

## 2020-03-26 LAB — CUP PACEART REMOTE DEVICE CHECK
Date Time Interrogation Session: 20210725230640
Implantable Pulse Generator Implant Date: 20210111

## 2020-03-27 NOTE — Progress Notes (Signed)
Carelink Summary Report / Loop Recorder 

## 2020-04-26 LAB — CUP PACEART REMOTE DEVICE CHECK
Date Time Interrogation Session: 20210825230258
Implantable Pulse Generator Implant Date: 20210111

## 2020-04-29 ENCOUNTER — Ambulatory Visit (INDEPENDENT_AMBULATORY_CARE_PROVIDER_SITE_OTHER): Payer: Medicare HMO | Admitting: *Deleted

## 2020-04-29 DIAGNOSIS — I639 Cerebral infarction, unspecified: Secondary | ICD-10-CM

## 2020-05-01 NOTE — Progress Notes (Signed)
Carelink Summary Report / Loop Recorder 

## 2020-05-14 DIAGNOSIS — Z20828 Contact with and (suspected) exposure to other viral communicable diseases: Secondary | ICD-10-CM | POA: Diagnosis not present

## 2020-05-21 ENCOUNTER — Telehealth (HOSPITAL_COMMUNITY): Payer: Self-pay

## 2020-05-21 NOTE — Telephone Encounter (Signed)
Called to schedule f/u mrv, no answer, left vm. AW 

## 2020-05-22 ENCOUNTER — Other Ambulatory Visit (HOSPITAL_COMMUNITY): Payer: Self-pay | Admitting: Interventional Radiology

## 2020-05-22 ENCOUNTER — Telehealth (HOSPITAL_COMMUNITY): Payer: Self-pay

## 2020-05-22 DIAGNOSIS — I6602 Occlusion and stenosis of left middle cerebral artery: Secondary | ICD-10-CM

## 2020-05-22 NOTE — Telephone Encounter (Signed)
Returned pt's call, no answer, left vm. AW  

## 2020-05-30 ENCOUNTER — Telehealth (HOSPITAL_COMMUNITY): Payer: Self-pay

## 2020-05-30 NOTE — Telephone Encounter (Signed)
Pt called requesting to speak with a PA about his diagnosis and possible sx. I have sent a message to our PA to give him a call. AW

## 2020-06-01 LAB — CUP PACEART REMOTE DEVICE CHECK
Date Time Interrogation Session: 20210925230459
Implantable Pulse Generator Implant Date: 20210111

## 2020-06-03 ENCOUNTER — Ambulatory Visit (INDEPENDENT_AMBULATORY_CARE_PROVIDER_SITE_OTHER): Payer: Medicare HMO

## 2020-06-03 DIAGNOSIS — I639 Cerebral infarction, unspecified: Secondary | ICD-10-CM

## 2020-06-04 NOTE — Progress Notes (Signed)
Carelink Summary Report / Loop Recorder 

## 2020-06-07 ENCOUNTER — Ambulatory Visit (HOSPITAL_COMMUNITY)
Admission: RE | Admit: 2020-06-07 | Discharge: 2020-06-07 | Disposition: A | Discharge status: Home / Self Care | Payer: Medicare HMO | Source: Ambulatory Visit | Attending: Interventional Radiology | Admitting: Interventional Radiology

## 2020-06-07 ENCOUNTER — Other Ambulatory Visit: Payer: Self-pay

## 2020-06-07 DIAGNOSIS — I676 Nonpyogenic thrombosis of intracranial venous system: Secondary | ICD-10-CM | POA: Diagnosis not present

## 2020-06-07 DIAGNOSIS — I6602 Occlusion and stenosis of left middle cerebral artery: Secondary | ICD-10-CM | POA: Insufficient documentation

## 2020-06-07 DIAGNOSIS — J3489 Other specified disorders of nose and nasal sinuses: Secondary | ICD-10-CM | POA: Diagnosis not present

## 2020-06-07 DIAGNOSIS — I63412 Cerebral infarction due to embolism of left middle cerebral artery: Secondary | ICD-10-CM | POA: Diagnosis not present

## 2020-06-07 MED ORDER — GADOBUTROL 1 MMOL/ML IV SOLN
7.0000 mL | Freq: Once | INTRAVENOUS | Status: AC | PRN
  Administered 2020-06-07: 7 mL via INTRAVENOUS

## 2020-06-10 ENCOUNTER — Telehealth: Payer: Self-pay | Admitting: Student

## 2020-06-10 NOTE — Telephone Encounter (Signed)
NIR.  Received message from patient requesting call back from PA regarding questions about recent MRV head.  Called patient at [phone] to discuss. Patient asks if there is "any flow in my vessel" (specifically asking about right TS). Explained that Dr. Estanislado Pandy reviewed his scans and based on MRV, there is no flow within his right TS- however, this does not mean that there is no flow angiographically (there could be flow seen on dx cerebral arteriogram, however not able to tell unless procedure will occur). Patient also asks how worrisome he should be at this time. Explained that since patient is asymptomatic and his scan is stable, we recommend continued conservative management including routine imaging scans to monitor for changes (recommended 1 year MRV per Dr. Estanislado Pandy) along with symptom monitoring- advised patient to call 911/head to ED if he notices stroke like symptoms. All questions answered and concerns addressed. Patient conveys understanding and agrees with plan.  Please call NIR with questions/concerns.   Bea Graff Hulbert Branscome, PA-C 06/10/2020, 3:36 PM

## 2020-06-16 NOTE — Progress Notes (Signed)
Cardiology Office Note:   Date:  06/17/2020  NAME:  Bryce Logan.    MRN: [id number] DOB:  09/05/1950   PCP:  Kathyrn Lass, MD  Cardiologist:  Evalina Field, MD   Referring MD: Kathyrn Lass, MD   Chief Complaint  Patient presents with   Follow-up   History of Present Illness:   Bryce Logan. is a 69 y.o. male with a hx of CVA with loop, HLD, dilated aortic root who presents for follow-up. Tried to get him on high intensity statin but cannot tolerate crestor or lipitor. On pravastatin.  Tolerating Pravachol well.  Most recent LDL cholesterol at goal.  Had significant issues with Lipitor and Crestor.  He reports he is exercising 30 to 40 minutes several days per week.  He walks at a fast pace.  He denies any chest pain or shortness of breath.  Monitor shows no atrial fibrillation.  It appears has been diagnosed with a transverse sinus thrombosis.  This is followed by interventional radiology.  He has had multiple MRV's.  He does need to have this evaluated by a neurologist.  I recommend he do this.  Unclear if he needs anticoagulation at this point.  He also reports he suffers from primary insomnia.  Has had sleep studies in the past but his issue appears to be insomnia.  He will seek a new neurologist for opinion on this.  From a cardiovascular standpoint his blood pressure is well controlled.  Cholesterol at goal.  He is exercising has had no atrial fibrillation detected.  Problem List 1. Cryptogenic Stroke -L MCA s/p tPA 09/2019 -Negative TEE for PFO/LAA thrombus  -mild R ICA plaque/no stenosis L ICA -LDL 62 -A1c 5.4 -loop recorder in place  2. Dilated SoV -41 mm CT chest wo contrast 2019 -40 mm root TTE 09/08/2019 3. R transverse sinus thrombosis?  Past Medical History: Past Medical History:  Diagnosis Date   Chronic prostatitis    ELEVATED PSA . ED DR. DAHLSTEDT   Coronary artery disease    Depression    Dizziness 11/11   DR. ROSEN, NORMAL EF, MILD LEAKINESS OF MITRAL  VALVE, MILD STIFFNESS OF HEART MUSCLE, MILD ENLARGEMENT OF AORTIC ROOT, REPEAT IN 1 YEAR DR. Radford Pax, NL ETT DR. Radford Pax 12/11   H/O: GI bleed 2002   DR. HAYES    Hyperlipidemia    Insomnia    ON Duanne Moron, DR. MILLER   Intracerebral hematoma ([place]) 2013   [facility]   Mild bipolar disorder ([place]) 01/2010   DR. [place], CHR DEPRESSION, FATIGUE OFF MEDS FOR AWHILE, CONTROLLED   Tubular adenoma 09/17/11   [facility]    Past Surgical History: Past Surgical History:  Procedure Laterality Date   BUBBLE STUDY  09/12/2019   Procedure: BUBBLE STUDY;  Surgeon: [name], MD;  Location: [place];  Service: Cardiovascular;;   COLONOSCOPY  2002   IR CT HEAD LTD  09/07/2019   IR PERCUTANEOUS ART THROMBECTOMY/INFUSION INTRACRANIAL INC DIAG ANGIO  09/07/2019   LEFT KNEE ATHROSCOPY  2002   LEFT LEG AMBULATORY PHLEBECTOMY  2002   LOOP RECORDER INSERTION N/A 09/12/2019   Procedure: LOOP RECORDER INSERTION;  Surgeon: Thompson Grayer, MD;  Location: [place] [facility];  Service: Cardiovascular;  Laterality: N/A;   RADIOLOGY WITH ANESTHESIA N/A 09/07/2019   Procedure: RADIOLOGY WITH ANESTHESIA;  Surgeon: Luanne Bras, MD;  Location: [place];  Service: Radiology;  Laterality: N/A;   TEE WITHOUT CARDIOVERSION N/A  09/12/2019   Procedure: TRANSESOPHAGEAL ECHOCARDIOGRAM (TEE);  Surgeon: [name], MD;  Location[facility];  Service: Cardiovascular;  Laterality: N/A;    Current Medications: Current Meds  Medication Sig   Acetylcysteine (NAC PO) Take 1,000-2,000 mg by mouth.   ALPRAZolam (XANAX) 0.5 MG tablet Take 1 mg by mouth at bedtime.    Ascorbic Acid (VITAMIN C) 1000 MG tablet Take 1,000 mg by mouth daily.   aspirin (ASPIR-LOW) 81 MG EC tablet Take 81 mg by mouth daily. Swallow whole.   Cholecalciferol (VITAMIN D3) 1.25 MG (50000 UT) CAPS Take by mouth as needed.    Coenzyme Q10 (CO Q 10) 100 MG CAPS Take 1 capsule by mouth daily.    Magnesium 300 MG CAPS Take 300 mg by mouth daily.    Melatonin 5 MG CAPS Take 10 mg by mouth at bedtime.   NON FORMULARY LITHIUM OROTATE   NON FORMULARY Take 1 tablet by mouth daily. Sam-e 400mg  daily   RESVERATROL PO Take 600 mg by mouth daily.   sildenafil (VIAGRA) 25 MG tablet Take 25 mg by mouth daily as needed for erectile dysfunction.   UNABLE TO FIND Med Name: ashwao Supplement   UNABLE TO FIND Take 650 mg by mouth. Med Name: Ashwagnandha   UNABLE TO FIND Take 8 mg by mouth. Med Name: Astaxanthin     Allergies:    Sulfa antibiotics   Social History: Social History   Socioeconomic History   Marital status: Divorced    Spouse name: Not on file   Number of children: Not on file   Years of education: Not on file   Highest education level: Not on file  Occupational History   Not on file  Tobacco Use   Smoking status: Never Smoker   Smokeless tobacco: Never Used  Vaping Use   Vaping Use: Never assessed  Substance and Sexual Activity   Alcohol use: Yes    Alcohol/week: 2.0 - 6.0 standard drinks    Types: 2 - 6 Glasses of wine per week    Comment: 2-6 PER WEEK   Drug use: Not Currently    Types: Marijuana   Sexual activity: Not Currently  Other Topics Concern   Not on file  Social History Narrative   Not on file   Social Determinants of Health   Financial Resource Strain:    Difficulty of Paying Living Expenses: Not on file  Food Insecurity:    Worried About [place] in the Last Year: Not on file   Ran Out of Food in the Last Year: Not on file  Transportation Needs:    Lack of Transportation (Medical): Not on file   Lack of Transportation (Non-Medical): Not on file  Physical Activity:    Days of Exercise per Week: Not on file   Minutes of Exercise per Session: Not on file  Stress:    Feeling of Stress : Not on file  Social Connections:    Frequency of Communication with Friends and Family: Not on file   Frequency of  Social Gatherings with Friends and Family: Not on file   Attends Religious Services: Not on file   Active Member of Clubs or Organizations: Not on file   Attends Archivist Meetings: Not on file   Marital Status: Not on file     Family History: The patient's family history includes Alcohol abuse in his son; Alcoholism in his son; Cancer - Other in his mother; Heart disease in his  father; Hypertension in his father; Melanoma in his sister.  ROS:   All other ROS reviewed and negative. Pertinent positives noted in the HPI.     EKGs/Labs/Other Studies Reviewed:   The following studies were personally reviewed by me today:  TTE 09/08/2019  1. Left ventricular ejection fraction, by visual estimation, is 60 to  65%. The left ventricle has normal function. There is no left ventricular  hypertrophy. Normal diastolic function  2. Global right ventricle has normal systolic function.The right  ventricular size is normal. No increase in right ventricular wall  thickness.  3. Left atrial size was normal.  4. Right atrial size was normal.  5. The mitral valve is normal in structure. Trivial mitral valve  regurgitation.  6. The tricuspid valve is normal in structure.  7. The aortic valve is tricuspid. Aortic valve regurgitation is not  visualized. No evidence of aortic valve stenosis.  8. The pulmonic valve was not well visualized. Pulmonic valve  regurgitation is not visualized.  9. TR signal is inadequate for assessing pulmonary artery systolic  pressure.  10. The inferior vena cava is dilated in size with >50% respiratory  variability, suggesting right atrial pressure of 8 mmHg.  11. There is mild dilatation of the aortic root measuring 40 mm.  12. Saline contrast bubble study was negative, with no evidence of any  interatrial shunt.  Recent Labs: 09/10/2019: Hemoglobin 14.1; Platelets 160 11/30/2019: ALT 52; BUN 11; Creatinine, Ser 1.07; Potassium 4.4; Sodium 139    Recent Lipid Panel    Component Value Date/Time   CHOL 143 11/30/2019 1455   TRIG 84 11/30/2019 1455   HDL 65 11/30/2019 1455   CHOLHDL 2.2 11/30/2019 1455   CHOLHDL 2.8 09/08/2019 0418   VLDL 13 09/08/2019 0418   LDLCALC 62 11/30/2019 1455    Physical Exam:   VS:  BP 106/72    Pulse 75    Ht 5\' 8"  (1.727 m)    Wt 159 lb (72.1 kg)    SpO2 98%    BMI 24.18 kg/m    Wt Readings from Last 3 Encounters:  06/17/20 159 lb (72.1 kg)  02/01/20 161 lb 8 oz (73.3 kg)  01/15/20 160 lb (72.6 kg)    General: Well nourished, well developed, in no acute distress Heart: Atraumatic, normal size  Eyes: PEERLA, EOMI  Neck: Supple, no JVD Endocrine: No thryomegaly Cardiac: Normal S1, S2; RRR; no murmurs, rubs, or gallops Lungs: Clear to auscultation bilaterally, no wheezing, rhonchi or rales  Abd: Soft, nontender, no hepatomegaly  Ext: No edema, pulses 2+ Musculoskeletal: No deformities, BUE and BLE strength normal and equal Skin: Warm and dry, no rashes   Neuro: Alert and oriented to person, place, time, and situation, CNII-XII grossly intact, no focal deficits  Psych: Normal mood and affect   ASSESSMENT:   Niklas Chretien. is a 69 y.o. male who presents for the following: 1. Cryptogenic stroke ([place]) L MCA s/p tPA and IR   2. Dilated aortic root ([place])   3. Mixed hyperlipidemia     PLAN:   1. Cryptogenic stroke ([place]) L MCA s/p tPA and IR -Normal echo.  He has a loop recorder in place.  TEE was negative.  He will continue with loop recorder for 3 years.   2. Dilated aortic root ([facility]) -Aortic root around 41 mm.  Echo shows 40 mm.  No increase in size over several years.  Tricuspid aortic valve.  No history of aortopathy in the  family.  I suspect this is all normal for his age.  I recommend no further surveillance screening.  3. Mixed hyperlipidemia -Most recent LDL at goal.  Only able to tolerate Pravachol.   Disposition: Return in about 1 year (around 06/17/2021).  Medication  Adjustments/Labs and Tests Ordered: Current medicines are reviewed at length with the patient today.  Concerns regarding medicines are outlined above.  No orders of the defined types were placed in this encounter.  Meds ordered this encounter  Medications   pravastatin (PRAVACHOL) 10 MG tablet    Sig: Take 1 tablet (10 mg total) by mouth every evening for 90 doses.    Dispense:  90 tablet    Refill:  3    Patient Instructions  Medication Instructions:  You prescription for pravastatin was refilled. The refill is for 10-mg tablets, so take a whole tablet once you get it refilled. *If you need a refill on your cardiac medications before your next appointment, please call your pharmacy*   Lab Work: None ordered If you have labs (blood work) drawn today and your tests are completely normal, you will receive your results only by:  [place] (if you have MyChart) OR  A paper copy in the mail If you have any lab test that is abnormal or we need to change your treatment, we will call you to review the results.   Testing/Procedures: None ordered   Follow-Up: At [facility], you and your health needs are our priority.  As part of our continuing mission to provide you with exceptional heart care, we have created designated Provider Care Teams.  These Care Teams include your primary Cardiologist (physician) and Advanced Practice Providers (APPs -  Physician Assistants and Nurse Practitioners) who all work together to provide you with the care you need, when you need it.  We recommend signing up for the patient portal called "MyChart".  Sign up information is provided on this After Visit Summary.  MyChart is used to connect with patients for Virtual Visits (Telemedicine).  Patients are able to view lab/test results, encounter notes, upcoming appointments, etc.  Non-urgent messages can be sent to your provider as well.   To learn more about what you can do with MyChart, go to  [email or web address].    Your next appointment:   12 month(s)  The format for your next appointment:   In Person  Provider:   Eleonore Chiquito, MD   Other Instructions None     Time Spent with Patient: I have spent a total of 25 minutes with patient reviewing hospital notes, telemetry, EKGs, labs and examining the patient as well as establishing an assessment and plan that was discussed with the patient.  > 50% of time was spent in direct patient care.  Signed, Addison Naegeli. Audie Box, [place]  [address] [place], [place] [address] [phone]  06/17/2020 2:50 PM

## 2020-06-17 ENCOUNTER — Ambulatory Visit: Payer: Medicare HMO | Admitting: Cardiovascular Disease

## 2020-06-17 ENCOUNTER — Encounter: Payer: Self-pay | Admitting: Cardiovascular Disease

## 2020-06-17 ENCOUNTER — Other Ambulatory Visit: Payer: Self-pay

## 2020-06-17 VITALS — BP 106/72 | HR 75 | Ht 68.0 in | Wt 159.0 lb

## 2020-06-17 DIAGNOSIS — E782 Mixed hyperlipidemia: Secondary | ICD-10-CM

## 2020-06-17 DIAGNOSIS — I639 Cerebral infarction, unspecified: Secondary | ICD-10-CM | POA: Diagnosis not present

## 2020-06-17 DIAGNOSIS — I77810 Thoracic aortic ectasia: Secondary | ICD-10-CM

## 2020-06-17 MED ORDER — PRAVASTATIN SODIUM 10 MG PO TABS: 10.0000 mg | ORAL_TABLET | Freq: Every evening | ORAL | 3 refills | Status: DC

## 2020-06-17 NOTE — Patient Instructions (Signed)
Medication Instructions:  You prescription for pravastatin was refilled. The refill is for 10-mg tablets, so take a whole tablet once you get it refilled. *If you need a refill on your cardiac medications before your next appointment, please call your pharmacy*   Lab Work: None ordered If you have labs (blood work) drawn today and your tests are completely normal, you will receive your results only by: [place] MyChart Message (if you have MyChart) OR . A paper copy in the mail If you have any lab test that is abnormal or we need to change your treatment, we will call you to review the results.   Testing/Procedures: None ordered   Follow-Up: At [facility], you and your health needs are our priority.  As part of our continuing mission to provide you with exceptional heart care, we have created designated Provider Care Teams.  These Care Teams include your primary Cardiologist (physician) and Advanced Practice Providers (APPs -  Physician Assistants and Nurse Practitioners) who all work together to provide you with the care you need, when you need it.  We recommend signing up for the patient portal called "MyChart".  Sign up information is provided on this After Visit Summary.  MyChart is used to connect with patients for Virtual Visits (Telemedicine).  Patients are able to view lab/test results, encounter notes, upcoming appointments, etc.  Non-urgent messages can be sent to your provider as well.   To learn more about what you can do with MyChart, go to [email or web address].    Your next appointment:   12 month(s)  The format for your next appointment:   In Person  Provider:   Eleonore Chiquito, MD   Other Instructions None

## 2020-06-21 DIAGNOSIS — G4700 Insomnia, unspecified: Secondary | ICD-10-CM | POA: Diagnosis not present

## 2020-06-21 DIAGNOSIS — N5201 Erectile dysfunction due to arterial insufficiency: Secondary | ICD-10-CM | POA: Diagnosis not present

## 2020-06-21 DIAGNOSIS — Z23 Encounter for immunization: Secondary | ICD-10-CM | POA: Diagnosis not present

## 2020-06-21 DIAGNOSIS — Z8673 Personal history of transient ischemic attack (TIA), and cerebral infarction without residual deficits: Secondary | ICD-10-CM | POA: Diagnosis not present

## 2020-06-21 DIAGNOSIS — Z6824 Body mass index (BMI) 24.0-24.9, adult: Secondary | ICD-10-CM | POA: Diagnosis not present

## 2020-06-21 DIAGNOSIS — N401 Enlarged prostate with lower urinary tract symptoms: Secondary | ICD-10-CM | POA: Diagnosis not present

## 2020-06-26 ENCOUNTER — Ambulatory Visit (INDEPENDENT_AMBULATORY_CARE_PROVIDER_SITE_OTHER): Payer: Medicare HMO

## 2020-06-26 DIAGNOSIS — I639 Cerebral infarction, unspecified: Secondary | ICD-10-CM | POA: Diagnosis not present

## 2020-06-26 LAB — CUP PACEART REMOTE DEVICE CHECK
Date Time Interrogation Session: 20211026230534
Implantable Pulse Generator Implant Date: 20210111

## 2020-06-28 NOTE — Progress Notes (Signed)
Carelink Summary Report / Loop Recorder 

## 2020-07-01 DIAGNOSIS — E559 Vitamin D deficiency, unspecified: Secondary | ICD-10-CM | POA: Diagnosis not present

## 2020-07-01 DIAGNOSIS — Z23 Encounter for immunization: Secondary | ICD-10-CM | POA: Diagnosis not present

## 2020-07-01 DIAGNOSIS — E7800 Pure hypercholesterolemia, unspecified: Secondary | ICD-10-CM | POA: Diagnosis not present

## 2020-07-01 DIAGNOSIS — Z0000 Encounter for general adult medical examination without abnormal findings: Secondary | ICD-10-CM | POA: Diagnosis not present

## 2020-07-01 DIAGNOSIS — Z6824 Body mass index (BMI) 24.0-24.9, adult: Secondary | ICD-10-CM | POA: Diagnosis not present

## 2020-07-01 DIAGNOSIS — Z8673 Personal history of transient ischemic attack (TIA), and cerebral infarction without residual deficits: Secondary | ICD-10-CM | POA: Diagnosis not present

## 2020-07-01 DIAGNOSIS — R5383 Other fatigue: Secondary | ICD-10-CM | POA: Diagnosis not present

## 2020-07-22 ENCOUNTER — Ambulatory Visit: Payer: Medicare HMO | Admitting: Adult Health

## 2020-07-22 ENCOUNTER — Encounter: Payer: Self-pay | Admitting: Adult Health

## 2020-07-22 VITALS — BP 119/73 | HR 84 | Ht 68.0 in | Wt 158.0 lb

## 2020-07-22 DIAGNOSIS — I639 Cerebral infarction, unspecified: Secondary | ICD-10-CM | POA: Diagnosis not present

## 2020-07-22 DIAGNOSIS — G08 Intracranial and intraspinal phlebitis and thrombophlebitis: Secondary | ICD-10-CM

## 2020-07-22 DIAGNOSIS — G4700 Insomnia, unspecified: Secondary | ICD-10-CM | POA: Diagnosis not present

## 2020-07-22 NOTE — Patient Instructions (Addendum)
You will be called to schedule sleep study to further assess for possible underlying causes of insomnia   Referral placed to psychology for CBT-I - you will be called to schedule Will attempt [facility] first but if booked out, we will further look in to other options  I will further discuss with Dr. Leonie Man regarding right transverse sinus thrombosis to ensure no further recommendations or interventions from our standpoint  Continue aspirin 81 mg daily  and atorvastatin  for secondary stroke prevention  Continue to follow up with PCP regarding cholesterol and blood pressure management  Maintain strict control of hypertension with blood pressure goal below 130/90 and cholesterol with LDL cholesterol (bad cholesterol) goal below 70 mg/dL.        Thank you for coming to see [place] at [facility]. I hope we have been able to provide you high quality care today.  You may receive a patient satisfaction survey over the next few weeks. We would appreciate your feedback and comments so that we may continue to improve ourselves and the health of our patients.

## 2020-07-22 NOTE — Progress Notes (Signed)
[facility] [address] [place]. [place] [address] [phone]       STROKE FOLLOW UP NOTE  [name]. Date of Birth:  [birth year] Medical Record Number:  [id number]   Reason for Referral: stroke follow up    CHIEF COMPLAINT:  Chief Complaint  Patient presents with  . Follow-up    stroke fu, rm 9, alone,  pt states he is doing well     HPI:  Today, 07/22/2020, Bryce Logan returns for 2-month stroke follow-up. Reports residual deficits of speech impairment and slight decreased right hand fine motor skills but does report improvement since prior visit. He believes he is pretty close to his baseline. Denies new or worsening stroke/TIA symptoms. Remains on aspirin and atorvastatin for secondary stroke prevention without side effects. Blood pressure today 119/73. Loop recorder has not shown atrial fibrillation thus far. Due to prior complaints of insomnia, he was referred to [place] sleep clinic and evaluated by Dr. Rexene Alberts who recommended undergoing sleep study and is now ready to proceed with sleep study.  He also questions psychologist for CBT-I for possible benefit.  He did undergo repeat MRV on 06/07/2020 which showed consistent right transverse sinus thrombosis. Per review of epic, Dr. Estanislado Pandy recommends repeating MRV in 1 year with conservative management as he has been asymptomatic and stable images.  He questions need of neurology input regarding management of sinus thrombosis.  No further concerns at this time.   History provided for reference purposes only Update 01/15/2020 JM: Bryce Logan returns for follow-up regarding cryptogenic left MCA stroke in 08/2018.  Residual deficits of speech impairment and mild decrease right hand dexterity.  Endorses ongoing improvement.  Chronic history of vertigo previously diagnosed with BPPV by ENT without recurrent debilitating episode in over 2 to 3 years.  He does report recent vertigo episode lasting only short duration and only  mild intensity where he was still able to maintain activities.  Symptoms resolved the following day.  History of chronic migraines with aura approximately 2-3 times per year.  Continues on aspirin 81 mg daily and atorvastatin 10 mg daily for secondary stroke prevention.  Cardiology recently switch statins from Crestor to atorvastatin due to darkened urine and insomnia and concern for possible medication side effect. Darkened urine resolved since change.  Blood pressure today 128/85.  Loop recorder has not shown atrial fibrillation thus far.  History of insomnia previously undergoing sleep study "over 25 years ago" and was negative for sleep apnea.  He is questioning possible need of repeat study.  Currently on Ambien and Xanax as needed as well as melatonin 5 mg nightly.  Recent follow-up with [facility] after completion of MRV with findings likely consistent with chronic proximal right transverse sinus thrombosis recommended continue medical management as risks of proceeding with thrombectomy outweighs potential benefit.  Also discussed new onset intermittent headaches and whooshing sound in left ear occurring on 12/04/2019 and 12/05/2019. He has not experienced whoosing epsiode since that time. He was advised to contact [facility] if symptoms reoccur.  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  Initial visit 10/16/2019 JM: Bryce Logan is a 69 year old male who is being seen today for hospital follow up. Residual deficits of verbal apraxia but does endorse ongoing improvement and continues to participate in speech therapy.  He denies cognitive or memory concerns.  He questions possible return to driving.  Completed 3 weeks DAPT and continues on aspirin alone without bleeding or bruising.  Continues on Crestor 5 mg daily as advised by cardiology without myalgias.  Had difficulty tolerating 10 mg dosage due to increased agitation  which she has experienced with use of prior statins.  Blood pressure today 118/68.  Loop recorder is not shown atrial fibrillation thus far.  He does endorse chronic history of episodic vertigo but has not experienced in the past 2 to 3 years.  Vertigo sensation can last up to 8 hours and severely limits daily functioning.  He does endorse worsening sensation with quick head movements.  He states he has previously been evaluated for the symptoms without prior diagnosis.  He does report chronic bilateral consistent tinnitus and mild hearing loss.  He also endorses history of migraines but do not present with vertigo episodes.  No further concerns at this time.  Stroke admission 09/06/2018: BryceBryce Loganis a 69 y.o.malewith history of hyperlipidemia and enlarged thoracic aortic aneurysm presented on 09/07/2019 with right-sided facial droop, aphasia, right-sided weakness.  Evaluated by stroke team and Dr. Erlinda Hong with stroke work-up revealing left MCA territory infarct as evidenced on MRI s/p TPA and IR with TICI 3 revascularization of left M2 with stroke etiology unknown.  2D echo normal EF without cardiac source of embolus.  Lower extremity venous Doppler negative.  TEE negative for PFO or cardiac source of embolus.  Loop recorder placed for monitoring of atrial fibrillation as possible etiology.  Recommended DAPT for 3 weeks and aspirin alone.  LDL 88 and recommended continuation of pravastatin 20 mg daily.  No prior history of HTN or DM with A1c 5.4.  Other stroke risk factors include advanced age, EtOH use, history of substance abuse, questionable prior history of stroke and CAD.  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  Residual deficits of moderate expressive aphasia, mild dysarthria and mild right lower facial weakness and discharged home with recommendation of outpatient  SLP.   Stroke: left MCA territory infarct s/p tPA and IR w/ TICI3 revascularization L M2, embolic secondary to unknown source  Code Stroke CT head No acute abnormality. ASPECTS 10.   CTA head &neck ELVO L MCA M2 branch just beyond bifurcation.Mild atherosclerosis  CT perfusion 24 mL core and 71 mL penumbra anterior L MCA territory   Cerebral angio occluded superior division L MCA M2 w/ TICI3 revascularization  MRI- 09/08/19 - Scattered small acute left cerebral infarcts greatest in thefrontal operculum.Abnormal appearance of the right transverse sinus most suggestiveof occlusion (versus very slow flow due to a distal stenosis).   2D Echo / bubble - EF 60 - 65%. No cardiac source of emboli identified.  LE venous doppler - no DVT  TEE neg for PFO or SOE. Did find a dilated sinus of valsalva. OP f/u with cardiology  Loop recorder placed  LDL88  HgbA1c5.4  UDS- negative      ROS:   14 system review of systems performed and negative with exception of verbal apraxia, decreased hand dexterity, insomnia  PMH:  Past Medical History:  Diagnosis Date  . Chronic prostatitis    ELEVATED PSA . ED DR. DAHLSTEDT  . Coronary artery disease   . Depression   . Dizziness 11/11   DR. ROSEN, NORMAL EF, MILD LEAKINESS OF MITRAL VALVE, MILD STIFFNESS OF HEART MUSCLE, MILD ENLARGEMENT OF AORTIC ROOT, REPEAT IN 1 YEAR  DR. Radford Pax, NL ETT DR. Radford Pax 12/11  . H/O: GI bleed 2002   DR. HAYES   . Hyperlipidemia   . Insomnia    ON XANAX, DR. Sabra Heck  . Intracerebral hematoma ([place]) 2013   [place]  . Mild bipolar disorder ([place]) 01/2010   DR. PITTMAN DR. Toy Care, CHR DEPRESSION, FATIGUE OFF MEDS FOR AWHILE, CONTROLLED  . Tubular adenoma 09/17/11   [facility]    PSH:  Past Surgical History:  Procedure Laterality Date  . BUBBLE STUDY  09/12/2019   Procedure: BUBBLE STUDY;  Surgeon: [name], MD;  Location: [place];  Service: Cardiovascular;;  .  COLONOSCOPY  2002  . IR CT HEAD LTD  09/07/2019  . IR PERCUTANEOUS ART THROMBECTOMY/INFUSION INTRACRANIAL INC DIAG ANGIO  09/07/2019  . LEFT KNEE ATHROSCOPY  2002  . LEFT LEG AMBULATORY PHLEBECTOMY  2002  . LOOP RECORDER INSERTION N/A 09/12/2019   Procedure: LOOP RECORDER INSERTION;  Surgeon: Thompson Grayer, MD;  Location: [facility];  Service: Cardiovascular;  Laterality: N/A;  . RADIOLOGY WITH ANESTHESIA N/A 09/07/2019   Procedure: RADIOLOGY WITH ANESTHESIA;  Surgeon: Luanne Bras, MD;  Location: [facility];  Service: Radiology;  Laterality: N/A;  . TEE WITHOUT CARDIOVERSION N/A 09/12/2019   Procedure: TRANSESOPHAGEAL ECHOCARDIOGRAM (TEE);  Surgeon: [name], MD;  Location: [facility];  Service: Cardiovascular;  Laterality: N/A;    Social History:  Social History   Socioeconomic History  . Marital status: Divorced    Spouse name: Not on file  . Number of children: Not on file  . Years of education: Not on file  . Highest education level: Not on file  Occupational History  . Not on file  Tobacco Use  . Smoking status: Never Smoker  . Smokeless tobacco: Never Used  Vaping Use  . Vaping Use: Never assessed  Substance and Sexual Activity  . Alcohol use: Yes    Alcohol/week: 2.0 - 6.0 standard drinks    Types: 2 - 6 Glasses of wine per week    Comment: 2-6 PER WEEK  . Drug use: Not Currently    Types: Marijuana  . Sexual activity: Not Currently  Other Topics Concern  . Not on file  Social History Narrative  . Not on file   Social Determinants of Health   Financial Resource Strain:   . Difficulty of Paying Living Expenses: Not on file  Food Insecurity:   . Worried About Charity fundraiser in the Last Year: Not on file  . Ran Out of Food in the Last Year: Not on file  Transportation Needs:   . Lack of Transportation (Medical): Not on file  . Lack of Transportation (Non-Medical): Not on file  Physical Activity:   . Days of Exercise per Week: Not on file  .  Minutes of Exercise per Session: Not on file  Stress:   . Feeling of Stress : Not on file  Social Connections:   . Frequency of Communication with Friends and Family: Not on file  . Frequency of Social Gatherings with Friends and Family: Not on file  . Attends Religious Services: Not on file  . Active Member of Clubs or Organizations: Not on file  . Attends Archivist Meetings: Not on file  . Marital Status: Not on file  Intimate Partner Violence:   . Fear of Current or Ex-Partner: Not on file  . Emotionally Abused: Not on file  . Physically Abused: Not on file  .  Sexually Abused: Not on file    Family History:  Family History  Problem Relation Age of Onset  . Cancer - Other Mother   . Hypertension Father   . Heart disease Father   . Melanoma Sister   . Alcoholism Son   . Alcohol abuse Son     Medications:   Current Outpatient Medications on File Prior to Visit  Medication Sig Dispense Refill  . Acetylcysteine (NAC PO) Take 1,000-2,000 mg by mouth.    . ALPRAZolam (XANAX) 0.5 MG tablet Take 1 mg by mouth at bedtime.   1  . Ascorbic Acid (VITAMIN C) 1000 MG tablet Take 1,000 mg by mouth daily.    Marland Kitchen aspirin (ASPIR-LOW) 81 MG EC tablet Take 81 mg by mouth daily. Swallow whole.    . Cholecalciferol (VITAMIN D3) 1.25 MG (50000 UT) CAPS Take by mouth as needed.     . Coenzyme Q10 (CO Q 10) 100 MG CAPS Take 1 capsule by mouth daily.    . Magnesium 300 MG CAPS Take 300 mg by mouth daily.     . Melatonin 5 MG CAPS Take 10 mg by mouth at bedtime.    . Niacinamide-Zn-Cu-Methfo-Se-Cr (NICOTINAMIDE PO) Take by mouth.    . NON FORMULARY LITHIUM OROTATE    . NON FORMULARY Take 1 tablet by mouth daily. Sam-e 400mg  daily    . pravastatin (PRAVACHOL) 10 MG tablet Take 1 tablet (10 mg total) by mouth every evening for 90 doses. 90 tablet 3  . RESVERATROL PO Take 1,000 mg by mouth daily.     . tadalafil (CIALIS) 5 MG tablet Take 5 mg by mouth daily as needed for erectile  dysfunction.    Marland Kitchen UNABLE TO FIND Med Name: ashwao Supplement    . UNABLE TO FIND Take 650 mg by mouth. Med Name: Ashwagnandha    . UNABLE TO FIND Take 8 mg by mouth. Med Name: Astaxanthin    . zolpidem (AMBIEN) 10 MG tablet Take 5 mg by mouth at bedtime as needed for sleep. Reports breaking tablet into 1/3 tablet     No current facility-administered medications on file prior to visit.    Allergies:   Allergies  Allergen Reactions  . Sulfa Antibiotics     SEVERE FATIGUE AND SIDE EFFECTS     Physical Exam  Vitals:   07/22/20 1445  BP: 119/73  Pulse: 84  Weight: 158 lb (71.7 kg)  Height: 5\' 8"  (1.727 m)   Body mass index is 24.02 kg/m. No exam data present  General: well developed, well nourished,  pleasant middle-age Caucasian male, seated, in no evident distress Head: head normocephalic and atraumatic.   Neck: supple with no carotid or supraclavicular bruits Cardiovascular: regular rate and rhythm, no murmurs Musculoskeletal: no deformity Skin:  no rash/petichiae Vascular:  Normal pulses all extremities   Neurologic Exam Mental Status: Awake and fully alert.   Mild verbal apraxia with word hesitancy.  Oriented to place and time. Recent and remote memory intact. Attention span, concentration and fund of knowledge appropriate. Mood and affect appropriate.  Cranial Nerves: Pupils equal, briskly reactive to light. Extraocular movements full without nystagmus. Visual fields full to confrontation. Hearing intact. Facial sensation intact. Face, tongue, palate moves normally and symmetrically.  Motor: Normal bulk and tone. Normal strength in all tested extremity muscles  Sensory.: intact to touch , pinprick , position and vibratory sensation.  Coordination: Rapid alternating movements normal in all extremities. Finger-to-nose and heel-to-shin performed accurately bilaterally. Gait and  Station: Arises from chair without difficulty. Stance is normal. Gait demonstrates normal stride  length and balance without use of assistive device Reflexes: 1+ and symmetric. Toes downgoing.       ASSESSMENT: Bryce Logan. is a 69 y.o. year old male presented with right-sided facial droop, aphasia and right-sided weakness on 09/07/2019 with a stroke work-up revealing left MCA territory infarct s/p TPA and IR with TICI 3 revascularization of left M2 secondary to unknown source with loop recorder placed for possible atrial fibrillation as etiology.  MRI showed right TS stenosis/occlusion.  Vascular risk factors include HLD, EtOH use, history of substance abuse, questionable history of stroke and CAD.     PLAN:  1. Cryptogenic left MCA stroke:  a. Residual verbal apraxia with patient reporting continued improvement since prior visit.   b. Continue aspirin 81 mg daily  and pravastatin for secondary stroke prevention.   c. Loop recorder negative for atrial fibrillation thus far -continuously monitored by cardiology d. Discussed secondary stroke prevention measures and importance of close PCP follow-up for aggressive stroke risk factor management 2. R TS thrombus:  a. MRV 10/8 per report showed nondominant right transverse sinus demonstrates confluent opacification, no dural sinus thrombosis present.  Plans on repeating MRV around 05/2021 with Dr. Estanislado Pandy as he has remained asymptomatic and stable appearance of serial surveillance imaging -continues conservative management b. He questions need of evaluation by neurologist.  Advised him that Dr. Estanislado Pandy is a neuro interventional radiologist and typically manages sinus thrombosis.  Advised him that I will further discuss with Dr. Leonie Man for his input regarding need of further input from a neurologist which he appreciated 3. Insomnia: Evaluated by Dr. Rexene Alberts on 02/01/2020 who recommended proceeding with sleep study.  Notify [facility] sleep clinic who plans on calling patient to schedule sleep study.  Referral also placed per patient request for  psychology for CBT-I 4. Hx of BPPV: Stable without recent episode   Overall stable from stroke standpoint and recommend follow-up on an as-needed basis at this time   CC:  [place] provider: Dr. Lyn Records, Lattie Haw, MD    I spent 40 minutes of face-to-face and non-face-to-face time with patient.  This included previsit chart review, lab review, study review, order entry, electronic health record documentation, patient education regarding cryptogenic stroke and residual deficits, right TS thrombus with routine monitoring and indication for conservative management, importance of managing stroke risk factors, history of BPPV, insomnia with further evaluation of sleep study and possible benefit of CBT-I and answered all other questions to patient satisfaction    Frann Rider, AGNP-BC  [facility] [address] [place], [place] [address]  Phone [phone] Fax [phone] Note: This document was prepared with digital dictation and possible smart phrase technology. Any transcriptional errors that result from this process are unintentional.

## 2020-07-23 NOTE — Progress Notes (Signed)
I agree with the above plan 

## 2020-07-23 NOTE — Progress Notes (Signed)
Kindly advise him that he has had both cryptogenic stroke and right transverse sinus cerebral venous sinus thrombosis. Both of these are brain vascular neurological problems with risk of recurrence hence he needs f/u with neurology

## 2020-07-28 LAB — CUP PACEART REMOTE DEVICE CHECK
Date Time Interrogation Session: 20211126230131
Implantable Pulse Generator Implant Date: 20210111

## 2020-07-29 ENCOUNTER — Ambulatory Visit (INDEPENDENT_AMBULATORY_CARE_PROVIDER_SITE_OTHER): Payer: Medicare HMO

## 2020-07-29 DIAGNOSIS — I639 Cerebral infarction, unspecified: Secondary | ICD-10-CM

## 2020-08-09 DIAGNOSIS — F329 Major depressive disorder, single episode, unspecified: Secondary | ICD-10-CM | POA: Diagnosis not present

## 2020-08-09 DIAGNOSIS — E7800 Pure hypercholesterolemia, unspecified: Secondary | ICD-10-CM | POA: Diagnosis not present

## 2020-08-09 DIAGNOSIS — G4700 Insomnia, unspecified: Secondary | ICD-10-CM | POA: Diagnosis not present

## 2020-08-09 DIAGNOSIS — N401 Enlarged prostate with lower urinary tract symptoms: Secondary | ICD-10-CM | POA: Diagnosis not present

## 2020-08-28 LAB — CUP PACEART REMOTE DEVICE CHECK
Date Time Interrogation Session: 20211227230339
Implantable Pulse Generator Implant Date: 20210111

## 2020-09-02 ENCOUNTER — Ambulatory Visit (INDEPENDENT_AMBULATORY_CARE_PROVIDER_SITE_OTHER): Payer: Medicare HMO

## 2020-09-02 DIAGNOSIS — I6602 Occlusion and stenosis of left middle cerebral artery: Secondary | ICD-10-CM | POA: Diagnosis not present

## 2020-09-09 DIAGNOSIS — F4320 Adjustment disorder, unspecified: Secondary | ICD-10-CM | POA: Diagnosis not present

## 2020-09-17 NOTE — Progress Notes (Signed)
Carelink Summary Report / Loop Recorder 

## 2020-09-27 ENCOUNTER — Ambulatory Visit (INDEPENDENT_AMBULATORY_CARE_PROVIDER_SITE_OTHER): Payer: Medicare HMO

## 2020-09-27 DIAGNOSIS — I639 Cerebral infarction, unspecified: Secondary | ICD-10-CM

## 2020-09-27 LAB — CUP PACEART REMOTE DEVICE CHECK
Date Time Interrogation Session: 20220127230252
Implantable Pulse Generator Implant Date: 20210111

## 2020-10-05 NOTE — Progress Notes (Signed)
Carelink Summary Report / Loop Recorder 

## 2020-10-28 ENCOUNTER — Ambulatory Visit (INDEPENDENT_AMBULATORY_CARE_PROVIDER_SITE_OTHER): Payer: Medicare HMO

## 2020-10-28 DIAGNOSIS — I639 Cerebral infarction, unspecified: Secondary | ICD-10-CM | POA: Diagnosis not present

## 2020-10-28 LAB — CUP PACEART REMOTE DEVICE CHECK
Date Time Interrogation Session: 20220227230616
Implantable Pulse Generator Implant Date: 20210111

## 2020-11-05 DIAGNOSIS — L570 Actinic keratosis: Secondary | ICD-10-CM | POA: Diagnosis not present

## 2020-11-05 DIAGNOSIS — L853 Xerosis cutis: Secondary | ICD-10-CM | POA: Diagnosis not present

## 2020-11-05 DIAGNOSIS — L820 Inflamed seborrheic keratosis: Secondary | ICD-10-CM | POA: Diagnosis not present

## 2020-11-05 DIAGNOSIS — L821 Other seborrheic keratosis: Secondary | ICD-10-CM | POA: Diagnosis not present

## 2020-11-05 DIAGNOSIS — L2089 Other atopic dermatitis: Secondary | ICD-10-CM | POA: Diagnosis not present

## 2020-11-05 DIAGNOSIS — D225 Melanocytic nevi of trunk: Secondary | ICD-10-CM | POA: Diagnosis not present

## 2020-11-05 DIAGNOSIS — L579 Skin changes due to chronic exposure to nonionizing radiation, unspecified: Secondary | ICD-10-CM | POA: Diagnosis not present

## 2020-11-05 DIAGNOSIS — L814 Other melanin hyperpigmentation: Secondary | ICD-10-CM | POA: Diagnosis not present

## 2020-11-05 DIAGNOSIS — B078 Other viral warts: Secondary | ICD-10-CM | POA: Diagnosis not present

## 2020-11-05 NOTE — Progress Notes (Signed)
Carelink Summary Report / Loop Recorder 

## 2020-11-28 ENCOUNTER — Other Ambulatory Visit: Payer: Self-pay

## 2020-11-28 ENCOUNTER — Ambulatory Visit (INDEPENDENT_AMBULATORY_CARE_PROVIDER_SITE_OTHER): Payer: Medicare HMO

## 2020-11-28 DIAGNOSIS — I639 Cerebral infarction, unspecified: Secondary | ICD-10-CM

## 2020-11-28 LAB — CUP PACEART REMOTE DEVICE CHECK
Date Time Interrogation Session: 20220330230510
Implantable Pulse Generator Implant Date: 20210111

## 2020-12-11 NOTE — Progress Notes (Signed)
Carelink Summary Report / Loop Recorder 

## 2020-12-30 ENCOUNTER — Ambulatory Visit (INDEPENDENT_AMBULATORY_CARE_PROVIDER_SITE_OTHER): Payer: Medicare HMO

## 2020-12-30 DIAGNOSIS — I639 Cerebral infarction, unspecified: Secondary | ICD-10-CM | POA: Diagnosis not present

## 2020-12-31 LAB — CUP PACEART REMOTE DEVICE CHECK
Date Time Interrogation Session: 20220430230132
Implantable Pulse Generator Implant Date: 20210111

## 2021-01-05 IMAGING — MR MR MRV HEAD WO/W CM
4 of 5 series · 19 of 48 positions shown · IV contrast (Y GAD)
Comparison: MR venogram without contrast 12/08/2019

CLINICAL DATA: Middle cerebral artery embolism, left. Transverse
sinus thrombosis.

EXAM:
MR VENOGRAM HEAD WITHOUT AND WITH CONTRAST
TECHNIQUE: Angiographic images of the intracranial venous structures were
obtained using MRV technique without and with intravenous contrast.
CONTRAST:  7mL GADAVIST GADOBUTROL 1 MMOL/ML IV SOLN

[Series 2: MRV · coronal · 1.5mm · 0.43mm/px · 6 of 142 slices shown]
[im 1/142]
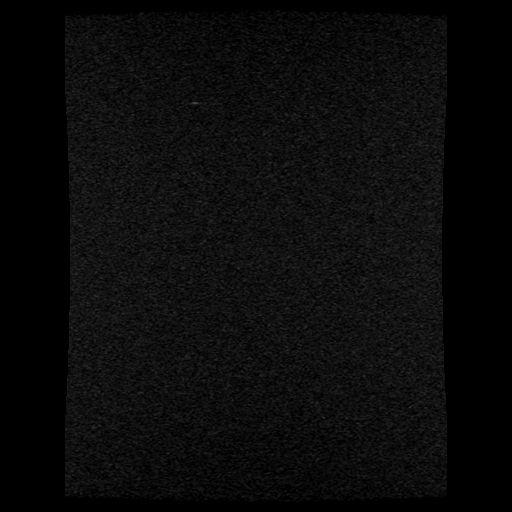
[im 29/142]
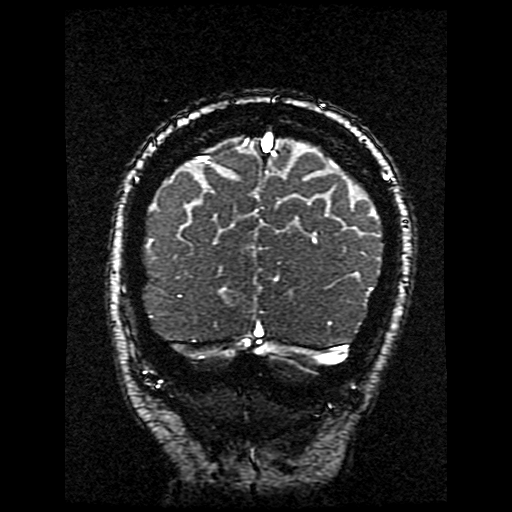
[im 57/142]
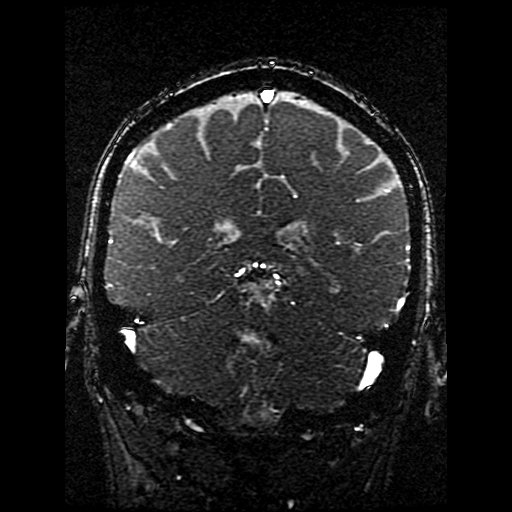
[im 85/142]
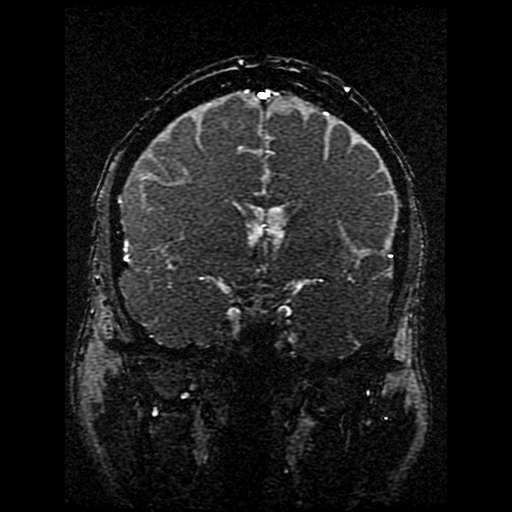
[im 113/142]
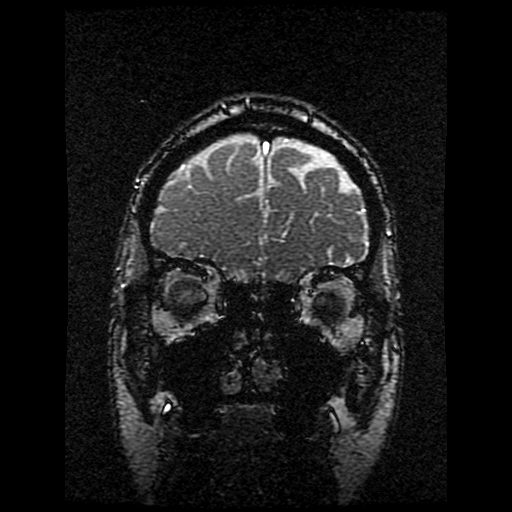
[im 142/142]
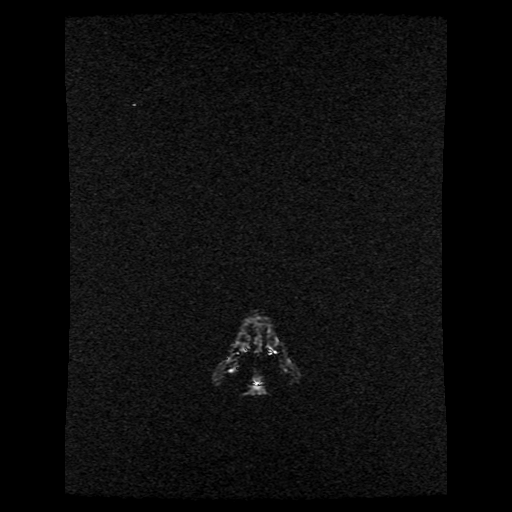

[Series 3: sag inhance (id) · sagittal · 1.8mm · 0.47mm/px · 7 of 333 slices shown]
[im 1/333]
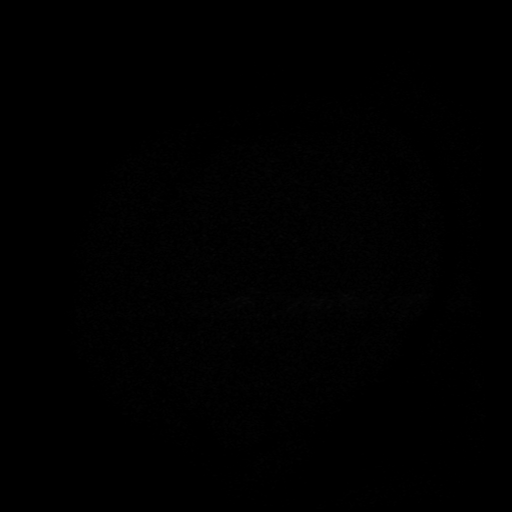
[im 48/333]
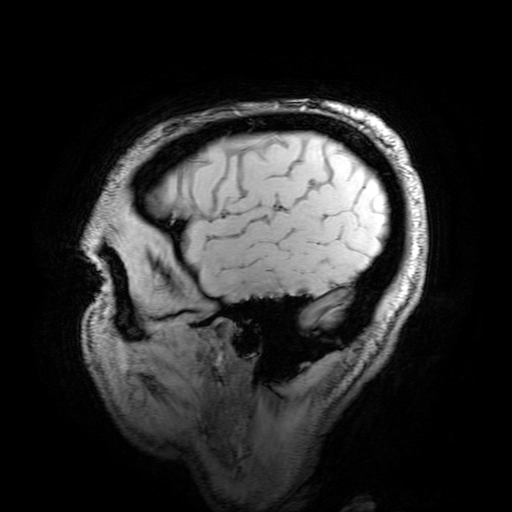
[im 95/333]
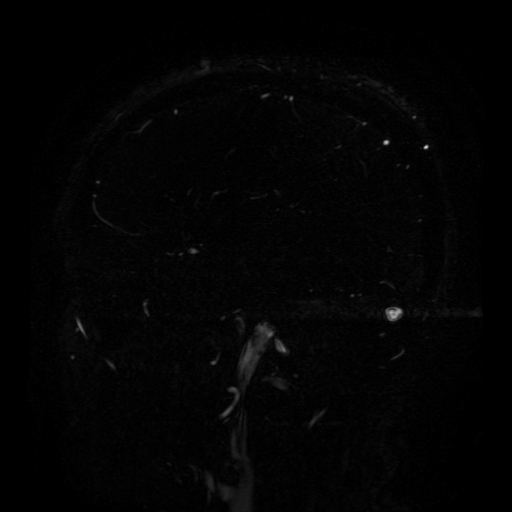
[im 143/333]
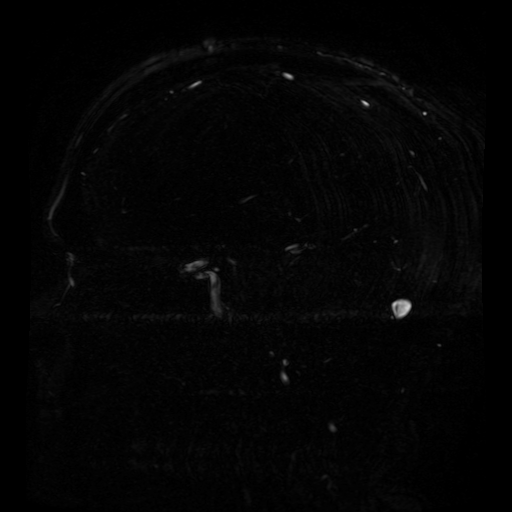
[im 167/333]
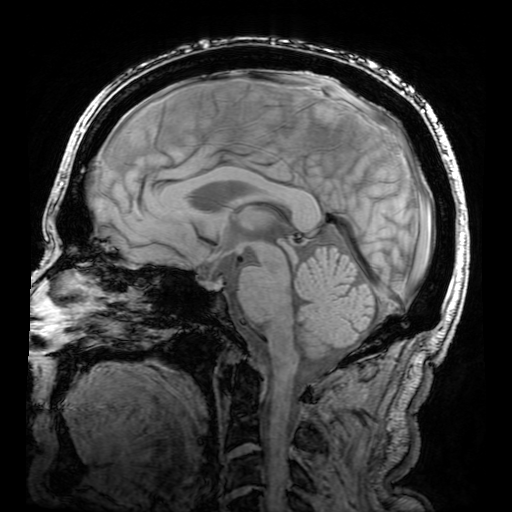
[im 190/333]
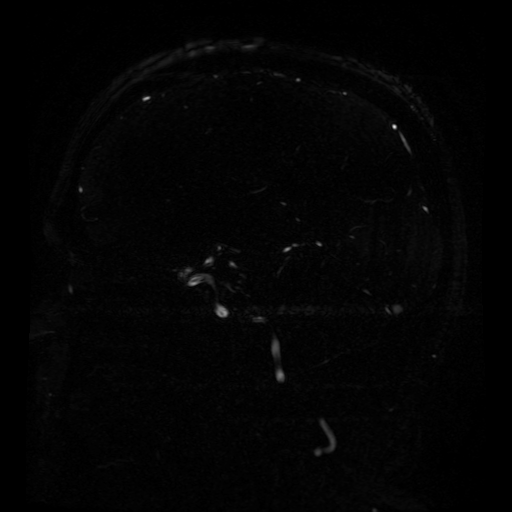
[im 285/333]
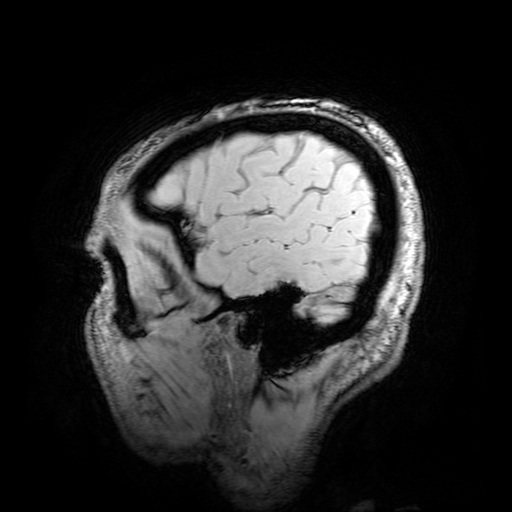

[Series 400: multiplanar reconstruction (mpr) · sagittal · 0.9mm · 0.47mm/px · 3 of 201 slices shown (1 of 2)]
[im 26/201]
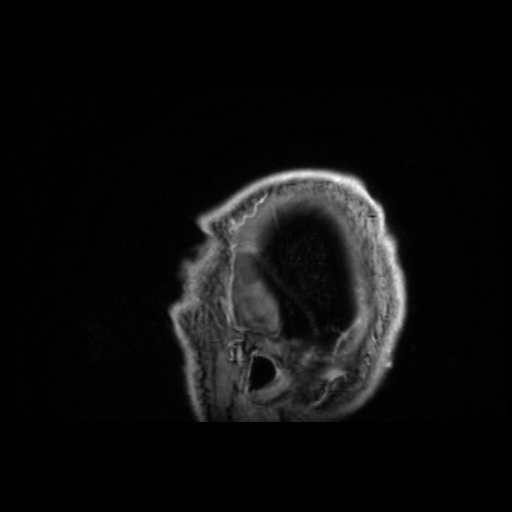
[im 101/201]
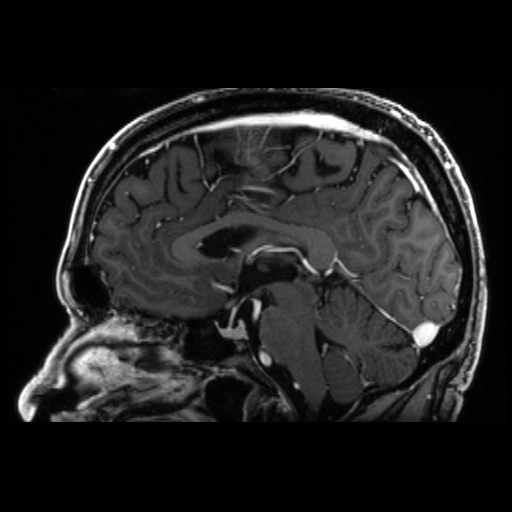
[im 176/201]
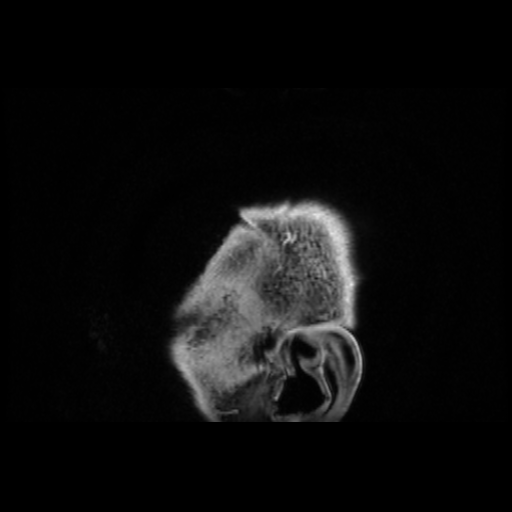

[Series 401: multiplanar reconstruction (mpr) · coronal · 0.9mm · 0.47mm/px · 3 of 254 slices shown (2 of 2)]
[im 26/254]
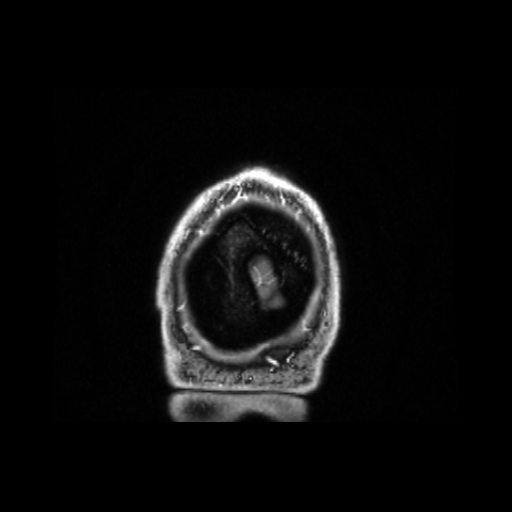
[im 127/254]
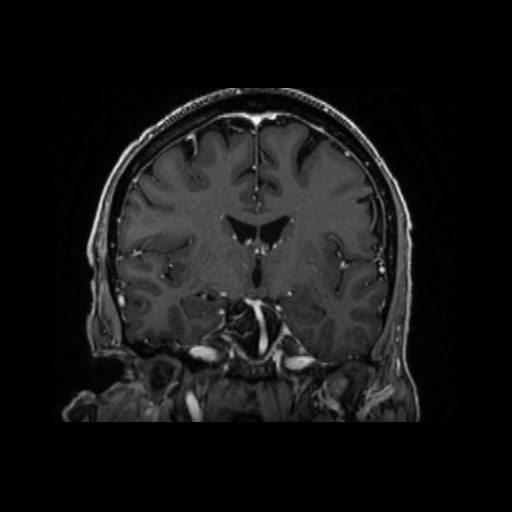
[im 228/254]
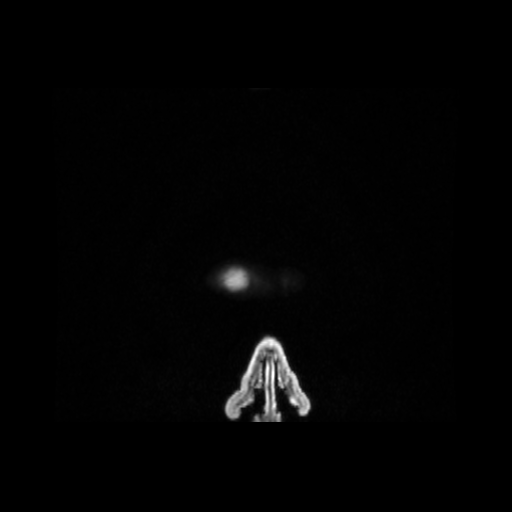

[19 of 48 positions shown; findings below may reference images not displayed]

FINDINGS: The time-of-flight images again demonstrate some flow disturbance in
the medial right transverse sinus. The Inhance sequence demonstrates
flow in the non dominant right transverse sinus. Postcontrast images
demonstrate confluent opacification of the right transverse sinus.
The left transverse sinus is dominant. Straight sinus deep cerebral
veins are intact. Cortical veins are unremarkable.
IMPRESSION: 1. Non dominant right transverse sinus demonstrates confluent
opacification on postcontrast images and flow with the Inhance
sequence. No dural sinus thrombosis is present.
2. Normal MRV of the head.

## 2021-01-17 NOTE — Progress Notes (Signed)
Carelink Summary Report / Loop Recorder 

## 2021-01-29 LAB — CUP PACEART REMOTE DEVICE CHECK
Date Time Interrogation Session: 20220531230451
Implantable Pulse Generator Implant Date: 20210111

## 2021-01-30 ENCOUNTER — Ambulatory Visit (INDEPENDENT_AMBULATORY_CARE_PROVIDER_SITE_OTHER): Payer: Medicare HMO

## 2021-01-30 DIAGNOSIS — I639 Cerebral infarction, unspecified: Secondary | ICD-10-CM | POA: Diagnosis not present

## 2021-02-24 NOTE — Progress Notes (Signed)
Carelink Summary Report / Loop Recorder 

## 2021-03-04 ENCOUNTER — Ambulatory Visit (INDEPENDENT_AMBULATORY_CARE_PROVIDER_SITE_OTHER): Payer: Medicare HMO

## 2021-03-04 DIAGNOSIS — I639 Cerebral infarction, unspecified: Secondary | ICD-10-CM | POA: Diagnosis not present

## 2021-03-06 LAB — CUP PACEART REMOTE DEVICE CHECK
Date Time Interrogation Session: 20220701230558
Implantable Pulse Generator Implant Date: 20210111

## 2021-03-25 NOTE — Progress Notes (Signed)
Carelink Summary Report / Loop Recorder 

## 2021-04-02 LAB — CUP PACEART REMOTE DEVICE CHECK
Date Time Interrogation Session: 20220801230043
Implantable Pulse Generator Implant Date: 20210111

## 2021-04-07 ENCOUNTER — Ambulatory Visit (INDEPENDENT_AMBULATORY_CARE_PROVIDER_SITE_OTHER): Payer: Medicare HMO

## 2021-04-07 DIAGNOSIS — I639 Cerebral infarction, unspecified: Secondary | ICD-10-CM

## 2021-04-29 DIAGNOSIS — G4700 Insomnia, unspecified: Secondary | ICD-10-CM | POA: Diagnosis not present

## 2021-04-29 DIAGNOSIS — E7800 Pure hypercholesterolemia, unspecified: Secondary | ICD-10-CM | POA: Diagnosis not present

## 2021-04-29 DIAGNOSIS — N401 Enlarged prostate with lower urinary tract symptoms: Secondary | ICD-10-CM | POA: Diagnosis not present

## 2021-04-29 DIAGNOSIS — F329 Major depressive disorder, single episode, unspecified: Secondary | ICD-10-CM | POA: Diagnosis not present

## 2021-05-01 NOTE — Progress Notes (Signed)
Carelink Summary Report / Loop Recorder 

## 2021-05-06 ENCOUNTER — Ambulatory Visit (INDEPENDENT_AMBULATORY_CARE_PROVIDER_SITE_OTHER): Payer: Medicare HMO

## 2021-05-06 DIAGNOSIS — I639 Cerebral infarction, unspecified: Secondary | ICD-10-CM | POA: Diagnosis not present

## 2021-05-06 LAB — CUP PACEART REMOTE DEVICE CHECK
Date Time Interrogation Session: 20220901230457
Implantable Pulse Generator Implant Date: 20210111

## 2021-05-15 NOTE — Progress Notes (Signed)
Carelink Summary Report / Loop Recorder 

## 2021-05-29 DIAGNOSIS — Z8673 Personal history of transient ischemic attack (TIA), and cerebral infarction without residual deficits: Secondary | ICD-10-CM | POA: Diagnosis not present

## 2021-05-29 DIAGNOSIS — R42 Dizziness and giddiness: Secondary | ICD-10-CM | POA: Diagnosis not present

## 2021-05-29 DIAGNOSIS — R519 Headache, unspecified: Secondary | ICD-10-CM | POA: Diagnosis not present

## 2021-05-30 ENCOUNTER — Telehealth: Payer: Self-pay | Admitting: Cardiovascular Disease

## 2021-05-30 ENCOUNTER — Telehealth (HOSPITAL_COMMUNITY): Payer: Self-pay | Admitting: Radiology

## 2021-05-30 NOTE — Telephone Encounter (Signed)
The patient stated that yesterday when he woke up that he had a dizzy spell and this has now lasted all day. He denies any chest pain or shortness of breath.  He stated that he does have a history of vertigo and dizziness but felt like this was different.   He went ahead and did a transmission from his loop recorder.   He did state that he had sexual intercourse for 30 minutes last night and felt fine. He he was asymptomatic so he is wondering if it is not cardiac related.  Message sent to the device clinic to be on the look out for the transmission. He has also been advised to talk to his PCP as well about the dizziness. Patient has been advised that he is due for a follow up with Dr. Audie Box as well.

## 2021-05-30 NOTE — Telephone Encounter (Signed)
Returned call to pt, left VM. JM

## 2021-05-30 NOTE — Telephone Encounter (Signed)
STAT if patient feels like he/she is going to faint   Are you dizzy now? NO, PT WAS FEELING DIZZY YESTERDAY 05/29/21  Do you feel faint or have you passed out? NO  Do you have any other symptoms? LEFT ARM PAIN BUT PT PLAYS THE GUITAR SO IT MIGHT BE BECAUSE HE PLAYS GUITAR. PT STATES IT FEELS LIKE THE LEFT ARM PAIN CAN BE MUSCULAR  Have you checked your HR and BP (record if available)? 107/70 YESTERDAY AT THE DR'S APPT HR IS 82 CURRENTLY FROM HIS [facility]. PT SAID HE DOES NOT HAVE ANY SOB AND HE IS NOT HURTING AT ALL. PT WANTS TO KNOW IF HE CAN COME INTO OUR OFFICE TO GET AN EKG OR SHOULD HE CALL HIS PCP?

## 2021-05-30 NOTE — Telephone Encounter (Signed)
Data received from [place] show no arrythmias.

## 2021-05-30 NOTE — Telephone Encounter (Signed)
Patient has made aware and will follow up with PCP.

## 2021-06-03 ENCOUNTER — Other Ambulatory Visit (HOSPITAL_COMMUNITY): Payer: Self-pay | Admitting: Interventional Radiology

## 2021-06-03 DIAGNOSIS — I639 Cerebral infarction, unspecified: Secondary | ICD-10-CM

## 2021-06-03 DIAGNOSIS — R42 Dizziness and giddiness: Secondary | ICD-10-CM

## 2021-06-03 DIAGNOSIS — H9312 Tinnitus, left ear: Secondary | ICD-10-CM

## 2021-06-05 LAB — CUP PACEART REMOTE DEVICE CHECK
Date Time Interrogation Session: 20221002230125
Implantable Pulse Generator Implant Date: 20210111

## 2021-06-09 ENCOUNTER — Ambulatory Visit (INDEPENDENT_AMBULATORY_CARE_PROVIDER_SITE_OTHER): Payer: Medicare HMO

## 2021-06-09 DIAGNOSIS — I639 Cerebral infarction, unspecified: Secondary | ICD-10-CM

## 2021-06-12 ENCOUNTER — Other Ambulatory Visit: Payer: Self-pay

## 2021-06-12 ENCOUNTER — Ambulatory Visit (HOSPITAL_COMMUNITY)
Admission: RE | Admit: 2021-06-12 | Discharge: 2021-06-12 | Disposition: A | Discharge status: Home / Self Care | Payer: Medicare HMO | Source: Ambulatory Visit | Attending: Interventional Radiology | Admitting: Interventional Radiology

## 2021-06-12 DIAGNOSIS — I639 Cerebral infarction, unspecified: Secondary | ICD-10-CM | POA: Diagnosis not present

## 2021-06-12 DIAGNOSIS — I676 Nonpyogenic thrombosis of intracranial venous system: Secondary | ICD-10-CM | POA: Diagnosis not present

## 2021-06-12 DIAGNOSIS — R42 Dizziness and giddiness: Secondary | ICD-10-CM | POA: Insufficient documentation

## 2021-06-12 DIAGNOSIS — H9312 Tinnitus, left ear: Secondary | ICD-10-CM | POA: Insufficient documentation

## 2021-06-12 MED ORDER — GADOBUTROL 1 MMOL/ML IV SOLN
7.0000 mL | Freq: Once | INTRAVENOUS | Status: AC | PRN
  Administered 2021-06-12: 7 mL via INTRAVENOUS

## 2021-06-13 ENCOUNTER — Other Ambulatory Visit: Payer: Self-pay | Admitting: Cardiovascular Disease

## 2021-06-17 ENCOUNTER — Other Ambulatory Visit (HOSPITAL_COMMUNITY): Payer: Self-pay | Admitting: Interventional Radiology

## 2021-06-17 ENCOUNTER — Telehealth (HOSPITAL_COMMUNITY): Payer: Self-pay

## 2021-06-17 DIAGNOSIS — H9312 Tinnitus, left ear: Secondary | ICD-10-CM

## 2021-06-17 DIAGNOSIS — I639 Cerebral infarction, unspecified: Secondary | ICD-10-CM

## 2021-06-17 DIAGNOSIS — R519 Headache, unspecified: Secondary | ICD-10-CM

## 2021-06-17 NOTE — Telephone Encounter (Signed)
Called pt regarding recent imaging, no answer, left vm. AW  

## 2021-06-18 NOTE — Progress Notes (Signed)
Carelink Summary Report / Loop Recorder 

## 2021-06-23 ENCOUNTER — Ambulatory Visit (HOSPITAL_COMMUNITY)
Admission: RE | Admit: 2021-06-23 | Discharge: 2021-06-23 | Disposition: A | Discharge status: Home / Self Care | Payer: Medicare HMO | Source: Ambulatory Visit | Attending: Interventional Radiology | Admitting: Interventional Radiology

## 2021-06-23 ENCOUNTER — Other Ambulatory Visit: Payer: Self-pay

## 2021-06-23 DIAGNOSIS — I639 Cerebral infarction, unspecified: Secondary | ICD-10-CM

## 2021-06-23 DIAGNOSIS — H9312 Tinnitus, left ear: Secondary | ICD-10-CM | POA: Diagnosis not present

## 2021-06-23 DIAGNOSIS — R519 Headache, unspecified: Secondary | ICD-10-CM | POA: Diagnosis not present

## 2021-06-25 HISTORY — PX: IR RADIOLOGIST EVAL & MGMT: IMG5224

## 2021-07-03 LAB — CUP PACEART REMOTE DEVICE CHECK
Date Time Interrogation Session: 20221102230314
Implantable Pulse Generator Implant Date: 20210111

## 2021-07-07 DIAGNOSIS — E7800 Pure hypercholesterolemia, unspecified: Secondary | ICD-10-CM | POA: Diagnosis not present

## 2021-07-07 DIAGNOSIS — I77810 Thoracic aortic ectasia: Secondary | ICD-10-CM | POA: Diagnosis not present

## 2021-07-07 DIAGNOSIS — Z23 Encounter for immunization: Secondary | ICD-10-CM | POA: Diagnosis not present

## 2021-07-07 DIAGNOSIS — G4700 Insomnia, unspecified: Secondary | ICD-10-CM | POA: Diagnosis not present

## 2021-07-07 DIAGNOSIS — Z125 Encounter for screening for malignant neoplasm of prostate: Secondary | ICD-10-CM | POA: Diagnosis not present

## 2021-07-07 DIAGNOSIS — Z0000 Encounter for general adult medical examination without abnormal findings: Secondary | ICD-10-CM | POA: Diagnosis not present

## 2021-07-07 DIAGNOSIS — F319 Bipolar disorder, unspecified: Secondary | ICD-10-CM | POA: Diagnosis not present

## 2021-07-07 DIAGNOSIS — Z8659 Personal history of other mental and behavioral disorders: Secondary | ICD-10-CM | POA: Diagnosis not present

## 2021-07-07 DIAGNOSIS — Z8673 Personal history of transient ischemic attack (TIA), and cerebral infarction without residual deficits: Secondary | ICD-10-CM | POA: Diagnosis not present

## 2021-07-14 ENCOUNTER — Ambulatory Visit (INDEPENDENT_AMBULATORY_CARE_PROVIDER_SITE_OTHER): Payer: Medicare HMO

## 2021-07-14 DIAGNOSIS — I639 Cerebral infarction, unspecified: Secondary | ICD-10-CM | POA: Diagnosis not present

## 2021-07-21 DIAGNOSIS — L570 Actinic keratosis: Secondary | ICD-10-CM | POA: Diagnosis not present

## 2021-07-22 NOTE — Progress Notes (Signed)
Carelink Summary Report / Loop Recorder 

## 2021-07-25 DIAGNOSIS — Z20822 Contact with and (suspected) exposure to covid-19: Secondary | ICD-10-CM | POA: Diagnosis not present

## 2021-08-01 DIAGNOSIS — G4700 Insomnia, unspecified: Secondary | ICD-10-CM | POA: Diagnosis not present

## 2021-08-01 DIAGNOSIS — F329 Major depressive disorder, single episode, unspecified: Secondary | ICD-10-CM | POA: Diagnosis not present

## 2021-08-01 DIAGNOSIS — E7800 Pure hypercholesterolemia, unspecified: Secondary | ICD-10-CM | POA: Diagnosis not present

## 2021-08-01 DIAGNOSIS — E785 Hyperlipidemia, unspecified: Secondary | ICD-10-CM | POA: Diagnosis not present

## 2021-08-01 DIAGNOSIS — N401 Enlarged prostate with lower urinary tract symptoms: Secondary | ICD-10-CM | POA: Diagnosis not present

## 2021-08-04 ENCOUNTER — Ambulatory Visit (INDEPENDENT_AMBULATORY_CARE_PROVIDER_SITE_OTHER): Payer: Medicare HMO

## 2021-08-04 DIAGNOSIS — I639 Cerebral infarction, unspecified: Secondary | ICD-10-CM

## 2021-08-05 LAB — CUP PACEART REMOTE DEVICE CHECK
Date Time Interrogation Session: 20221203230736
Implantable Pulse Generator Implant Date: 20210111

## 2021-08-11 ENCOUNTER — Encounter: Payer: Self-pay | Admitting: *Deleted

## 2021-08-12 ENCOUNTER — Telehealth: Payer: Self-pay | Admitting: Neurology

## 2021-08-12 ENCOUNTER — Encounter: Payer: Self-pay | Admitting: Neurology

## 2021-08-12 ENCOUNTER — Ambulatory Visit: Payer: Medicare HMO | Admitting: Neurology

## 2021-08-12 VITALS — BP 127/89 | HR 97 | Ht 68.0 in | Wt 152.0 lb

## 2021-08-12 DIAGNOSIS — Z8673 Personal history of transient ischemic attack (TIA), and cerebral infarction without residual deficits: Secondary | ICD-10-CM

## 2021-08-12 DIAGNOSIS — G4700 Insomnia, unspecified: Secondary | ICD-10-CM | POA: Diagnosis not present

## 2021-08-12 DIAGNOSIS — G08 Intracranial and intraspinal phlebitis and thrombophlebitis: Secondary | ICD-10-CM

## 2021-08-12 DIAGNOSIS — H81399 Other peripheral vertigo, unspecified ear: Secondary | ICD-10-CM

## 2021-08-12 DIAGNOSIS — R0683 Snoring: Secondary | ICD-10-CM

## 2021-08-12 NOTE — Progress Notes (Signed)
Subjective:    Patient ID: Bryce Authement. is a 70 y.o. male.  HPI    Star Age, MD, PhD [facility] [address] [place], [place] [address]  Dear Dr. Jacelyn Grip,     I saw your patient, Bryce Logan, upon your kind request in my sleep clinic today for initial consultation of his sleep disorder, concern for vertigo.  The patient is unaccompanied today.  As you know, Bryce Logan is a 70 year old right-handed gentleman with an underlying medical history of stroke in January 2021, aortic aneurysm, history of prostatitis, coronary artery disease, depression, diverticulosis, venous sinus thrombosis, and migraine headaches, who reports a recent bout of vertigo.  Symptoms affected him in September and lasted a few days.  Years ago, approximately 4 years ago he had a similar bout but it was more severe associated with nausea and vomiting at the time and prior to that several years ago he had a severe episode of vertigo which lasted a few days.  Most recently, symptoms lasted for few days and were associated with mild nausea but no vomiting.  He has since then had resolution of his symptoms.  Some years ago he saw ENT.  He has intermittent migraines with visual auras but typically no headaches.  He has had chronic difficulty with his sleep, goes to bed around 2 AM and rise time depends on how well he slept, may be as late as noon or thereabouts.  He drinks caffeine in the form of 1 large cup of coffee around noon.  He drinks alcohol on a regular basis but typically less than 1 glass of wine per day.  He snores and has had a home sleep test in the past.  A laboratory attended sleep study was attempted several years ago but he could not sleep.  I had seen him at the request of our stroke team last year for sleep evaluation, I suggested sleep testing but he did not pursue it at the time.  He would be willing to pursue sleep testing at this time.  Previously:   02/01/20:  70 year old right-handed gentleman with an underlying medical history of depression, insomnia, hyperlipidemia, coronary artery disease, history of prostatitis, and left MCA stroke in January 2021, who reports a longstanding history of difficulty initiating and maintaining sleep.  He had a sleep study several years ago which was negative for sleep apnea.  Testing was over 25 years ago by his report.  I reviewed your office note from 01/15/2020.  His Epworth sleepiness score is 4/24, fatigue severity score is 26/63.  He attempted a sleep study another time but did not sleep or did not sleep enough.  He reports having tried multiple different medications for sleep.  He is currently using Ambien 10 mg strength and breaks the pill in half or in thirds.  He also takes melatonin at night and Xanax at night.  He reports a family history of insomnia affecting his mother.  He has previously seen a psychiatrist for his mood disorder, used to see Dr. Toy Care.  He has occasional nocturia, denies any night to night issues with that.  He has not had any recurrent morning headaches.  He has tried different antidepressants in the past but is currently not on any antidepressants, reports that they do not work for him.  He reports a bedtime between 1 and 2 AM and he sleeps with significant interruptions, rise time currently around noon.  They do not have  any pets in the house.  He lives alone, he has a girlfriend, he does not have a TV in the bedroom.  He is a non-smoker and drinks alcohol occasionally, caffeine and limitation, 1 cup/day on average.  His Past Medical History Is Significant For: Past Medical History:  Diagnosis Date   Ascending aortic aneurysm    03/02/18 stable CT asc aorta 41 mm, 42 mm previously. also note aortic atherosclerosis.  (per [facility] notes).   Chronic prostatitis    ELEVATED PSA . ED DR. DAHLSTEDT   Coronary artery disease    Depression    Diverticulosis 01/2017   in the sigmoid colon and in  the descending colon, noted on colonoscopy. repeat in 5 yrs   Dizziness 07/2010   DR. ROSEN, NORMAL EF, MILD LEAKINESS OF MITRAL VALVE, MILD STIFFNESS OF HEART MUSCLE, MILD ENLARGEMENT OF AORTIC ROOT, REPEAT IN 1 YEAR DR. Radford Pax, NL ETT DR. Radford Pax 12/11   H/O: GI bleed 2002   DR. HAYES    Hyperlipidemia    Insomnia    ON Duanne Moron, DR. MILLER   Intracerebral hematoma ([place]) 2013   [place]   Migraine headache    Mild bipolar disorder ([place]) 01/2010   DR. Arion, CHR DEPRESSION, FATIGUE OFF MEDS FOR AWHILE, CONTROLLED   Side effect of drug    side effects with statins   Stroke ([place]) 09/07/2019   per notes from [place]   Tubular adenoma 09/17/2011   [place]    His Past Surgical History Is Significant For: Past Surgical History:  Procedure Laterality Date   BUBBLE STUDY  09/12/2019   Procedure: BUBBLE STUDY;  Surgeon: [name], MD;  Location: [place];  Service: Cardiovascular;;   COLONOSCOPY  08/31/2000   IR CT HEAD LTD  09/07/2019   IR PERCUTANEOUS ART THROMBECTOMY/INFUSION INTRACRANIAL INC DIAG ANGIO  09/07/2019   IR RADIOLOGIST EVAL & MGMT  06/25/2021   L MCA angioplasty, admitted 09/07/19-09/12/19 stroke     LEFT KNEE ATHROSCOPY  08/31/2000   LEFT LEG AMBULATORY PHLEBECTOMY  08/31/2000   LOOP RECORDER INSERTION N/A 09/12/2019   Procedure: LOOP RECORDER INSERTION;  Surgeon: Thompson Grayer, MD;  Location: [place] [facility];  Service: Cardiovascular;  Laterality: N/A;   RADIOLOGY WITH ANESTHESIA N/A 09/07/2019   Procedure: RADIOLOGY WITH ANESTHESIA;  Surgeon: Luanne Bras, MD;  Location: [facility];  Service: Radiology;  Laterality: N/A;   TEE WITHOUT CARDIOVERSION N/A 09/12/2019   Procedure: TRANSESOPHAGEAL ECHOCARDIOGRAM (TEE);  Surgeon: [name], MD;  Location: [facility];  Service: Cardiovascular;  Laterality: N/A;    His Family History Is Significant For: Family History  Problem Relation Age of Onset    Hypertension Mother    Cancer - Other Mother    Hypertension Father    Heart disease Father    High Cholesterol Sister    Melanoma Sister    Hypertension Brother    Alcoholism Son    Alcohol abuse Son     His Social History Is Significant For: Social History   Socioeconomic History   Marital status: Divorced    Spouse name: Not on file   Number of children: Not on file   Years of education: Not on file   Highest education level: Not on file  Occupational History   Not on file  Tobacco Use   Smoking status: Never   Smokeless tobacco: Never  Vaping Use   Vaping Use: Not on file  Substance and Sexual  Activity   Alcohol use: Yes    Alcohol/week: 2.0 - 6.0 standard drinks    Types: 2 - 6 Glasses of wine per week    Comment: 2-6 PER WEEK   Drug use: Not Currently    Types: Marijuana    Comment: CBN oil to help him sleep   Sexual activity: Not Currently  Other Topics Concern   Not on file  Social History Narrative   Lives alone   Right handed   Caffeine: 1.5 cups/day   Social Determinants of Health   Financial Resource Strain: Not on file  Food Insecurity: Not on file  Transportation Needs: Not on file  Physical Activity: Not on file  Stress: Not on file  Social Connections: Not on file    His Allergies Are:  Allergies  Allergen Reactions   Statins     Side effects   Sulfa Antibiotics     SEVERE FATIGUE AND SIDE EFFECTS  :   His Current Medications Are:  Outpatient Encounter Medications as of 08/12/2021  Medication Sig   Acetylcysteine (NAC PO) Take 1,000-2,000 mg by mouth.   ALPRAZolam (XANAX) 0.5 MG tablet Take 1 mg by mouth at bedtime.    Ascorbic Acid (VITAMIN C) 1000 MG tablet Take 1,000 mg by mouth daily.   aspirin 81 MG EC tablet Take 81 mg by mouth daily. Swallow whole.   Betaine, Trimethylglycine, (TMG, TRIMETHYLGLYCINE, PO) Take by mouth.   Cholecalciferol (VITAMIN D3) 1.25 MG (50000 UT) CAPS Take by mouth as needed.    Coenzyme Q10 (CO Q 10)  100 MG CAPS Take 1 capsule by mouth daily.   Magnesium 400 MG CAPS Take 1 capsule by mouth daily. With a meal   Niacinamide-Zn-Cu-Methfo-Se-Cr (NICOTINAMIDE PO) Take by mouth.   NON FORMULARY LITHIUM OROTATE   pravastatin (PRAVACHOL) 10 MG tablet TAKE 1 TABLET (10 MG TOTAL) BY MOUTH EVERY EVENING FOR 90 DOSES.   Resveratrol 100 MG CAPS Take 600 mg by mouth.   tadalafil (CIALIS) 5 MG tablet Take 5 mg by mouth daily as needed for erectile dysfunction.   traZODone (DESYREL) 50 MG tablet Take 50 mg by mouth at bedtime as needed for sleep.   UNABLE TO FIND Take 650 mg by mouth. Med Name: Ashwagnandha   [DISCONTINUED] Melatonin 5 MG CAPS Take 10 mg by mouth at bedtime.   [DISCONTINUED] NON FORMULARY Take 1 tablet by mouth daily. Sam-e 400mg  daily   [DISCONTINUED] UNABLE TO FIND Med Name: Madison Hickman Supplement (Patient not taking: Reported on 08/12/2021)   [DISCONTINUED] UNABLE TO FIND Take 8 mg by mouth. Med Name: Astaxanthin (Patient not taking: Reported on 08/12/2021)   No facility-administered encounter medications on file as of 08/12/2021.  :   Review of Systems:  Out of a complete 14 point review of systems, all are reviewed and negative with the exception of these symptoms as listed below:   Review of Systems  Neurological:        Patient is here for a consult for his vertigo. He states about 2 months ago he had an episode that lasted a few days and then perhaps briefly came back but currently he is not dealing with any vertigo. He reports a history of vertigo to the point where he couldn't even move his head.   Objective:  Neurological Exam  Physical Exam Physical Examination:  Orthostatics     BP supine (x 5 minutes)    108/77 HR supine (x 5 minutes)    81 BP sitting  127/89 HR sitting    97 BP standing (after 1 minute)    125/85 HR standing (after 1 minute)    90 BP standing (after 3 minutes)    117/85 HR standing (after 3 minutes)    88 Orthostatics Comment:  denied  symptoms  General Examination: The patient is a very pleasant 70 y.o. male in no acute distress. He appears well-developed and well-nourished and well groomed.   HEENT: Normocephalic, atraumatic, pupils are equal, round and reactive to light, extraocular tracking is good without limitation to gaze excursion or nystagmus noted. Hearing is grossly intact. Face is symmetric with normal facial animation.  Speech is mildly dysarthric at times, no vertiginous symptoms reported. No carotid bruits.  Airway examination reveals mild mouth dryness, Mallampati class II, mild airway crowding secondary to small airway and redundant soft palate, tonsils are absent. Tongue protrudes centrally and palate elevates symmetrically.     Chest: Clear to auscultation without wheezing, rhonchi or crackles noted.   Heart: S1+S2+0, regular and normal without murmurs, rubs or gallops noted.    Abdomen: Soft, non-tender and non-distended.   Extremities: There is no pitting edema in the distal lower extremities bilaterally.    Skin: Warm and dry without trophic changes noted.    Musculoskeletal: exam reveals no obvious joint deformities.    Neurologically:  Mental status: The patient is awake, alert and oriented in all 4 spheres. His immediate and remote memory, attention, language skills and fund of knowledge are appropriate. There is no evidence of aphasia, agnosia, apraxia or anomia. Speech is clear with normal prosody and enunciation. Thought process is linear. Mood is normal and affect is normal.  Cranial nerves II - XII are as described above under HEENT exam.  Motor exam: Normal bulk, strength and tone is noted. There is no tremor, fine motor skills and coordination: grossly intact.  Cerebellar testing: No dysmetria or intention tremor. There is no truncal or gait ataxia.  Sensory exam: intact to light touch in the upper and lower extremities.  Gait, station and balance: He stands easily. No veering to one side is  noted. No leaning to one side is noted. Posture is age-appropriate and stance is narrow based. Gait shows normal stride length and normal pace. No problems turning are noted.    Assessment and Plan:     In summary, Bryce Logan. is a very pleasant 70 year old male with an underlying medical history of depression, insomnia, hyperlipidemia, coronary artery disease, history of prostatitis, history of venous sinus thrombosis, and left MCA stroke in January 2021, who presents for evaluation of his vertigo.  He has had episodic vertigo for years, he recently had a bout in September which resolved after a few days.  He feels symptom-free at this time.  He is advised to follow-up with his specialist as scheduled, he is advised to follow-up to see his stroke specialist/primary neurologist in the next 2 to 3 months.  As far as his sleep disturbance, he is agreeable to pursuing a home sleep test as he was requested to have a sleep study back in June of last year, after his stroke.  Unfortunately, his chronic difficulty maintaining sleep dates back to the 90s.  However, given his history of stroke, I would recommend that we pursue sleep testing at this time. We will consider AutoPap therapy if he has obstructive sleep apnea, he would be willing to try treatment for sleep apnea.  We will pick up our discussion after testing. I  answered all his questions today and he was in agreement with the plan.   I spent 40 minutes in total face-to-face time and in reviewing records during pre-charting, more than 50% of which was spent in counseling and coordination of care, reviewing test results, reviewing medications and treatment regimen and/or in discussing or reviewing the diagnosis of vertigo, sleep disturbance, the prognosis and treatment options. Pertinent laboratory and imaging test results that were available during this visit with the patient were reviewed by me and considered in my medical decision making (see chart for  details).

## 2021-08-12 NOTE — Telephone Encounter (Signed)
He does have hx of BPPV - not sure if this is what he is experiencing. He was previously seen for stroke follow-up 1 year ago and released from stroke clinic as he has been stable. It would have been helpful to have this addressed today (even though not sleep related but this is also not stroke related) as myself nor Dr. Leonie Man have any openings anytime soon. If this is his typical BPPV, PCP can refer to vestibular rehab during the interval time.

## 2021-08-12 NOTE — Telephone Encounter (Signed)
After 12/13 visit with Dr. Rexene Alberts pt came to check out and stated he needed a follow-up appt with Dr. Leonie Man. He has only been seen at our office before by [facility] and Dr. Rexene Alberts. There is currently not a referral ordered for him to see Dr. Leonie Man, I told him we would look into this and give him a call. Please advise.

## 2021-08-12 NOTE — Telephone Encounter (Signed)
To help clarify: I saw the patient in sleep consultation last year at the request of the stroke team.   The patient has been followed by the stroke team since early last year and Dr. Leonie Man is his primary neurologist, well aware of his history since beginning of 2021.  Patient is advised to follow-up with his primary neurologist at this time.  I am happy to continue to follow him for sleep sleep apnea after testing.  I am copying Janett Billow and Dr. Leonie Man on this as well, so we are all on the same page.

## 2021-08-12 NOTE — Patient Instructions (Addendum)
°  It was nice to see you again today.  As discussed, we will proceed with a home sleep test.  You likely have episodic vertigo.  I am glad to hear that your symptoms are better.

## 2021-08-12 NOTE — Telephone Encounter (Signed)
Dr. Krista Blue and Dr. Leta Baptist- please advise.

## 2021-08-13 DIAGNOSIS — H52223 Regular astigmatism, bilateral: Secondary | ICD-10-CM | POA: Diagnosis not present

## 2021-08-13 DIAGNOSIS — H524 Presbyopia: Secondary | ICD-10-CM | POA: Diagnosis not present

## 2021-08-13 DIAGNOSIS — H5200 Hypermetropia, unspecified eye: Secondary | ICD-10-CM | POA: Diagnosis not present

## 2021-08-13 NOTE — Progress Notes (Signed)
Carelink Summary Report / Loop Recorder 

## 2021-08-28 ENCOUNTER — Telehealth: Payer: Self-pay | Admitting: Neurology

## 2021-08-28 NOTE — Telephone Encounter (Signed)
LVM for pt to call me back to schedule sleep study  

## 2021-09-04 ENCOUNTER — Telehealth: Payer: Self-pay | Admitting: Neurology

## 2021-09-04 NOTE — Telephone Encounter (Signed)
LVM for pt to call me back to schedule sleep study  

## 2021-09-08 ENCOUNTER — Ambulatory Visit (INDEPENDENT_AMBULATORY_CARE_PROVIDER_SITE_OTHER): Payer: Medicare HMO

## 2021-09-08 DIAGNOSIS — I639 Cerebral infarction, unspecified: Secondary | ICD-10-CM | POA: Diagnosis not present

## 2021-09-08 LAB — CUP PACEART REMOTE DEVICE CHECK
Date Time Interrogation Session: 20230108231340
Implantable Pulse Generator Implant Date: 20210111

## 2021-09-16 ENCOUNTER — Telehealth: Payer: Self-pay | Admitting: Neurology

## 2021-09-16 NOTE — Progress Notes (Signed)
Carelink Summary Report / Loop Recorder 

## 2021-09-16 NOTE — Telephone Encounter (Signed)
We have attempted to call the patient 2 times to schedule sleep study. Patient has been unavailable at the phone numbers we have on file and has not returned our calls. If patient calls back we will schedule them for their sleep study. ° °

## 2021-09-23 ENCOUNTER — Other Ambulatory Visit: Payer: Self-pay

## 2021-09-23 ENCOUNTER — Encounter (HOSPITAL_BASED_OUTPATIENT_CLINIC_OR_DEPARTMENT_OTHER): Payer: Self-pay

## 2021-09-23 DIAGNOSIS — R531 Weakness: Secondary | ICD-10-CM | POA: Insufficient documentation

## 2021-09-23 DIAGNOSIS — R0602 Shortness of breath: Secondary | ICD-10-CM | POA: Insufficient documentation

## 2021-09-23 DIAGNOSIS — Z20822 Contact with and (suspected) exposure to covid-19: Secondary | ICD-10-CM | POA: Insufficient documentation

## 2021-09-23 DIAGNOSIS — R42 Dizziness and giddiness: Secondary | ICD-10-CM | POA: Diagnosis not present

## 2021-09-23 DIAGNOSIS — Z79899 Other long term (current) drug therapy: Secondary | ICD-10-CM | POA: Diagnosis not present

## 2021-09-23 DIAGNOSIS — R5383 Other fatigue: Secondary | ICD-10-CM | POA: Insufficient documentation

## 2021-09-23 DIAGNOSIS — Z7982 Long term (current) use of aspirin: Secondary | ICD-10-CM | POA: Diagnosis not present

## 2021-09-23 DIAGNOSIS — I2510 Atherosclerotic heart disease of native coronary artery without angina pectoris: Secondary | ICD-10-CM | POA: Diagnosis not present

## 2021-09-23 LAB — URINALYSIS, ROUTINE W REFLEX MICROSCOPIC
Bilirubin Urine: NEGATIVE
Glucose, UA: NEGATIVE mg/dL
Hgb urine dipstick: NEGATIVE
Ketones, ur: NEGATIVE mg/dL
Leukocytes,Ua: NEGATIVE
Nitrite: NEGATIVE
Protein, ur: NEGATIVE mg/dL
Specific Gravity, Urine: 1.005 (ref 1.005–1.030)
pH: 6 (ref 5.0–8.0)

## 2021-09-23 LAB — CBC
HCT: 45.1 % (ref 39.0–52.0)
Hemoglobin: 15 g/dL (ref 13.0–17.0)
MCH: 31.1 pg (ref 26.0–34.0)
MCHC: 33.3 g/dL (ref 30.0–36.0)
MCV: 93.4 fL (ref 80.0–100.0)
Platelets: 188 10*3/uL (ref 150–400)
RBC: 4.83 MIL/uL (ref 4.22–5.81)
RDW: 12.4 % (ref 11.5–15.5)
WBC: 6 10*3/uL (ref 4.0–10.5)
nRBC: 0 % (ref 0.0–0.2)

## 2021-09-23 LAB — CBG MONITORING, ED: Glucose-Capillary: 94 mg/dL (ref 70–99)

## 2021-09-23 LAB — BASIC METABOLIC PANEL
Anion gap: 9 (ref 5–15)
BUN: 16 mg/dL (ref 8–23)
CO2: 26 mmol/L (ref 22–32)
Calcium: 9.4 mg/dL (ref 8.9–10.3)
Chloride: 105 mmol/L (ref 98–111)
Creatinine, Ser: 1.22 mg/dL (ref 0.61–1.24)
GFR, Estimated: >60 mL/min (ref >60)
Glucose, Bld: 110 mg/dL — ABNORMAL HIGH (ref 70–99)
Potassium: 3.7 mmol/L (ref 3.5–5.1)
Sodium: 140 mmol/L (ref 135–145)

## 2021-09-23 LAB — RESP PANEL BY RT-PCR (FLU A&B, COVID) ARPGX2
Influenza A by PCR: NEGATIVE
Influenza B by PCR: NEGATIVE
SARS Coronavirus 2 by RT PCR: NEGATIVE

## 2021-09-23 NOTE — ED Triage Notes (Signed)
Patient here POV from Home for Weakness.  Patient endorses Weakness, Dizziness with Sudden Acute Onset approximately 1-2 hours PTA.  No Pain. Mild SOB. 81 mg ASA Daily. Gross Neurological Exam is Normal in Triage.  NAD Noted during Triage. A&Ox4. GCS 15. Ambulatory.

## 2021-09-24 ENCOUNTER — Telehealth: Payer: Self-pay

## 2021-09-24 ENCOUNTER — Emergency Department (HOSPITAL_BASED_OUTPATIENT_CLINIC_OR_DEPARTMENT_OTHER)
Admission: EM | Admit: 2021-09-24 | Discharge: 2021-09-24 | Disposition: A | Discharge status: Home / Self Care | Payer: Medicare HMO | Attending: Emergency Medicine | Admitting: Emergency Medicine

## 2021-09-24 DIAGNOSIS — R531 Weakness: Secondary | ICD-10-CM

## 2021-09-24 DIAGNOSIS — R42 Dizziness and giddiness: Secondary | ICD-10-CM

## 2021-09-24 LAB — TROPONIN I (HIGH SENSITIVITY): Troponin I (High Sensitivity): 3 ng/L (ref <18)

## 2021-09-24 NOTE — Telephone Encounter (Signed)
Pt left a message stating he had an episode last night lightheadness, faintness, and dizziness. He went to the ER and was sent home. He would like for the nurse to look at his transmission and give him a call back. His phone number is [phone].

## 2021-09-24 NOTE — Telephone Encounter (Signed)
Patient is returning RN's call.

## 2021-09-24 NOTE — Telephone Encounter (Signed)
Transmission reviewed. No episodes noted. Unsuccessful telephone encounter to patient to discuss. [employer] compliant VM message left requesting callback to [phone].

## 2021-09-24 NOTE — ED Provider Notes (Signed)
[facility] EMERGENCY DEPT Provider Note  CSN: [id number] Arrival date & time: 09/23/21 2241  Chief Complaint(s) Weakness  HPI Bryce Logan. is a 71 y.o. male with extensive past medical history listed below including prior cryptogenic stroke, hyperlipidemia, who presents to the emergency department with generalized fatigue.  This began earlier in the day and is gradually worsened.  This evening while sitting down to eat it suddenly became more intense and he had lightheadedness.  He denied any visual disturbance, focal deficits.  No associated chest pain.  In triage the reported shortness of breath which he described as a "sigh."   He denies any fevers or chills.  No coughing or congestion.  No known sick contacts.  No nausea vomiting.  He does report he has been dealing with insomnia for the past several weeks and has been taking trazodone, Xanax and CBD/CBN/delta 8 edibles to help him sleep.  He denies taking any tonight.  I was pulled into the triage given the patient's history and symptoms to determine whether a code stroke would be required.  I obtained a temperature noted 99 degrees orally.   Weakness  Past Medical History Past Medical History:  Diagnosis Date   Ascending aortic aneurysm    03/02/18 stable CT asc aorta 41 mm, 42 mm previously. also note aortic atherosclerosis.  (per [facility] notes).   Chronic prostatitis    ELEVATED PSA . ED DR. DAHLSTEDT   Coronary artery disease    Depression    Diverticulosis 01/2017   in the sigmoid colon and in the descending colon, noted on colonoscopy. repeat in 5 yrs   Dizziness 07/2010   DR. ROSEN, NORMAL EF, MILD LEAKINESS OF MITRAL VALVE, MILD STIFFNESS OF HEART MUSCLE, MILD ENLARGEMENT OF AORTIC ROOT, REPEAT IN 1 YEAR DR. Radford Pax, NL ETT DR. Radford Pax 12/11   H/O: GI bleed 2002   DR. HAYES    Hyperlipidemia    Insomnia    ON Duanne Moron, DR. MILLER   Intracerebral hematoma ([place]) 2013   [place]    Migraine headache    Mild bipolar disorder ([place]) 01/2010   DR. PITTMAN DR. [place], CHR DEPRESSION, FATIGUE OFF MEDS FOR AWHILE, CONTROLLED   Side effect of drug    side effects with statins   Stroke ([place]) 09/07/2019   per notes from [place]   Tubular adenoma 09/17/2011   [place]   Patient Active Problem List   Diagnosis Date Noted   Possible Dilatation of aortic sinus of Valsalva 09/12/2019   Expressive aphasia - Aphemia 09/08/2019   Cryptogenic stroke (HCC) L MCA s/p tPA and IR 09/07/2019   Middle cerebral artery embolism, left 09/07/2019   Dyspnea 02/03/2019   Chest pain 02/03/2019   CAD (coronary artery disease) 02/03/2019   Thoracic aortic aneurysm 02/03/2019   Hyperlipidemia 02/03/2019   Home Medication(s) Prior to Admission medications   Medication Sig Start Date End Date Taking? Authorizing Provider  Acetylcysteine (NAC PO) Take 1,000-2,000 mg by mouth.    [provider]  ALPRAZolam Duanne Moron) 0.5 MG tablet Take 1 mg by mouth at bedtime.  09/21/16   [provider]  Ascorbic Acid (VITAMIN C) 1000 MG tablet Take 1,000 mg by mouth daily.    [provider]  aspirin 81 MG EC tablet Take 81 mg by mouth daily. Swallow whole.    [provider]  Betaine, Trimethylglycine, (TMG, TRIMETHYLGLYCINE, PO) Take by mouth.    [provider]  Cholecalciferol (VITAMIN D3)  1.25 MG (50000 UT) CAPS Take by mouth as needed.     [provider]  Coenzyme Q10 (CO Q 10) 100 MG CAPS Take 1 capsule by mouth daily.    [provider]  Magnesium 400 MG CAPS Take 1 capsule by mouth daily. With a meal    [provider]  Niacinamide-Zn-Cu-Methfo-Se-Cr (NICOTINAMIDE PO) Take by mouth.    [provider]  NON FORMULARY LITHIUM OROTATE    [provider]  pravastatin (PRAVACHOL) 10 MG tablet TAKE 1 TABLET (10 MG TOTAL) BY MOUTH EVERY EVENING FOR 90 DOSES. 06/13/21 09/11/21  Geralynn Rile,  MD  Resveratrol 100 MG CAPS Take 600 mg by mouth.    [provider]  tadalafil (CIALIS) 5 MG tablet Take 5 mg by mouth daily as needed for erectile dysfunction.    [provider]  traZODone (DESYREL) 50 MG tablet Take 50 mg by mouth at bedtime as needed for sleep.    [provider]  UNABLE TO FIND Take 650 mg by mouth. Med Name: Ashwagnandha    [provider]                                                                                                                                    Allergies Statins and Sulfa antibiotics  Review of Systems Review of Systems  Neurological:  Positive for weakness.  As noted in HPI  Physical Exam Vital Signs  I have reviewed the triage vital signs BP 127/80    Pulse 64    Temp 97.7 F (36.5 C)    Resp 18    Ht 5\' 8"  (1.727 m)    Wt 68 kg    SpO2 99%    BMI 22.81 kg/m   Physical Exam Vitals reviewed.  Constitutional:      General: He is not in acute distress.    Appearance: He is well-developed. He is not diaphoretic.  HENT:     Head: Normocephalic and atraumatic.     Nose: Nose normal.  Eyes:     General: No scleral icterus.       Right eye: No discharge.        Left eye: No discharge.     Conjunctiva/sclera: Conjunctivae normal.     Pupils: Pupils are equal, round, and reactive to light.  Cardiovascular:     Rate and Rhythm: Normal rate and regular rhythm.     Heart sounds: No murmur heard.   No friction rub. No gallop.  Pulmonary:     Effort: Pulmonary effort is normal. No respiratory distress.     Breath sounds: Normal breath sounds. No stridor. No rales.  Abdominal:     General: There is no distension.     Palpations: Abdomen is soft.     Tenderness: There is no abdominal tenderness.  Musculoskeletal:        General: No tenderness.  Cervical back: Normal range of motion and neck supple.  Skin:    General: Skin is warm and dry.     Findings: No erythema or rash.  Neurological:      Mental Status: He is alert and oriented to person, place, and time.     Comments: Mental Status:  Alert and oriented to person, place, and time.  Attention and concentration normal.  Speech clear.  Recent memory is intact  Cranial Nerves:  II Visual Fields: Intact to confrontation. Visual fields intact. III, IV, VI: Pupils equal and reactive to light and near. Full eye movement without nystagmus  V Facial Sensation: Normal. No weakness of masticatory muscles  VII: No facial weakness or asymmetry  VIII Auditory Acuity: Grossly normal  IX/X: The uvula is midline; the palate elevates symmetrically  XI: Normal sternocleidomastoid and trapezius strength  XII: The tongue is midline. No atrophy or fasciculations.   Motor System: Muscle Strength: 5/5 and symmetric in the upper and lower extremities. No pronation or drift.  Muscle Tone: Tone and muscle bulk are normal in the upper and lower extremities.  Reflexes: DTRs: 1+ and symmetrical in all four extremities. No Clonus Coordination: Intact finger-to-nose, heel-to-shin. No tremor.  Sensation: Intact to light touch, and pinprick. Negative Romberg test.  Gait: Routine gait normal.     ED Results and Treatments Labs (all labs ordered are listed, but only abnormal results are displayed) Labs Reviewed  BASIC METABOLIC PANEL - Abnormal; Notable for the following components:      Result Value   Glucose, Bld 110 (*)    All other components within normal limits  URINALYSIS, ROUTINE W REFLEX MICROSCOPIC - Abnormal; Notable for the following components:   Color, Urine COLORLESS (*)    All other components within normal limits  RESP PANEL BY RT-PCR (FLU A&B, COVID) ARPGX2  CBC  CBG MONITORING, ED  TROPONIN I (HIGH SENSITIVITY)                                                                                                                         EKG  EKG Interpretation  Date/Time:  Tuesday September 23 2021 22:59:05 EST Ventricular Rate:   72 PR Interval:  136 QRS Duration: 80 QT Interval:  372 QTC Calculation: 407 R Axis:   63 Text Interpretation: Normal sinus rhythm Normal ECG motion artifact in V1 Otherwise no significant change When compared with ECG of 21-Mar-2001 17:21, No significant change was found Confirmed by Addison Lank ([id number]) on 09/24/2021 12:22:46 AM       Radiology No results found.  Pertinent labs & imaging results that were available during my care of the patient were reviewed by me and considered in my medical decision making (see MDM for details).  Medications Ordered in ED Medications - No data to display  Procedures Procedures  (including critical care time)  Medical Decision Making / ED Course        Gradual onset generalized fatigue  Suddenly worse while eating. No focal deficits, other symptoms concerning for CVA.  Neurologic exam nonfocal. Patient denied any chest pain or shortness of breath.  Doubt cardiac process but given patient's past medical history will obtain a cardiac work-up Low suspicion for pulmonary embolism, dissection.   Work-up ordered to assess concerns above.  Labs and independently interpreted by me and noted below: EKG without acute ischemic changes, dysrhythmias or blocks. Troponin 4 hours after the incident negative. CBC without leukocytosis or anemia. No significant electrolyte derangements or renal sufficiency. COVID/influenza negative  Management: Monitored for 3 hours  Reassessment: Stable without any acute changes Discussed with the patient option for getting brain imaging with the patient, but with shared decision making, we opted to defer.     Final Clinical Impression(s) / ED Diagnoses Final diagnoses:  Generalized weakness  Lightheaded   The patient appears reasonably screened and/or stabilized for discharge  and I doubt any other medical condition or other [facility] requiring further screening, evaluation, or treatment in the ED at this time prior to discharge. Safe for discharge with strict return precautions.  Disposition: Discharge  Condition: Good  I have discussed the results, Dx and Tx plan with the patient/family who expressed understanding and agree(s) with the plan. Discharge instructions discussed at length. The patient/family was given strict return precautions who verbalized understanding of the instructions. No further questions at time of discharge.    ED Discharge Orders     None        Follow Up: Kathyrn Lass, MD [place] [id number] [phone]  Call  to schedule an appointment for close follow up           This chart was dictated using voice recognition software.  Despite best efforts to proofread,  errors can occur which can change the documentation meaning.    Fatima Blank, MD 09/24/21 [phone]

## 2021-09-24 NOTE — ED Notes (Signed)
Pt verbalizes understanding of discharge instructions. Opportunity for questioning and answers were provided. Pt discharged from ED to home.   ? ?

## 2021-09-24 NOTE — Telephone Encounter (Signed)
Attempted to contact patient. No answer. Per DPR, [facility] may leave a detailed VM message. Informed patient that Loop transmission was normal and no episodes noted within programmed parameters. Advised patient if he had additional questions or concerns to contact device clinic at [phone].

## 2021-09-25 DIAGNOSIS — Z8673 Personal history of transient ischemic attack (TIA), and cerebral infarction without residual deficits: Secondary | ICD-10-CM | POA: Diagnosis not present

## 2021-09-25 DIAGNOSIS — G4700 Insomnia, unspecified: Secondary | ICD-10-CM | POA: Diagnosis not present

## 2021-09-25 DIAGNOSIS — G479 Sleep disorder, unspecified: Secondary | ICD-10-CM | POA: Diagnosis not present

## 2021-10-11 LAB — CUP PACEART REMOTE DEVICE CHECK
Date Time Interrogation Session: 20230210230756
Implantable Pulse Generator Implant Date: 20210111

## 2021-10-13 ENCOUNTER — Ambulatory Visit (INDEPENDENT_AMBULATORY_CARE_PROVIDER_SITE_OTHER): Payer: Medicare HMO

## 2021-10-13 DIAGNOSIS — I639 Cerebral infarction, unspecified: Secondary | ICD-10-CM

## 2021-10-15 DIAGNOSIS — Z8659 Personal history of other mental and behavioral disorders: Secondary | ICD-10-CM | POA: Diagnosis not present

## 2021-10-15 DIAGNOSIS — G4700 Insomnia, unspecified: Secondary | ICD-10-CM | POA: Diagnosis not present

## 2021-10-15 DIAGNOSIS — Z8673 Personal history of transient ischemic attack (TIA), and cerebral infarction without residual deficits: Secondary | ICD-10-CM | POA: Diagnosis not present

## 2021-10-15 DIAGNOSIS — R0683 Snoring: Secondary | ICD-10-CM | POA: Diagnosis not present

## 2021-10-15 NOTE — Progress Notes (Signed)
Cardiology Office Note:   Date:  10/16/2021  NAME:  Bryce Logan.    MRN: [id number] DOB:  February 20, 1951   PCP:  Kathyrn Lass, MD  Cardiologist:  Evalina Field, MD  Electrophysiologist:  None   Referring MD: Kathyrn Lass, MD   Chief Complaint  Patient presents with   Follow-up        History of Present Illness:   Bryce Logan. is a 70 y.o. male with a hx of cryptogenic stroke, HLD who presents for follow-up.  He reports he is doing well.  Had an episode of fatigue.  Seen in the emergency room.  Work-up was negative.  He denies any chest pain or trouble breathing.  He is walking 4 to 5 days/week.  40 minutes per session.  No chest pain or trouble breathing.  Most recent cholesterol level was not at goal.  He reports no symptoms.  Seems to be doing quite well.  We discussed his LDL cholesterol.  Not at goal.  Only able to tolerate pravastatin 10 mg daily.  We discussed Zetia.  He is okay to try this.  We also discussed his aorta.  Values have been stable.  We will recheck a CT scan to make sure everything has been unchanged.  He is okay to do this.  No A-fib documented on his loop recorder interrogation.  Problem List 1. Cryptogenic Stroke -L MCA s/p tPA 09/2019 -Negative TEE for PFO/LAA thrombus  -mild R ICA plaque/no stenosis L ICA -Total cholesterol 190, HDL 74, LDL 107, triglycerides 70 -A1c 5.4 -loop recorder in place (no afib found) 2. Dilated SoV -41 mm CT chest wo contrast 2019 -40 mm root TTE 09/08/2019 3. R transverse sinus thrombosis  Past Medical History: Past Medical History:  Diagnosis Date   Ascending aortic aneurysm    03/02/18 stable CT asc aorta 41 mm, 42 mm previously. also note aortic atherosclerosis.  (per [facility] notes).   Chronic prostatitis    ELEVATED PSA . ED DR. DAHLSTEDT   Coronary artery disease    Depression    Diverticulosis 01/2017   in the sigmoid colon and in the descending colon, noted on colonoscopy. repeat in 5 yrs   Dizziness  07/2010   DR. ROSEN, NORMAL EF, MILD LEAKINESS OF MITRAL VALVE, MILD STIFFNESS OF HEART MUSCLE, MILD ENLARGEMENT OF AORTIC ROOT, REPEAT IN 1 YEAR DR. Radford Pax, NL ETT DR. Radford Pax 12/11   H/O: GI bleed 2002   DR. HAYES    Hyperlipidemia    Insomnia    ON Duanne Moron, DR. MILLER   Intracerebral hematoma ([place]) 2013   [place]   Migraine headache    Mild bipolar disorder ([place]) 01/2010   DR. [place], CHR DEPRESSION, FATIGUE OFF MEDS FOR AWHILE, CONTROLLED   Side effect of drug    side effects with statins   Stroke ([place]) 09/07/2019   per notes from [place]   Tubular adenoma 09/17/2011   [place]    Past Surgical History: Past Surgical History:  Procedure Laterality Date   BUBBLE STUDY  09/12/2019   Procedure: BUBBLE STUDY;  Surgeon: [name], MD;  Location: [facility];  Service: Cardiovascular;;   COLONOSCOPY  08/31/2000   IR CT HEAD LTD  09/07/2019   IR PERCUTANEOUS ART THROMBECTOMY/INFUSION INTRACRANIAL INC DIAG ANGIO  09/07/2019   IR RADIOLOGIST EVAL & MGMT  06/25/2021   L MCA angioplasty, admitted 09/07/19-09/12/19 stroke     LEFT KNEE ATHROSCOPY  08/31/2000   LEFT LEG AMBULATORY PHLEBECTOMY  08/31/2000   LOOP RECORDER INSERTION N/A 09/12/2019   Procedure: LOOP RECORDER INSERTION;  Surgeon: Thompson Grayer, MD;  Location: [facility] CV LAB;  Service: Cardiovascular;  Laterality: N/A;   RADIOLOGY WITH ANESTHESIA N/A 09/07/2019   Procedure: RADIOLOGY WITH ANESTHESIA;  Surgeon: Luanne Bras, MD;  Location: [facility];  Service: Radiology;  Laterality: N/A;   TEE WITHOUT CARDIOVERSION N/A 09/12/2019   Procedure: TRANSESOPHAGEAL ECHOCARDIOGRAM (TEE);  Surgeon: [name], MD;  Location: [facility] ENDOSCOPY;  Service: Cardiovascular;  Laterality: N/A;    Current Medications: Current Meds  Medication Sig   Acetylcysteine (NAC PO) Take 1,000-2,000 mg by mouth.   ALPRAZolam (XANAX) 0.5 MG tablet Take 1 mg by mouth at bedtime.     Ascorbic Acid (VITAMIN C) 1000 MG tablet Take 1,000 mg by mouth daily.   aspirin 81 MG EC tablet Take 81 mg by mouth daily. Swallow whole.   Betaine, Trimethylglycine, (TMG, TRIMETHYLGLYCINE, PO) Take by mouth.   Cholecalciferol (VITAMIN D3) 1.25 MG (50000 UT) CAPS Take by mouth as needed.    Coenzyme Q10 (CO Q 10) 100 MG CAPS Take 1 capsule by mouth daily.   ezetimibe (ZETIA) 10 MG tablet Take 1 tablet (10 mg total) by mouth daily.   Magnesium 400 MG CAPS Take 1 capsule by mouth daily. With a meal   Niacinamide-Zn-Cu-Methfo-Se-Cr (NICOTINAMIDE PO) Take by mouth.   NON FORMULARY LITHIUM OROTATE   Resveratrol 100 MG CAPS Take 600 mg by mouth.   tadalafil (CIALIS) 5 MG tablet Take 5 mg by mouth daily as needed for erectile dysfunction.   traZODone (DESYREL) 50 MG tablet Take 50 mg by mouth at bedtime as needed for sleep.   UNABLE TO FIND Take 650 mg by mouth. Med Name: Ashwagnandha     Allergies:    Statins and Sulfa antibiotics   Social History: Social History   Socioeconomic History   Marital status: Divorced    Spouse name: Not on file   Number of children: Not on file   Years of education: Not on file   Highest education level: Not on file  Occupational History   Not on file  Tobacco Use   Smoking status: Never   Smokeless tobacco: Never  Vaping Use   Vaping Use: Not on file  Substance and Sexual Activity   Alcohol use: Yes    Alcohol/week: 2.0 - 6.0 standard drinks    Types: 2 - 6 Glasses of wine per week    Comment: 2-6 PER WEEK   Drug use: Not Currently    Types: Marijuana    Comment: CBN oil to help him sleep   Sexual activity: Not Currently  Other Topics Concern   Not on file  Social History Narrative   Lives alone   Right handed   Caffeine: 1.5 cups/day   Social Determinants of Health   Financial Resource Strain: Not on file  Food Insecurity: Not on file  Transportation Needs: Not on file  Physical Activity: Not on file  Stress: Not on file  Social  Connections: Not on file     Family History: The patient's family history includes Alcohol abuse in his son; Alcoholism in his son; Cancer - Other in his mother; Heart disease in his father; High Cholesterol in his sister; Hypertension in his brother, father, and mother; Melanoma in his sister.  ROS:   All other ROS reviewed and negative. Pertinent positives noted in the HPI.  EKGs/Labs/Other Studies Reviewed:   The following studies were personally reviewed by me today:  TEE 09/12/2019  1. Left ventricular ejection fraction, by visual estimation, is 55 to  60%. The left ventricle has normal function. There is no left ventricular  hypertrophy.   2. The left ventricle has no regional wall motion abnormalities.   3. Global right ventricle has normal systolic function.The right  ventricular size is normal. No increase in right ventricular wall  thickness.   4. Left atrial size was normal.   5. Right atrial size was normal.   6. The mitral valve is grossly normal. Trivial mitral valve  regurgitation.   7. The tricuspid valve is grossly normal.   8. The aortic valve is tricuspid. Aortic valve regurgitation is not  visualized.   9. The pulmonic valve was grossly normal. Pulmonic valve regurgitation is  not visualized.  10. 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  11. Aortic dilatation noted.  12. No intracardiac thrombi or masses were visualized.   Recent Labs: 09/23/2021: BUN 16; Creatinine, Ser 1.22; Hemoglobin 15.0; Platelets 188; Potassium 3.7; Sodium 140   Recent Lipid Panel    Component Value Date/Time   CHOL 143 11/30/2019 1455   TRIG 84 11/30/2019 1455   HDL 65 11/30/2019 1455   CHOLHDL 2.2 11/30/2019 1455   CHOLHDL 2.8 09/08/2019 0418   VLDL 13 09/08/2019 0418   LDLCALC 62 11/30/2019 1455    Physical Exam:   VS:  BP 126/74    Pulse 93    Ht 5\' 8"  (1.727 m)    Wt 149 lb 6.4 oz (67.8 kg)    SpO2 99%    BMI 22.72 kg/m    Wt Readings from Last 3  Encounters:  10/16/21 149 lb 6.4 oz (67.8 kg)  09/23/21 150 lb (68 kg)  08/12/21 152 lb (68.9 kg)    General: Well nourished, well developed, in no acute distress Head: Atraumatic, normal size  Eyes: PEERLA, EOMI  Neck: Supple, no JVD Endocrine: No thryomegaly Cardiac: Normal S1, S2; RRR; no murmurs, rubs, or gallops Lungs: Clear to auscultation bilaterally, no wheezing, rhonchi or rales  Abd: Soft, nontender, no hepatomegaly  Ext: No edema, pulses 2+ Musculoskeletal: No deformities, BUE and BLE strength normal and equal Skin: Warm and dry, no rashes   Neuro: Alert and oriented to person, place, time, and situation, CNII-XII grossly intact, no focal deficits  Psych: Normal mood and affect   ASSESSMENT:   Alaric Gladwin. is a 71 y.o. male who presents for the following: 1. Cryptogenic stroke ([place]) L MCA s/p tPA and IR   2. Dilated aortic root ([place])   3. Aortic ectasia (HCC)   4. Mixed hyperlipidemia     PLAN:   1. Cryptogenic stroke ([place]) L MCA s/p tPA and IR -History of stroke in the past.  A-fib not found on monitor.  No real etiology discovered.  He will continue with surveillance on his loop recorder. -Would recommend to continue aspirin 81 mg daily.  He is on pravastatin 10 mg daily.  Not at goal.  We will add Zetia.  He will come back in 3 months for repeat labs.  2. Dilated aortic root ([place]) 3. Aortic ectasia (HCC) -Aorta 40 to 41 mm.  We will recheck a CT noncontrast.  If it is stable we will forego further testing given his age.  4. Mixed hyperlipidemia -Add Zetia 10 mg daily.  Only able to tolerate Pravachol 10  mg daily.  LDL cholesterol goal less than 70.  Disposition: Return in about 1 year (around 10/16/2022).  Medication Adjustments/Labs and Tests Ordered: Current medicines are reviewed at length with the patient today.  Concerns regarding medicines are outlined above.  Orders Placed This Encounter  Procedures   CT Chest Wo Contrast   Lipid panel   Meds  ordered this encounter  Medications   ezetimibe (ZETIA) 10 MG tablet    Sig: Take 1 tablet (10 mg total) by mouth daily.    Dispense:  90 tablet    Refill:  3    Patient Instructions  Medication Instructions:  Start Zetia 10 mg daily   *If you need a refill on your cardiac medications before your next appointment, please call your pharmacy*   Lab Work: LIPID (1 week before appointment in 3 months)   If you have labs (blood work) drawn today and your tests are completely normal, you will receive your results only by: [place] (if you have MyChart) OR A paper copy in the mail If you have any lab test that is abnormal or we need to change your treatment, we will call you to review the results.   Testing/Procedures: Non Contrast Chest CT.   Follow-Up: At [facility], you and your health needs are our priority.  As part of our continuing mission to provide you with exceptional heart care, we have created designated Provider Care Teams.  These Care Teams include your primary Cardiologist (physician) and Advanced Practice Providers (APPs -  Physician Assistants and Nurse Practitioners) who all work together to provide you with the care you need, when you need it.  We recommend signing up for the patient portal called "MyChart".  Sign up information is provided on this After Visit Summary.  MyChart is used to connect with patients for Virtual Visits (Telemedicine).  Patients are able to view lab/test results, encounter notes, upcoming appointments, etc.  Non-urgent messages can be sent to your provider as well.   To learn more about what you can do with MyChart, go to [email or web address].    Your next appointment:   3 month(s)  The format for your next appointment:   In Person  Provider:   Sande Rives, PA-C, Kennith Maes, or Almyra Deforest, PA-C    Then, Evalina Field, MD will plan to see you again in 12 month(s).      Time Spent with Patient: I have  spent a total of 35 minutes with patient reviewing hospital notes, telemetry, EKGs, labs and examining the patient as well as establishing an assessment and plan that was discussed with the patient.  > 50% of time was spent in direct patient care.  Signed, Addison Naegeli. Audie Box, MD, [place]  [address], [place], [place] [address] [phone]  10/16/2021 3:18 PM

## 2021-10-15 NOTE — Progress Notes (Signed)
Carelink Summary Report / Loop Recorder 

## 2021-10-16 ENCOUNTER — Encounter: Payer: Self-pay | Admitting: Cardiovascular Disease

## 2021-10-16 ENCOUNTER — Other Ambulatory Visit: Payer: Self-pay

## 2021-10-16 ENCOUNTER — Ambulatory Visit: Payer: Medicare HMO | Admitting: Cardiovascular Disease

## 2021-10-16 VITALS — BP 126/74 | HR 93 | Ht 68.0 in | Wt 149.4 lb

## 2021-10-16 DIAGNOSIS — E782 Mixed hyperlipidemia: Secondary | ICD-10-CM

## 2021-10-16 DIAGNOSIS — I77810 Thoracic aortic ectasia: Secondary | ICD-10-CM | POA: Diagnosis not present

## 2021-10-16 DIAGNOSIS — I639 Cerebral infarction, unspecified: Secondary | ICD-10-CM

## 2021-10-16 DIAGNOSIS — I77819 Aortic ectasia, unspecified site: Secondary | ICD-10-CM | POA: Diagnosis not present

## 2021-10-16 MED ORDER — EZETIMIBE 10 MG PO TABS: 10.0000 mg | ORAL_TABLET | Freq: Every day | ORAL | 3 refills | Status: DC

## 2021-10-16 NOTE — Patient Instructions (Signed)
Medication Instructions:  Start Zetia 10 mg daily   *If you need a refill on your cardiac medications before your next appointment, please call your pharmacy*   Lab Work: LIPID (1 week before appointment in 3 months)   If you have labs (blood work) drawn today and your tests are completely normal, you will receive your results only by: [place] (if you have MyChart) OR A paper copy in the mail If you have any lab test that is abnormal or we need to change your treatment, we will call you to review the results.   Testing/Procedures: Non Contrast Chest CT.   Follow-Up: At [facility], you and your health needs are our priority.  As part of our continuing mission to provide you with exceptional heart care, we have created designated Provider Care Teams.  These Care Teams include your primary Cardiologist (physician) and Advanced Practice Providers (APPs -  Physician Assistants and Nurse Practitioners) who all work together to provide you with the care you need, when you need it.  We recommend signing up for the patient portal called "MyChart".  Sign up information is provided on this After Visit Summary.  MyChart is used to connect with patients for Virtual Visits (Telemedicine).  Patients are able to view lab/test results, encounter notes, upcoming appointments, etc.  Non-urgent messages can be sent to your provider as well.   To learn more about what you can do with MyChart, go to [email or web address].    Your next appointment:   3 month(s)  The format for your next appointment:   In Person  Provider:   Sande Rives, PA-C, Kennith Maes, or Almyra Deforest, PA-C    Then, Evalina Field, MD will plan to see you again in 12 month(s).

## 2021-10-23 ENCOUNTER — Telehealth: Payer: Self-pay | Admitting: Cardiovascular Disease

## 2021-10-23 NOTE — Telephone Encounter (Signed)
Returned call to patient who states that he is concerned about exposure to radiation for Non contrast CT tomorrow. Patient states that he would like to check with Dr. Audie Box to see if this is absolutely necessary before proceeding. Patient states he is going to call and cancel for now and if Dr. Audie Box recommends it he will re schedule. Advised patient I would forward to Dr. Audie Box for him to review and advise. Patient verbalized understanding.

## 2021-10-23 NOTE — Telephone Encounter (Signed)
Patient is calling wanting to know if Dr. Audie Box feels he needs the CT scheduled for tomorrow. He states if he thinks he would be okay to wait until next year he would prefer that due to the amount of radiation he's had to receive. Please advise.

## 2021-10-23 NOTE — Telephone Encounter (Signed)
I think it is necessary.  If his aorta is stable on the CT scan he will not need any further CT scans.  We will provide valuable information.  I do understand that there is radiation but I think this is necessary.   Bryce Bells T. Audie Box, MD, [place]  [address] [place]  [place], [place] [address]  [phone]  4:18 PM   Returned call to patient, advised him of the above. Patient states he will call back to reschedule CT. Will forward to Dr. Kathalene Frames nurse to make her aware.

## 2021-10-24 ENCOUNTER — Ambulatory Visit (HOSPITAL_BASED_OUTPATIENT_CLINIC_OR_DEPARTMENT_OTHER): Admission: RE | Admit: 2021-10-24 | Payer: Medicare HMO | Source: Ambulatory Visit

## 2021-11-09 ENCOUNTER — Other Ambulatory Visit: Payer: Self-pay

## 2021-11-09 ENCOUNTER — Ambulatory Visit (HOSPITAL_BASED_OUTPATIENT_CLINIC_OR_DEPARTMENT_OTHER): Payer: Medicare HMO

## 2021-11-09 ENCOUNTER — Ambulatory Visit (HOSPITAL_BASED_OUTPATIENT_CLINIC_OR_DEPARTMENT_OTHER)
Admission: RE | Admit: 2021-11-09 | Discharge: 2021-11-09 | Disposition: A | Discharge status: Home / Self Care | Payer: Medicare HMO | Source: Ambulatory Visit | Attending: Cardiovascular Disease | Admitting: Cardiovascular Disease

## 2021-11-09 DIAGNOSIS — K7689 Other specified diseases of liver: Secondary | ICD-10-CM | POA: Diagnosis not present

## 2021-11-09 DIAGNOSIS — I712 Thoracic aortic aneurysm, without rupture, unspecified: Secondary | ICD-10-CM | POA: Diagnosis not present

## 2021-11-09 DIAGNOSIS — I2510 Atherosclerotic heart disease of native coronary artery without angina pectoris: Secondary | ICD-10-CM | POA: Insufficient documentation

## 2021-11-09 DIAGNOSIS — I77819 Aortic ectasia, unspecified site: Secondary | ICD-10-CM | POA: Insufficient documentation

## 2021-11-10 DIAGNOSIS — G4710 Hypersomnia, unspecified: Secondary | ICD-10-CM | POA: Diagnosis not present

## 2021-11-10 DIAGNOSIS — R0683 Snoring: Secondary | ICD-10-CM | POA: Diagnosis not present

## 2021-11-10 DIAGNOSIS — G4700 Insomnia, unspecified: Secondary | ICD-10-CM | POA: Diagnosis not present

## 2021-11-17 ENCOUNTER — Ambulatory Visit (INDEPENDENT_AMBULATORY_CARE_PROVIDER_SITE_OTHER): Payer: Medicare HMO

## 2021-11-17 DIAGNOSIS — I639 Cerebral infarction, unspecified: Secondary | ICD-10-CM | POA: Diagnosis not present

## 2021-11-17 LAB — CUP PACEART REMOTE DEVICE CHECK
Date Time Interrogation Session: 20230319230613
Implantable Pulse Generator Implant Date: 20210111

## 2021-11-26 NOTE — Progress Notes (Signed)
Carelink Summary Report / Loop Recorder 

## 2021-11-27 DIAGNOSIS — L821 Other seborrheic keratosis: Secondary | ICD-10-CM | POA: Diagnosis not present

## 2021-11-27 DIAGNOSIS — D485 Neoplasm of uncertain behavior of skin: Secondary | ICD-10-CM | POA: Diagnosis not present

## 2021-11-27 DIAGNOSIS — D225 Melanocytic nevi of trunk: Secondary | ICD-10-CM | POA: Diagnosis not present

## 2021-11-27 DIAGNOSIS — L570 Actinic keratosis: Secondary | ICD-10-CM | POA: Diagnosis not present

## 2021-11-27 DIAGNOSIS — L308 Other specified dermatitis: Secondary | ICD-10-CM | POA: Diagnosis not present

## 2021-11-27 DIAGNOSIS — L814 Other melanin hyperpigmentation: Secondary | ICD-10-CM | POA: Diagnosis not present

## 2021-11-27 DIAGNOSIS — S30860A Insect bite (nonvenomous) of lower back and pelvis, initial encounter: Secondary | ICD-10-CM | POA: Diagnosis not present

## 2021-12-22 ENCOUNTER — Ambulatory Visit (INDEPENDENT_AMBULATORY_CARE_PROVIDER_SITE_OTHER): Payer: Medicare HMO

## 2021-12-22 DIAGNOSIS — I639 Cerebral infarction, unspecified: Secondary | ICD-10-CM | POA: Diagnosis not present

## 2021-12-22 LAB — CUP PACEART REMOTE DEVICE CHECK
Date Time Interrogation Session: 20230421230345
Implantable Pulse Generator Implant Date: 20210111

## 2022-01-07 NOTE — Progress Notes (Signed)
Carelink Summary Report / Loop Recorder 

## 2022-01-08 ENCOUNTER — Telehealth: Payer: Self-pay | Admitting: Cardiovascular Disease

## 2022-01-08 DIAGNOSIS — E782 Mixed hyperlipidemia: Secondary | ICD-10-CM

## 2022-01-08 NOTE — Telephone Encounter (Signed)
Patient states he reading something about LPA lipo protein A.  He wants to know if this is part of the lipid panel that is ordered for him.  If not he would like to get it ordered. Please advise.  ?

## 2022-01-08 NOTE — Telephone Encounter (Signed)
Returned call to patient who states that he has heard about the Lpa blood work for his cholesterol and would like to know if Dr. Audie Box thinks that he should have this added on to his Lipid panel that is already ordered to be done prior to his OV next week with Almyra Deforest PA-C. Patient states he can come today or tomorrow for blood work. Advised patient I would forward message to Dr. Audie Box for review. Patient verbalized understanding.  ? ? ?

## 2022-01-09 DIAGNOSIS — E782 Mixed hyperlipidemia: Secondary | ICD-10-CM | POA: Diagnosis not present

## 2022-01-09 NOTE — Telephone Encounter (Signed)
Attempted to call patient, unable to reach. Left message for patient stating that Lpa has ben added to blood work. Advised through message to call back with any concerns, call back number provided.  ?

## 2022-01-10 LAB — LIPID PANEL
Chol/HDL Ratio: 2.1 ratio (ref 0.0–5.0)
Cholesterol, Total: 169 mg/dL (ref 100–199)
HDL: 79 mg/dL (ref >39)
LDL Chol Calc (NIH): 80 mg/dL (ref 0–99)
Triglycerides: 47 mg/dL (ref 0–149)
VLDL Cholesterol Cal: 10 mg/dL (ref 5–40)

## 2022-01-12 LAB — LIPOPROTEIN A (LPA): Lipoprotein (a): <8.4 nmol/L (ref <75.0)

## 2022-01-16 ENCOUNTER — Other Ambulatory Visit: Payer: Self-pay

## 2022-01-16 ENCOUNTER — Ambulatory Visit: Payer: Medicare HMO | Admitting: Physician Assistant

## 2022-01-16 ENCOUNTER — Encounter: Payer: Self-pay | Admitting: Physician Assistant

## 2022-01-16 VITALS — BP 108/72 | HR 83 | Ht 68.0 in | Wt 151.0 lb

## 2022-01-16 DIAGNOSIS — I7121 Aneurysm of the ascending aorta, without rupture: Secondary | ICD-10-CM

## 2022-01-16 DIAGNOSIS — E782 Mixed hyperlipidemia: Secondary | ICD-10-CM | POA: Diagnosis not present

## 2022-01-16 DIAGNOSIS — I639 Cerebral infarction, unspecified: Secondary | ICD-10-CM

## 2022-01-16 MED ORDER — PRAVASTATIN SODIUM 10 MG PO TABS: 10.0000 mg | ORAL_TABLET | Freq: Every evening | ORAL | 3 refills | Status: DC

## 2022-01-16 NOTE — Patient Instructions (Signed)
Medication Instructions:  Your physician recommends that you continue on your current medications as directed. Please refer to the Current Medication list given to you today.  *If you need a refill on your cardiac medications before your next appointment, please call your pharmacy*  Lab Work: NONE ordered at this time of appointment   If you have labs (blood work) drawn today and your tests are completely normal, you will receive your results only by: [place] (if you have MyChart) OR A paper copy in the mail If you have any lab test that is abnormal or we need to change your treatment, we will call you to review the results.  Testing/Procedures: NONE ordered at this time of appointment   Follow-Up: At [facility], you and your health needs are our priority.  As part of our continuing mission to provide you with exceptional heart care, we have created designated Provider Care Teams.  These Care Teams include your primary Cardiologist (physician) and Advanced Practice Providers (APPs -  Physician Assistants and Nurse Practitioners) who all work together to provide you with the care you need, when you need it.   Your next appointment:   9 month(s)  The format for your next appointment:   In Person  Provider:   Evalina Field, MD     Other Instructions   Important Information About Sugar

## 2022-01-16 NOTE — Progress Notes (Signed)
Cardiology Office Note:    Date:  01/18/2022   ID:  Bryce [place]., DOB 01/14/1951, MRN [id number]  PCP:  Kathyrn Lass, MD   [place] Prov[facility] Cardiologist:  Evalina Field, MD     Referring MD: Kathyrn Lass, MD   Chief Complaint  Patient presents with   Follow-up    3 months.    History of Present Illness:    Bryce Hurlbut. is a 71 y.o. male with a hx of cryptogenic stroke s/p loop recorder, hyperlipidemia, ascending thoracic aortic aneurysm.  Patient had a left MCA stroke in January 2021 and treated with PTA.  TEE was negative for PFO or left atrial thrombus.  Carotid Doppler showed mild right ICA stenosis.  A loop recorder was placed however has not showed any atrial fibrillation since.  CT of the chest obtained in 2019 revealed an a 41 mm ascending thoracic aorta.  Echocardiogram in January 2021 showed 40 mm dilatation of the aortic root.  Patient was last seen by Dr. Audie Box in February 2023, Zetia was added on top of pravastatin.  1 year follow-up was recommended.  Repeat CT of the chest obtained on 11/09/2021 demonstrated 3.6 cm ascending aortic dilatation.  Most recent lipid panel obtained on 01/09/2022 demonstrated total cholesterol 169, HDL 79, triglyceride 47, LDL 80.  Lipoprotein a was normal.  He was continued on current medication.  Last loop recorder device interrogation in April demonstrated no atrial fibrillation, normal device function.  Patient presented today for cardiology follow-up.  I explained to recent CT image.  He denies any exertional chest pain worsening shortness of breath.  He has no lower extremity edema, orthopnea or PND.  He wished to get back to the golf course to increase activity level which I think is a good idea.  He can follow-up with Dr. Audie Box next February.  We will refill his pravastatin.   Past Medical History:  Diagnosis Date   Ascending aortic aneurysm ([place])    03/02/18 stable CT asc aorta 41 mm, 42 mm previously. also note aortic  atherosclerosis.  (per [facility] notes).   Chronic prostatitis    ELEVATED PSA . ED DR. DAHLSTEDT   Coronary artery disease    Depression    Diverticulosis 01/2017   in the sigmoid colon and in the descending colon, noted on colonoscopy. repeat in 5 yrs   Dizziness 07/2010   DR. ROSEN, NORMAL EF, MILD LEAKINESS OF MITRAL VALVE, MILD STIFFNESS OF HEART MUSCLE, MILD ENLARGEMENT OF AORTIC ROOT, REPEAT IN 1 YEAR DR. Radford Pax, NL ETT DR. Radford Pax 12/11   H/O: GI bleed 2002   DR. HAYES    Hyperlipidemia    Insomnia    ON Duanne Moron, DR. MILLER   Intracerebral hematoma ([place]) 2013   [place]   Migraine headache    Mild bipolar disorder ([place]) 01/2010   DR. [place], CHR DEPRESSION, FATIGUE OFF MEDS FOR AWHILE, CONTROLLED   Side effect of drug    side effects with statins   Stroke ([place]) 09/07/2019   per notes from [place]   Tubular adenoma 09/17/2011   [place]    Past Surgical History:  Procedure Laterality Date   BUBBLE STUDY  09/12/2019   Procedure: BUBBLE STUDY;  Surgeon: [name], MD;  Location: [facility];  Service: Cardiovascular;;   COLONOSCOPY  08/31/2000   IR CT HEAD LTD  09/07/2019   IR PERCUTANEOUS ART THROMBECTOMY/INFUSION INTRACRANIAL INC DIAG ANGIO  09/07/2019  IR RADIOLOGIST EVAL & MGMT  06/25/2021   L MCA angioplasty, admitted 09/07/19-09/12/19 stroke     LEFT KNEE ATHROSCOPY  08/31/2000   LEFT LEG AMBULATORY PHLEBECTOMY  08/31/2000   LOOP RECORDER INSERTION N/A 09/12/2019   Procedure: LOOP RECORDER INSERTION;  Surgeon: Thompson Grayer, MD;  Location: [facility];  Service: Cardiovascular;  Laterality: N/A;   RADIOLOGY WITH ANESTHESIA N/A 09/07/2019   Procedure: RADIOLOGY WITH ANESTHESIA;  Surgeon: Luanne Bras, MD;  Location: [facility];  Service: Radiology;  Laterality: N/A;   TEE WITHOUT CARDIOVERSION N/A 09/12/2019   Procedure: TRANSESOPHAGEAL ECHOCARDIOGRAM (TEE);  Surgeon: [name], MD;   Location: [facility] END[facility]COPY;  Service: Cardiovascular;  Laterality: N/A;    Current Medications: Current Meds  Medication Sig   Acetylcysteine (NAC PO) Take 1,000-2,000 mg by mouth.   ALPRAZolam (XANAX) 0.5 MG tablet Take 1 mg by mouth at bedtime.    Ascorbic Acid (VITAMIN C) 1000 MG tablet Take 1,000 mg by mouth daily.   aspirin 81 MG EC tablet Take 81 mg by mouth daily. Swallow whole.   Betaine, Trimethylglycine, (TMG, TRIMETHYLGLYCINE, PO) Take by mouth.   Cholecalciferol (VITAMIN D-3) 125 MCG (5000 UT) TABS Take 5,000 Units by mouth daily.   Coenzyme Q10 (CO Q 10) 100 MG CAPS Take 1 capsule by mouth daily.   Magnesium 400 MG CAPS Take 1 capsule by mouth daily. With a meal   Menaquinone-7 (VITAMIN K2 PO) Take 1 tablet by mouth daily.   Niacinamide-Zn-Cu-Methfo-Se-Cr (NICOTINAMIDE PO) Take by mouth.   NON FORMULARY LITHIUM OROTATE   tadalafil (CIALIS) 5 MG tablet Take 5 mg by mouth daily as needed for erectile dysfunction.   traZODone (DESYREL) 50 MG tablet Take 50 mg by mouth at bedtime as needed for sleep.   [DISCONTINUED] Cholecalciferol (VITAMIN D3) 1.25 MG (50000 UT) CAPS Take by mouth as needed.    [DISCONTINUED] Resveratrol 100 MG CAPS Take 600 mg by mouth.   [DISCONTINUED] UNABLE TO FIND Take 650 mg by mouth. Med Name: [place]ha     Allergies:   Statins and Sulfa antibiotics   Social History   Socioeconomic History   Marital status: Divorced    Spouse name: Not on file   Number of children: Not on file   Years of education: Not on file   Highest education level: Not on file  Occupational History   Not on file  Tobacco Use   Smoking status: Never   Smokeless tobacco: Never  Vaping Use   Vaping Use: Not on file  Substance and Sexual Activity   Alcohol use: Yes    Alcohol/week: 2.0 - 6.0 standard drinks    Types: 2 - 6 Glasses of wine per week    Comment: 2-6 PER WEEK   Drug use: Not Currently    Types: Marijuana    Comment: CBN oil to help him sleep   Sexual  activity: Not Currently  Other Topics Concern   Not on file  Social History Narrative   Lives alone   Right handed   Caffeine: 1.5 cups/day   Social Determinants of Health   Financial Resource Strain: Not on file  Food Insecurity: Not on file  Transportation Needs: Not on file  Physical Activity: Not on file  Stress: Not on file  Social Connections: Not on file     Family History: The patient's family history includes Alcohol abuse in his son; Alcoholism in his son; Cancer - Other in his mother; Heart disease in his father;  High Cholesterol in his sister; Hypertension in his brother, father, and mother; Melanoma in his sister.  ROS:   Please see the history of present illness.     All other systems reviewed and are negative.  EKGs/Labs/Other Studies Reviewed:    The following studies were reviewed today:  TEE 09/12/2019  1. Left ventricular ejection fraction, by visual estimation, is 55 to  60%. The left ventricle has normal function. There is no left ventricular  hypertrophy.   2. The left ventricle has no regional wall motion abnormalities.   3. Global right ventricle has normal systolic function.The right  ventricular size is normal. No increase in right ventricular wall  thickness.   4. Left atrial size was normal.   5. Right atrial size was normal.   6. The mitral valve is grossly normal. Trivial mitral valve  regurgitation.   7. The tricuspid valve is grossly normal.   8. The aortic valve is tricuspid. Aortic valve regurgitation is not  visualized.   9. The pulmonic valve was grossly normal. Pulmonic valve regurgitation is  not visualized.  10. 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  11. Aortic dilatation noted.  12. No intracardiac thrombi or masses were visualized.   EKG:  EKG is not ordered today.    Recent Labs: 09/23/2021: BUN 16; Creatinine, Ser 1.22; Hemoglobin 15.0; Platelets 188; Potassium 3.7; Sodium 140  Recent Lipid Panel     Component Value Date/Time   CHOL 169 01/09/2022 1427   TRIG 47 01/09/2022 1427   HDL 79 01/09/2022 1427   CHOLHDL 2.1 01/09/2022 1427   CHOLHDL 2.8 09/08/2019 0418   VLDL 13 09/08/2019 0418   LDLCALC 80 01/09/2022 1427     Risk Assessment/Calculations:           Physical Exam:    VS:  BP 108/72 (BP Location: Left Arm, Patient Position: Sitting, Cuff Size: Normal)   Pulse 83   Ht '5\' 8"'$  (1.727 m)   Wt 151 lb (68.5 kg)   BMI 22.96 kg/m     Wt Readings from Last 3 Encounters:  01/16/22 151 lb (68.5 kg)  10/16/21 149 lb 6.4 oz (67.8 kg)  09/23/21 150 lb (68 kg)     GEN:  Well nourished, well developed in no acute distress HEENT: Normal NECK: No JVD; No carotid bruits LYMPHATICS: No lymphadenopathy CARDIAC: RRR, no murmurs, rubs, gallops RESPIRATORY:  Clear to auscultation without rales, wheezing or rhonchi  ABDOMEN: Soft, non-tender, non-distended MUSCULOSKELETAL:  No edema; No deformity  SKIN: Warm and dry NEUROLOGIC:  Alert and oriented x 3 PSYCHIATRIC:  Normal affect   ASSESSMENT:    1. Aneurysm of ascending aorta without rupture ([place])   2. Cryptogenic stroke ([place]) L MCA s/p tPA and IR   3. Mixed hyperlipidemia    PLAN:    In order of problems listed above:  Dilated aortic root: Stable on last CT scan.  The size of the aortic root is actually smaller when compared to the previous study.  Blood pressure is very well controlled  Cryptogenic stroke s/p loop recorder: Interrogation has not shown any atrial fibrillation  Hyperlipidemia: Continue aspirin, Zetia and pravastatin           Medication Adjustments/Labs and Tests Ordered: Current medicines are reviewed at length with the patient today.  Concerns regarding medicines are outlined above.  No orders of the defined types were placed in this encounter.  No orders of the defined types were placed in  this encounter.   Patient Instructions  Medication Instructions:  Your physician recommends that  you continue on your current medications as directed. Please refer to the Current Medication list given to you today.  *If you need a refill on your cardiac medications before your next appointment, please call your pharmacy*  Lab Work: NONE ordered at this time of appointment   If you have labs (blood work) drawn today and your tests are completely normal, you will receive your results only by: [place] (if you have MyChart) OR A paper copy in the mail If you have any lab test that is abnormal or we need to change your treatment, we will call you to review the results.  Testing/Procedures: NONE ordered at this time of appointment   Follow-Up: At [facility], you and your health needs are our priority.  As part of our continuing mission to provide you with exceptional heart care, we have created designated Provider Care Teams.  These Care Teams include your primary Cardiologist (physician) and Advanced Practice Providers (APPs -  Physician Assistants and Nurse Practitioners) who all work together to provide you with the care you need, when you need it.   Your next appointment:   9 month(s)  The format for your next appointment:   In Person  Provider:   Evalina Field, MD     Other Instructions   Important Information About Sugar         Hilbert Corrigan, [place]  01/18/2022 11:34 PM    [place]

## 2022-01-24 LAB — CUP PACEART REMOTE DEVICE CHECK
Date Time Interrogation Session: 20230524230851
Implantable Pulse Generator Implant Date: 20210111

## 2022-01-27 ENCOUNTER — Ambulatory Visit (INDEPENDENT_AMBULATORY_CARE_PROVIDER_SITE_OTHER): Payer: Medicare HMO

## 2022-01-27 DIAGNOSIS — I639 Cerebral infarction, unspecified: Secondary | ICD-10-CM | POA: Diagnosis not present

## 2022-02-12 NOTE — Progress Notes (Signed)
Carelink Summary Report / Loop Recorder 

## 2022-02-17 DIAGNOSIS — F319 Bipolar disorder, unspecified: Secondary | ICD-10-CM | POA: Diagnosis not present

## 2022-02-17 DIAGNOSIS — R0982 Postnasal drip: Secondary | ICD-10-CM | POA: Diagnosis not present

## 2022-02-17 DIAGNOSIS — K625 Hemorrhage of anus and rectum: Secondary | ICD-10-CM | POA: Diagnosis not present

## 2022-02-17 DIAGNOSIS — M79641 Pain in right hand: Secondary | ICD-10-CM | POA: Diagnosis not present

## 2022-02-17 DIAGNOSIS — R053 Chronic cough: Secondary | ICD-10-CM | POA: Diagnosis not present

## 2022-03-02 ENCOUNTER — Ambulatory Visit (INDEPENDENT_AMBULATORY_CARE_PROVIDER_SITE_OTHER): Payer: Medicare HMO

## 2022-03-02 DIAGNOSIS — I639 Cerebral infarction, unspecified: Secondary | ICD-10-CM

## 2022-03-03 LAB — CUP PACEART REMOTE DEVICE CHECK
Date Time Interrogation Session: 20230702230205
Implantable Pulse Generator Implant Date: 20210111

## 2022-03-26 NOTE — Progress Notes (Signed)
Carelink Summary Report / Loop Recorder 

## 2022-04-06 ENCOUNTER — Ambulatory Visit (INDEPENDENT_AMBULATORY_CARE_PROVIDER_SITE_OTHER): Payer: Medicare HMO

## 2022-04-06 DIAGNOSIS — I639 Cerebral infarction, unspecified: Secondary | ICD-10-CM | POA: Diagnosis not present

## 2022-04-06 LAB — CUP PACEART REMOTE DEVICE CHECK
Date Time Interrogation Session: 20230804230944
Implantable Pulse Generator Implant Date: 20210111

## 2022-05-06 LAB — CUP PACEART REMOTE DEVICE CHECK
Date Time Interrogation Session: 20230906230610
Implantable Pulse Generator Implant Date: 20210111

## 2022-05-07 NOTE — Progress Notes (Signed)
Carelink Summary Report / Loop Recorder 

## 2022-05-11 ENCOUNTER — Ambulatory Visit (INDEPENDENT_AMBULATORY_CARE_PROVIDER_SITE_OTHER): Payer: Medicare HMO

## 2022-05-11 DIAGNOSIS — I639 Cerebral infarction, unspecified: Secondary | ICD-10-CM

## 2022-05-28 NOTE — Progress Notes (Signed)
Carelink Summary Report / Loop Recorder 

## 2022-06-15 ENCOUNTER — Ambulatory Visit (INDEPENDENT_AMBULATORY_CARE_PROVIDER_SITE_OTHER): Payer: Medicare HMO

## 2022-06-15 DIAGNOSIS — I639 Cerebral infarction, unspecified: Secondary | ICD-10-CM | POA: Diagnosis not present

## 2022-06-15 LAB — CUP PACEART REMOTE DEVICE CHECK
Date Time Interrogation Session: 20231009230307
Implantable Pulse Generator Implant Date: 20210111

## 2022-06-22 DIAGNOSIS — K648 Other hemorrhoids: Secondary | ICD-10-CM | POA: Diagnosis not present

## 2022-06-22 DIAGNOSIS — Z09 Encounter for follow-up examination after completed treatment for conditions other than malignant neoplasm: Secondary | ICD-10-CM | POA: Diagnosis not present

## 2022-06-22 DIAGNOSIS — D122 Benign neoplasm of ascending colon: Secondary | ICD-10-CM | POA: Diagnosis not present

## 2022-06-22 DIAGNOSIS — Z86010 Personal history of colonic polyps: Secondary | ICD-10-CM | POA: Diagnosis not present

## 2022-06-22 DIAGNOSIS — D125 Benign neoplasm of sigmoid colon: Secondary | ICD-10-CM | POA: Diagnosis not present

## 2022-06-22 DIAGNOSIS — D124 Benign neoplasm of descending colon: Secondary | ICD-10-CM | POA: Diagnosis not present

## 2022-06-24 DIAGNOSIS — D125 Benign neoplasm of sigmoid colon: Secondary | ICD-10-CM | POA: Diagnosis not present

## 2022-06-24 DIAGNOSIS — D122 Benign neoplasm of ascending colon: Secondary | ICD-10-CM | POA: Diagnosis not present

## 2022-06-24 DIAGNOSIS — D123 Benign neoplasm of transverse colon: Secondary | ICD-10-CM | POA: Diagnosis not present

## 2022-06-25 ENCOUNTER — Other Ambulatory Visit (HOSPITAL_COMMUNITY): Payer: Self-pay | Admitting: Interventional Radiology

## 2022-06-25 ENCOUNTER — Telehealth (HOSPITAL_COMMUNITY): Payer: Self-pay

## 2022-06-25 DIAGNOSIS — I639 Cerebral infarction, unspecified: Secondary | ICD-10-CM

## 2022-06-25 DIAGNOSIS — H9312 Tinnitus, left ear: Secondary | ICD-10-CM

## 2022-06-25 NOTE — Telephone Encounter (Signed)
Called to schedule mri, no answer, left vm. AW 

## 2022-07-06 NOTE — Progress Notes (Signed)
Carelink Summary Report / Loop Recorder 

## 2022-07-10 ENCOUNTER — Ambulatory Visit (HOSPITAL_COMMUNITY)
Admission: RE | Admit: 2022-07-10 | Discharge: 2022-07-10 | Disposition: A | Discharge status: Home / Self Care | Payer: Medicare HMO | Source: Ambulatory Visit | Attending: Interventional Radiology | Admitting: Interventional Radiology

## 2022-07-10 DIAGNOSIS — I639 Cerebral infarction, unspecified: Secondary | ICD-10-CM | POA: Diagnosis not present

## 2022-07-10 DIAGNOSIS — H9312 Tinnitus, left ear: Secondary | ICD-10-CM | POA: Diagnosis not present

## 2022-07-10 MED ORDER — GADOBUTROL 1 MMOL/ML IV SOLN
7.5000 mL | Freq: Once | INTRAVENOUS | Status: AC | PRN
  Administered 2022-07-10: 7.5 mL via INTRAVENOUS

## 2022-07-13 DIAGNOSIS — Z1389 Encounter for screening for other disorder: Secondary | ICD-10-CM | POA: Diagnosis not present

## 2022-07-13 DIAGNOSIS — Z0000 Encounter for general adult medical examination without abnormal findings: Secondary | ICD-10-CM | POA: Diagnosis not present

## 2022-07-13 DIAGNOSIS — Z6823 Body mass index (BMI) 23.0-23.9, adult: Secondary | ICD-10-CM | POA: Diagnosis not present

## 2022-07-13 DIAGNOSIS — Z23 Encounter for immunization: Secondary | ICD-10-CM | POA: Diagnosis not present

## 2022-07-15 ENCOUNTER — Telehealth (HOSPITAL_COMMUNITY): Payer: Self-pay

## 2022-07-15 NOTE — Telephone Encounter (Signed)
Called pt to let him know that per Dr. Estanislado Pandy, he no longer has to do anymore f/u's. He is to give [place] a call if he starts to develop any new symptoms. Pt agreed with this plan. AW

## 2022-07-20 ENCOUNTER — Ambulatory Visit (INDEPENDENT_AMBULATORY_CARE_PROVIDER_SITE_OTHER): Payer: Medicare HMO

## 2022-07-20 DIAGNOSIS — I639 Cerebral infarction, unspecified: Secondary | ICD-10-CM

## 2022-07-21 LAB — CUP PACEART REMOTE DEVICE CHECK
Date Time Interrogation Session: 20231119230510
Implantable Pulse Generator Implant Date: 20210111

## 2022-07-27 DIAGNOSIS — N401 Enlarged prostate with lower urinary tract symptoms: Secondary | ICD-10-CM | POA: Diagnosis not present

## 2022-07-27 DIAGNOSIS — F3341 Major depressive disorder, recurrent, in partial remission: Secondary | ICD-10-CM | POA: Diagnosis not present

## 2022-07-27 DIAGNOSIS — I7121 Aneurysm of the ascending aorta, without rupture: Secondary | ICD-10-CM | POA: Diagnosis not present

## 2022-07-27 DIAGNOSIS — E7800 Pure hypercholesterolemia, unspecified: Secondary | ICD-10-CM | POA: Diagnosis not present

## 2022-07-27 DIAGNOSIS — N529 Male erectile dysfunction, unspecified: Secondary | ICD-10-CM | POA: Diagnosis not present

## 2022-07-27 DIAGNOSIS — G4700 Insomnia, unspecified: Secondary | ICD-10-CM | POA: Diagnosis not present

## 2022-07-27 DIAGNOSIS — Z8673 Personal history of transient ischemic attack (TIA), and cerebral infarction without residual deficits: Secondary | ICD-10-CM | POA: Diagnosis not present

## 2022-07-27 DIAGNOSIS — E673 Hypervitaminosis D: Secondary | ICD-10-CM | POA: Diagnosis not present

## 2022-07-27 DIAGNOSIS — Z125 Encounter for screening for malignant neoplasm of prostate: Secondary | ICD-10-CM | POA: Diagnosis not present

## 2022-07-30 DIAGNOSIS — Z135 Encounter for screening for eye and ear disorders: Secondary | ICD-10-CM | POA: Diagnosis not present

## 2022-07-30 DIAGNOSIS — H524 Presbyopia: Secondary | ICD-10-CM | POA: Diagnosis not present

## 2022-07-30 DIAGNOSIS — H52223 Regular astigmatism, bilateral: Secondary | ICD-10-CM | POA: Diagnosis not present

## 2022-07-30 DIAGNOSIS — H2513 Age-related nuclear cataract, bilateral: Secondary | ICD-10-CM | POA: Diagnosis not present

## 2022-07-30 DIAGNOSIS — H5203 Hypermetropia, bilateral: Secondary | ICD-10-CM | POA: Diagnosis not present

## 2022-08-03 ENCOUNTER — Telehealth: Payer: Self-pay | Admitting: Pharmacist Clinician (PhC)/ Clinical Pharmacy Specialist

## 2022-08-03 DIAGNOSIS — E782 Mixed hyperlipidemia: Secondary | ICD-10-CM

## 2022-08-03 MED ORDER — PRAVASTATIN SODIUM 20 MG PO TABS: 20.0000 mg | ORAL_TABLET | Freq: Every evening | ORAL | 3 refills | Status: DC

## 2022-08-03 NOTE — Telephone Encounter (Signed)
Pt returned call - states had pain in his hand, to the point where he thought it was broken.  PCP told him to stop, pain has gone away.   Also felt weaker/more tired.    States previously took pravastatin 20 mg daily, with side effects, but has done fine with 10 mg.   Would like to consider 15 mg.  Advised would be easier to alternate every other day 10 mg or 20 mg.  He is agreeable to this.  Will send in rx to [facility] and repeat labs in 3 months.

## 2022-08-03 NOTE — Telephone Encounter (Signed)
Patient LM on VM Friday stating that he did not want to take ezetimibe any longer, but instead was thinking of increasing pravastatin (10 mg) to 1.5 tablets daily.   Returned call Monday, [facility]

## 2022-08-10 DIAGNOSIS — H524 Presbyopia: Secondary | ICD-10-CM | POA: Diagnosis not present

## 2022-08-25 ENCOUNTER — Ambulatory Visit (INDEPENDENT_AMBULATORY_CARE_PROVIDER_SITE_OTHER): Payer: Medicare HMO

## 2022-08-25 DIAGNOSIS — I639 Cerebral infarction, unspecified: Secondary | ICD-10-CM | POA: Diagnosis not present

## 2022-08-25 LAB — CUP PACEART REMOTE DEVICE CHECK
Date Time Interrogation Session: 20231225230547
Implantable Pulse Generator Implant Date: 20210111

## 2022-09-02 NOTE — Progress Notes (Signed)
Carelink Summary Report / Loop Recorder 

## 2022-09-21 NOTE — Progress Notes (Signed)
Carelink Summary Report / Loop Recorder

## 2022-09-29 ENCOUNTER — Ambulatory Visit: Payer: Medicare HMO

## 2022-09-29 DIAGNOSIS — I639 Cerebral infarction, unspecified: Secondary | ICD-10-CM | POA: Diagnosis not present

## 2022-09-30 LAB — CUP PACEART REMOTE DEVICE CHECK
Date Time Interrogation Session: 20240127230107
Implantable Pulse Generator Implant Date: 20210111

## 2022-10-23 NOTE — Progress Notes (Signed)
Carelink Summary Report / Loop Recorder 

## 2022-11-02 ENCOUNTER — Ambulatory Visit (INDEPENDENT_AMBULATORY_CARE_PROVIDER_SITE_OTHER): Payer: Medicare HMO

## 2022-11-02 DIAGNOSIS — I639 Cerebral infarction, unspecified: Secondary | ICD-10-CM | POA: Diagnosis not present

## 2022-11-02 LAB — CUP PACEART REMOTE DEVICE CHECK
Date Time Interrogation Session: 20240303230241
Implantable Pulse Generator Implant Date: 20210111

## 2022-11-06 ENCOUNTER — Other Ambulatory Visit: Payer: Self-pay | Admitting: Pharmacist Clinician (PhC)/ Clinical Pharmacy Specialist

## 2022-11-06 DIAGNOSIS — E782 Mixed hyperlipidemia: Secondary | ICD-10-CM

## 2022-11-13 DIAGNOSIS — Z6823 Body mass index (BMI) 23.0-23.9, adult: Secondary | ICD-10-CM | POA: Diagnosis not present

## 2022-11-13 DIAGNOSIS — R1012 Left upper quadrant pain: Secondary | ICD-10-CM | POA: Diagnosis not present

## 2022-11-13 DIAGNOSIS — F319 Bipolar disorder, unspecified: Secondary | ICD-10-CM | POA: Diagnosis not present

## 2022-11-13 DIAGNOSIS — E673 Hypervitaminosis D: Secondary | ICD-10-CM | POA: Diagnosis not present

## 2022-12-07 ENCOUNTER — Ambulatory Visit (INDEPENDENT_AMBULATORY_CARE_PROVIDER_SITE_OTHER): Payer: Medicare HMO

## 2022-12-07 DIAGNOSIS — I639 Cerebral infarction, unspecified: Secondary | ICD-10-CM

## 2022-12-07 LAB — CUP PACEART REMOTE DEVICE CHECK
Date Time Interrogation Session: 20240407230042
Implantable Pulse Generator Implant Date: 20210111

## 2022-12-10 NOTE — Progress Notes (Signed)
Carelink Summary Report / Loop Recorder 

## 2022-12-17 DIAGNOSIS — E782 Mixed hyperlipidemia: Secondary | ICD-10-CM | POA: Diagnosis not present

## 2022-12-18 LAB — HEPATIC FUNCTION PANEL
ALT: 16 IU/L (ref 0–44)
AST: 19 IU/L (ref 0–40)
Albumin: 4.5 g/dL (ref 3.9–4.9)
Alkaline Phosphatase: 47 IU/L (ref 44–121)
Bilirubin Total: 0.4 mg/dL (ref 0.0–1.2)
Bilirubin, Direct: 0.12 mg/dL (ref 0.00–0.40)
Total Protein: 6.9 g/dL (ref 6.0–8.5)

## 2022-12-18 LAB — LIPID PANEL
Chol/HDL Ratio: 3 ratio (ref 0.0–5.0)
Cholesterol, Total: 205 mg/dL — ABNORMAL HIGH (ref 100–199)
HDL: 69 mg/dL (ref >39)
LDL Chol Calc (NIH): 126 mg/dL — ABNORMAL HIGH (ref 0–99)
Triglycerides: 57 mg/dL (ref 0–149)
VLDL Cholesterol Cal: 10 mg/dL (ref 5–40)

## 2023-01-11 ENCOUNTER — Ambulatory Visit (INDEPENDENT_AMBULATORY_CARE_PROVIDER_SITE_OTHER): Payer: Medicare HMO

## 2023-01-11 DIAGNOSIS — I639 Cerebral infarction, unspecified: Secondary | ICD-10-CM

## 2023-01-12 LAB — CUP PACEART REMOTE DEVICE CHECK
Date Time Interrogation Session: 20240510230224
Implantable Pulse Generator Implant Date: 20210111

## 2023-01-13 NOTE — Progress Notes (Signed)
Carelink Summary Report / Loop Recorder 

## 2023-02-05 NOTE — Progress Notes (Signed)
Carelink Summary Report / Loop Recorder 

## 2023-02-09 NOTE — Progress Notes (Unsigned)
Cardiology Office Note:   Date:  02/11/2023  NAME:  Bryce Logan.    MRN: [id number] DOB:  11/22/1950   PCP:  Sigmund Hazel, MD  Cardiologist:  Reatha Harps, MD  Electrophysiologist:  None   Referring MD: Sigmund Hazel, MD   Chief Complaint  Patient presents with   Follow-up   History of Present Illness:   Bryce Logan. is a 72 y.o. male with a hx of CVA, dilated SoV, HLD who presents for follow-up.  No A-fib has been found.  Loop recorder in place.  BP stable.  LDL cholesterol 126.  Had issues with pravastatin.  Describes mental fog and inability to play guitar well.  He has stopped this.  We did discuss PCSK9 inhibitor therapy.  This has been discussed in the past.  We will reach out to have pharmacy reevaluate things.  CV exam unchanged.  Without any complaints.  Problem List 1. Cryptogenic Stroke -L MCA s/p tPA 09/2019 -Negative TEE for PFO/LAA thrombus  -mild R ICA plaque/no stenosis L ICA -T chol 205, HDL 69, LDL 126, TG 57 -A1c 5.4 -loop recorder in place (no afib found) 2. Dilated SoV -41 mm CT chest wo contrast 2019 -40 mm root TTE 09/08/2019 3. R transverse sinus thrombosis  Past Medical History: Past Medical History:  Diagnosis Date   Ascending aortic aneurysm (HCC)    03/02/18 stable CT asc aorta 41 mm, 42 mm previously. also note aortic atherosclerosis.  (per [facility] notes).   Chronic prostatitis    ELEVATED PSA . ED DR. DAHLSTEDT   Coronary artery disease    Depression    Diverticulosis 01/2017   in the sigmoid colon and in the descending colon, noted on colonoscopy. repeat in 5 yrs   Dizziness 07/2010   DR. ROSEN, NORMAL EF, MILD LEAKINESS OF MITRAL VALVE, MILD STIFFNESS OF HEART MUSCLE, MILD ENLARGEMENT OF AORTIC ROOT, REPEAT IN 1 YEAR DR. Mayford Knife, NL ETT DR. Mayford Knife 12/11   H/O: GI bleed 2002   DR. HAYES    Hyperlipidemia    Insomnia    ON Prudy Feeler, DR. MILLER   Intracerebral hematoma (HCC) 2013   [facility]   Migraine headache     Mild bipolar disorder (HCC) 01/2010   DR. PITTMAN DR. Evelene Croon, CHR DEPRESSION, FATIGUE OFF MEDS FOR AWHILE, CONTROLLED   Side effect of drug    side effects with statins   Stroke (HCC) 09/07/2019   per notes from [facility]   Tubular adenoma 09/17/2011   [facility]    Past Surgical History: Past Surgical History:  Procedure Laterality Date   BUBBLE STUDY  09/12/2019   Procedure: BUBBLE STUDY;  Surgeon: Chrystie Nose, MD;  Location: [facility] ENDOSCOPY;  Service: Cardiovascular;;   COLONOSCOPY  08/31/2000   IR CT HEAD LTD  09/07/2019   IR PERCUTANEOUS ART THROMBECTOMY/INFUSION INTRACRANIAL INC DIAG ANGIO  09/07/2019   IR RADIOLOGIST EVAL & MGMT  06/25/2021   L MCA angioplasty, admitted 09/07/19-09/12/19 stroke     LEFT KNEE ATHROSCOPY  08/31/2000   LEFT LEG AMBULATORY PHLEBECTOMY  08/31/2000   LOOP RECORDER INSERTION N/A 09/12/2019   Procedure: LOOP RECORDER INSERTION;  Surgeon: Hillis Range, MD;  Location: [facility] INVASIVE CV LAB;  Service: Cardiovascular;  Laterality: N/A;   RADIOLOGY WITH ANESTHESIA N/A 09/07/2019   Procedure: RADIOLOGY WITH ANESTHESIA;  Surgeon: Julieanne Cotton, MD;  Location: [facility] OR;  Service: Radiology;  Laterality: N/A;   TEE WITHOUT CARDIOVERSION N/A 09/12/2019  Procedure: TRANSESOPHAGEAL ECHOCARDIOGRAM (TEE);  Surgeon: Chrystie Nose, MD;  Location: [facility] ENDOSCOPY;  Service: Cardiovascular;  Laterality: N/A;    Current Medications: Current Meds  Medication Sig   Acetylcysteine (NAC PO) Take 1,000-2,000 mg by mouth.   ALPRAZolam (XANAX) 0.5 MG tablet Take 1 mg by mouth at bedtime.    Ascorbic Acid (VITAMIN C) 1000 MG tablet Take 1,000 mg by mouth daily.   aspirin 81 MG EC tablet Take 81 mg by mouth daily. Swallow whole.   Betaine, Trimethylglycine, (TMG, TRIMETHYLGLYCINE, PO) Take by mouth.   Cholecalciferol (VITAMIN D-3) 125 MCG (5000 UT) TABS Take 5,000 Units by mouth daily.   Coenzyme Q10 (CO Q 10) 100 MG CAPS Take 1 capsule by mouth daily.    Collagen Hydrolysate, Bovine, POWD 12 g daily.   Creatine Monohydrate POWD 5 g daily.   Magnesium 400 MG CAPS Take 1 capsule by mouth daily. With a meal   Melatonin 300 MCG TABS 600 mcg daily.   Menaquinone-7 (VITAMIN K2 PO) Take 1 tablet by mouth daily.   Niacinamide-Zn-Cu-Methfo-Se-Cr (NICOTINAMIDE PO) Take by mouth.   NON FORMULARY LITHIUM OROTATE   Omega-3 Fatty Acids (FISH OIL) 1000 MG CAPS    tadalafil (CIALIS) 5 MG tablet Take 5 mg by mouth daily as needed for erectile dysfunction.   traZODone (DESYREL) 50 MG tablet Take 50 mg by mouth at bedtime as needed for sleep.   TURMERIC CURCUMIN PO 665 mg daily.     Allergies:    Statins and Sulfa antibiotics   Social History: Social History   Socioeconomic History   Marital status: Divorced    Spouse name: Not on file   Number of children: Not on file   Years of education: Not on file   Highest education level: Not on file  Occupational History   Not on file  Tobacco Use   Smoking status: Never   Smokeless tobacco: Never  Vaping Use   Vaping Use: Not on file  Substance and Sexual Activity   Alcohol use: Yes    Alcohol/week: 2.0 - 6.0 standard drinks of alcohol    Types: 2 - 6 Glasses of wine per week    Comment: 2-6 PER WEEK   Drug use: Not Currently    Types: Marijuana    Comment: CBN oil to help him sleep   Sexual activity: Not Currently  Other Topics Concern   Not on file  Social History Narrative   Lives alone   Right handed   Caffeine: 1.5 cups/day   Social Determinants of Health   Financial Resource Strain: Not on file  Food Insecurity: Not on file  Transportation Needs: Not on file  Physical Activity: Not on file  Stress: Not on file  Social Connections: Not on file     Family History: The patient's family history includes Alcohol abuse in his son; Alcoholism in his son; Cancer - Other in his mother; Heart disease in his father; High Cholesterol in his sister; Hypertension in his brother, father,  and mother; Melanoma in his sister.  ROS:   All other ROS reviewed and negative. Pertinent positives noted in the HPI.     EKGs/Labs/Other Studies Reviewed:   The following studies were personally reviewed by me today:  Recent Labs: 12/17/2022: ALT 16   Recent Lipid Panel    Component Value Date/Time   CHOL 205 (H) 12/17/2022 1227   TRIG 57 12/17/2022 1227   HDL 69 12/17/2022 1227   CHOLHDL 3.0 12/17/2022 1227  CHOLHDL 2.8 09/08/2019 0418   VLDL 13 09/08/2019 0418   LDLCALC 126 (H) 12/17/2022 1227    Physical Exam:   VS:  BP 100/70 (BP Location: Left Arm, Patient Position: Sitting, Cuff Size: Normal)   Pulse 80   Ht 5\' 7"  (1.702 m)   Wt 152 lb 3.2 oz (69 kg)   SpO2 95%   BMI 23.84 kg/m    Wt Readings from Last 3 Encounters:  02/11/23 152 lb 3.2 oz (69 kg)  01/16/22 151 lb (68.5 kg)  10/16/21 149 lb 6.4 oz (67.8 kg)    General: Well nourished, well developed, in no acute distress Head: Atraumatic, normal size  Eyes: PEERLA, EOMI  Neck: Supple, no JVD Endocrine: No thryomegaly Cardiac: Normal S1, S2; RRR; no murmurs, rubs, or gallops Lungs: Clear to auscultation bilaterally, no wheezing, rhonchi or rales  Abd: Soft, nontender, no hepatomegaly  Ext: No edema, pulses 2+ Musculoskeletal: No deformities, BUE and BLE strength normal and equal Skin: Warm and dry, no rashes   Neuro: Alert and oriented to person, place, time, and situation, CNII-XII grossly intact, no focal deficits  Psych: Normal mood and affect   ASSESSMENT:   Bryce Logan. is a 72 y.o. male who presents for the following: 1. Cryptogenic stroke (HCC) L MCA s/p tPA and IR   2. Mixed hyperlipidemia   3. Dilated aortic root (HCC)     PLAN:   1. Cryptogenic stroke (HCC) L MCA s/p tPA and IR -Cryptogenic stroke.  Loop recorder with no A-fib.  Not on cholesterol-lowering medications.  Discussed PCSK9 inhibitor therapy.  It was cooperative in the past.  We will have pharmacy reevaluate and possibly have  him fill out patient assistance forms.  2. Mixed hyperlipidemia -We will have pharmacy reevaluate his cost of PCSK9 inhibitor therapy.  He appears to be statin intolerant.  He also tried Zetia.  Did not work.  Reports side effects.  I think the only option left is PCSK9 hypnotherapy versus Leqvio.  We will have pharmacy reevaluated.  3. Dilated aortic root (HCC) -Within limits.  No further testing.      Disposition: Return in about 1 year (around 02/11/2024).  Medication Adjustments/Labs and Tests Ordered: Current medicines are reviewed at length with the patient today.  Concerns regarding medicines are outlined above.  No orders of the defined types were placed in this encounter.  No orders of the defined types were placed in this encounter.   Patient Instructions  Medication Instructions:  The current medical regimen is effective;  continue present plan and medications.  *If you need a refill on your cardiac medications before your next appointment, please call your pharmacy*   Follow-Up: At [facility], you and your health needs are our priority.  As part of our continuing mission to provide you with exceptional heart care, we have created designated Provider Care Teams.  These Care Teams include your primary Cardiologist (physician) and Advanced Practice Providers (APPs -  Physician Assistants and Nurse Practitioners) who all work together to provide you with the care you need, when you need it.  We recommend signing up for the patient portal called "MyChart".  Sign up information is provided on this After Visit Summary.  MyChart is used to connect with patients for Virtual Visits (Telemedicine).  Patients are able to view lab/test results, encounter notes, upcoming appointments, etc.  Non-urgent messages can be sent to your provider as well.   To learn more about what you can  do with MyChart, go to [email or web address].    Your next appointment:   12  month(s)  Provider:   Reatha Harps, MD        Time Spent with Patient: I have spent a total of 25 minutes with patient reviewing hospital notes, telemetry, EKGs, labs and examining the patient as well as establishing an assessment and plan that was discussed with the patient.  > 50% of time was spent in direct patient care.  Signed, Lenna Gilford. Flora Lipps, MD, [facility]  [facility]  [address] [place], [place] [address] [phone]  02/11/2023 6:25 PM

## 2023-02-11 ENCOUNTER — Encounter: Payer: Self-pay | Admitting: Cardiovascular Disease

## 2023-02-11 ENCOUNTER — Ambulatory Visit: Payer: Medicare HMO | Attending: Cardiovascular Disease | Admitting: Cardiovascular Disease

## 2023-02-11 VITALS — BP 100/70 | HR 80 | Ht 67.0 in | Wt 152.2 lb

## 2023-02-11 DIAGNOSIS — I77810 Thoracic aortic ectasia: Secondary | ICD-10-CM | POA: Diagnosis not present

## 2023-02-11 DIAGNOSIS — E782 Mixed hyperlipidemia: Secondary | ICD-10-CM

## 2023-02-11 DIAGNOSIS — I639 Cerebral infarction, unspecified: Secondary | ICD-10-CM

## 2023-02-11 NOTE — Patient Instructions (Signed)
Medication Instructions:  The current medical regimen is effective;  continue present plan and medications.  *If you need a refill on your cardiac medications before your next appointment, please call your pharmacy*   Follow-Up: At [facility], you and your health needs are our priority.  As part of our continuing mission to provide you with exceptional heart care, we have created designated Provider Care Teams.  These Care Teams include your primary Cardiologist (physician) and Advanced Practice Providers (APPs -  Physician Assistants and Nurse Practitioners) who all work together to provide you with the care you need, when you need it.  We recommend signing up for the patient portal called "MyChart".  Sign up information is provided on this After Visit Summary.  MyChart is used to connect with patients for Virtual Visits (Telemedicine).  Patients are able to view lab/test results, encounter notes, upcoming appointments, etc.  Non-urgent messages can be sent to your provider as well.   To learn more about what you can do with MyChart, go to [email or web address].    Your next appointment:   12 month(s)  Provider:   Otis T O'Neal, MD   

## 2023-02-15 ENCOUNTER — Ambulatory Visit (INDEPENDENT_AMBULATORY_CARE_PROVIDER_SITE_OTHER): Payer: Medicare HMO

## 2023-02-15 DIAGNOSIS — I639 Cerebral infarction, unspecified: Secondary | ICD-10-CM

## 2023-02-15 LAB — CUP PACEART REMOTE DEVICE CHECK
Date Time Interrogation Session: 20240616230251
Implantable Pulse Generator Implant Date: 20210111

## 2023-02-17 ENCOUNTER — Other Ambulatory Visit: Payer: Self-pay

## 2023-02-17 DIAGNOSIS — E782 Mixed hyperlipidemia: Secondary | ICD-10-CM

## 2023-03-02 DIAGNOSIS — D485 Neoplasm of uncertain behavior of skin: Secondary | ICD-10-CM | POA: Diagnosis not present

## 2023-03-02 DIAGNOSIS — L570 Actinic keratosis: Secondary | ICD-10-CM | POA: Diagnosis not present

## 2023-03-02 DIAGNOSIS — L578 Other skin changes due to chronic exposure to nonionizing radiation: Secondary | ICD-10-CM | POA: Diagnosis not present

## 2023-03-02 DIAGNOSIS — D225 Melanocytic nevi of trunk: Secondary | ICD-10-CM | POA: Diagnosis not present

## 2023-03-02 DIAGNOSIS — L821 Other seborrheic keratosis: Secondary | ICD-10-CM | POA: Diagnosis not present

## 2023-03-02 DIAGNOSIS — L814 Other melanin hyperpigmentation: Secondary | ICD-10-CM | POA: Diagnosis not present

## 2023-03-08 NOTE — Progress Notes (Signed)
Carelink Summary Report / Loop Recorder 

## 2023-03-16 ENCOUNTER — Encounter: Payer: Self-pay | Admitting: Pharmacist Clinician (PhC)/ Clinical Pharmacy Specialist

## 2023-03-16 ENCOUNTER — Ambulatory Visit: Payer: Medicare HMO | Attending: Cardiology | Admitting: Pharmacist Clinician (PhC)/ Clinical Pharmacy Specialist

## 2023-03-16 DIAGNOSIS — E782 Mixed hyperlipidemia: Secondary | ICD-10-CM | POA: Diagnosis not present

## 2023-03-16 NOTE — Patient Instructions (Signed)
Your Results:             Your most recent labs Goal  Total Cholesterol 205 < 200  Triglycerides 57 < 150  HDL (happy/good cholesterol) 69 > 40  LDL (lousy/bad cholesterol 126 < 70   Medication changes:  Options include Repatha (every 2 week injection) or Leqvio (every 3-6 months)  When you make a decision you can send me a MyChart message Phillips Hay) or call me at [phone].      Thank you for choosing [facility]

## 2023-03-16 NOTE — Assessment & Plan Note (Addendum)
Assessment: Patient with ASCVD not at LDL goal of < 70 Most recent LDL 126 on 12/17/22  Not able to tolerate statins or ezetimibe secondary to myalgias (atorvastatin, pravastatin, rosuvastatin) Reviewed options for lowering LDL cholesterol, including  PCSK-9 inhibitors, bempedoic acid and inclisiran.  Discussed mechanisms of action, dosing, side effects, potential decreases in LDL cholesterol and costs.  Hesitant to try bempedoic acid, as he developed tendonitis after taking quinolone antibiotic for 6 weeks (prostate infection) years ago.   Plan: Patient would like to further research and think about his options for Repatha vs Leqvio He will reach out once he makes a decision - via MyChart

## 2023-03-16 NOTE — Progress Notes (Signed)
Office Visit    Patient Name: Bryce Logan. Date of Encounter: 03/16/2023  Primary Care Provider:  Sigmund Hazel, MD Primary Cardiologist:  Reatha Harps, MD  Chief Complaint    Hyperlipidemia   Significant Past Medical History   CVA Cryptogenic stroke 09/2019 - L MCA s/p tPA  CAD Noted on prior CT     Allergies  Allergen Reactions   Statins     Side effects   Sulfa Antibiotics     SEVERE FATIGUE AND SIDE EFFECTS    History of Present Illness    Bryce Logan. is a 72 y.o. male patient of Bryce Logan, in the office today to discuss options for cholesterol management.   He has tried multiple statin drugs, but had to stop secondary to myalgias, mostly in his hands that prevented him from playing guitar.  He is a Copy.    Insurance Carrier:  [employer] [phone]  Repatha $45/month, $125/3 months  Coverage gap $133/month  LDL Cholesterol goal:  LDL < 70  Current Medications:   ezetimibe 10 mg every day, pravastatin 20 mg every day   Previously tried:  atorvastatin, rosuvastatin, pravastatin - myalgia; ezetimibe - myalgias  Family Hx: fathre has had 2 MI, CABG, stent, now 42 (first at 38, next in 04-08-2020); mother died cancer; brother with hypertension, kidney issues, sister died melanoma; sister healthy (high cholesterol); son is 49 - healthy  Social Hx: Tobacco: no Alcohol: 3-4 drinks per week, wine , occasional beer   Diet:   eats out regularly but does try to make healthy choices, not much beef and pork; some vegetables, admits could do better, does eat salad regularly   Exercise: walk several times per week 30+ min , total gym getting into routine   Accessory Clinical Findings   Lab Results  Component Value Date   CHOL 205 (H) 12/17/2022   HDL 69 12/17/2022   LDLCALC 126 (H) 12/17/2022   TRIG 57 12/17/2022   CHOLHDL 3.0 12/17/2022    Lipoprotein (a)  Date/Time Value Ref Range Status  01/09/2022 02:26 PM <8.4 <75.0 nmol/L Final     Comment:    **Results verified by repeat testing** Note:  Values greater than or equal to 75.0 nmol/L may        indicate an independent risk factor for CHD,        but must be evaluated with caution when applied        to non-Caucasian populations due to the        influence of genetic factors on Lp(a) across        ethnicities.     Lab Results  Component Value Date   ALT 16 12/17/2022   AST 19 12/17/2022   ALKPHOS 47 12/17/2022   BILITOT 0.4 12/17/2022   Lab Results  Component Value Date   CREATININE 1.22 09/23/2021   BUN 16 09/23/2021   NA 140 09/23/2021   K 3.7 09/23/2021   CL 105 09/23/2021   CO2 26 09/23/2021   Lab Results  Component Value Date   HGBA1C 5.4 09/08/2019    Home Medications    Current Outpatient Medications  Medication Sig Dispense Refill   Niacinamide-Zn-Cu-Methfo-Se-Cr (NICOTINAMIDE PO) Take 1 tablet by mouth daily. 500-750 mg tablet     Acetylcysteine (NAC PO) Take 1,000-2,000 mg by mouth.     ALPRAZolam (XANAX) 0.5 MG tablet Take 1 mg by mouth at bedtime.   1  Ascorbic Acid (VITAMIN C) 1000 MG tablet Take 1,000 mg by mouth daily.     aspirin 81 MG EC tablet Take 81 mg by mouth daily. Swallow whole.     Betaine, Trimethylglycine, (TMG, TRIMETHYLGLYCINE, PO) Take 1,000 mg by mouth daily.     Cholecalciferol (VITAMIN D-3) 125 MCG (5000 UT) TABS Take 5,000 Units by mouth daily.     Coenzyme Q10 (CO Q 10) 100 MG CAPS Take 1 capsule by mouth daily.     Collagen Hydrolysate, Bovine, POWD 12 g daily.     Creatine Monohydrate POWD 5 g daily.     Magnesium 400 MG CAPS Take 1 capsule by mouth daily. With a meal     Melatonin 300 MCG TABS 600 mcg daily.     Menaquinone-7 (VITAMIN K2 PO) Take 1 tablet by mouth daily.     NON FORMULARY Take 10 mg by mouth daily. LITHIUM OROTATE     Omega-3 Fatty Acids (FISH OIL) 1000 MG CAPS      tadalafil (CIALIS) 5 MG tablet Take 5 mg by mouth daily as needed for erectile dysfunction.     traZODone (DESYREL) 50 MG  tablet Take 50 mg by mouth at bedtime as needed for sleep.     TURMERIC CURCUMIN PO 665 mg daily.     No current facility-administered medications for this visit.     Assessment & Plan    Hyperlipidemia Assessment: Patient with ASCVD not at LDL goal of < 70 Most recent LDL 126 on 12/17/22  Not able to tolerate statins or ezetimibe secondary to myalgias (atorvastatin, pravastatin, rosuvastatin) Reviewed options for lowering LDL cholesterol, including  PCSK-9 inhibitors, bempedoic acid and inclisiran.  Discussed mechanisms of action, dosing, side effects, potential decreases in LDL cholesterol and costs.   Plan: Patient would like to further research and think about his options for Repatha vs Leqvio He will reach out once he makes a decision - via MyChart   Phillips Hay, PharmD CPP [facility] [address]  [place], [place] [address] [phone]  03/16/2023, 3:04 PM

## 2023-03-22 ENCOUNTER — Ambulatory Visit (INDEPENDENT_AMBULATORY_CARE_PROVIDER_SITE_OTHER): Payer: Medicare HMO

## 2023-03-22 DIAGNOSIS — I639 Cerebral infarction, unspecified: Secondary | ICD-10-CM | POA: Diagnosis not present

## 2023-03-22 LAB — CUP PACEART REMOTE DEVICE CHECK
Date Time Interrogation Session: 20240719230301
Implantable Pulse Generator Implant Date: 20210111

## 2023-04-01 NOTE — Progress Notes (Signed)
Carelink Summary Report / Loop Recorder 

## 2023-04-12 ENCOUNTER — Telehealth: Payer: Self-pay | Admitting: Pharmacist Clinician (PhC)/ Clinical Pharmacy Specialist

## 2023-04-12 NOTE — Telephone Encounter (Signed)
Sent MyChart message to see if he wants to start medication

## 2023-04-15 DIAGNOSIS — N401 Enlarged prostate with lower urinary tract symptoms: Secondary | ICD-10-CM | POA: Diagnosis not present

## 2023-04-15 DIAGNOSIS — K625 Hemorrhage of anus and rectum: Secondary | ICD-10-CM | POA: Diagnosis not present

## 2023-04-15 DIAGNOSIS — F3341 Major depressive disorder, recurrent, in partial remission: Secondary | ICD-10-CM | POA: Diagnosis not present

## 2023-04-22 LAB — CUP PACEART REMOTE DEVICE CHECK
Date Time Interrogation Session: 20240821230235
Implantable Pulse Generator Implant Date: 20210111

## 2023-04-26 ENCOUNTER — Ambulatory Visit (INDEPENDENT_AMBULATORY_CARE_PROVIDER_SITE_OTHER): Payer: Medicare HMO

## 2023-04-26 DIAGNOSIS — I639 Cerebral infarction, unspecified: Secondary | ICD-10-CM | POA: Diagnosis not present

## 2023-05-05 NOTE — Progress Notes (Signed)
Carelink Summary Report / Loop Recorder 

## 2023-05-06 ENCOUNTER — Telehealth: Payer: Self-pay

## 2023-05-06 NOTE — Telephone Encounter (Signed)
Alert received from [employer]:  Alert remote transmission: AF There was one AF episode that was 3 hours and 16 minutes  Pt with loop implanted for cryptogenic stroke.  Appears to be afib/aflutter.  Will review with Dr. Lalla Brothers.

## 2023-05-06 NOTE — Telephone Encounter (Signed)
Reviewed transmission with Otilio Saber PA.  Confirmed AF/Flutter.  Left message for Pt requesting call back.

## 2023-05-06 NOTE — Telephone Encounter (Signed)
Call back received from Pt.  He is scheduled for afib office visit 05/07/2023 to discuss.

## 2023-05-07 ENCOUNTER — Encounter (HOSPITAL_COMMUNITY): Payer: Self-pay | Admitting: Physician Assistant

## 2023-05-07 ENCOUNTER — Ambulatory Visit (HOSPITAL_COMMUNITY)
Admission: RE | Admit: 2023-05-07 | Discharge: 2023-05-07 | Disposition: A | Discharge status: Home / Self Care | Payer: Medicare HMO | Source: Ambulatory Visit | Attending: Physician Assistant | Admitting: Physician Assistant

## 2023-05-07 VITALS — BP 118/78 | HR 87 | Ht 67.0 in | Wt 147.8 lb

## 2023-05-07 DIAGNOSIS — Z7901 Long term (current) use of anticoagulants: Secondary | ICD-10-CM | POA: Diagnosis not present

## 2023-05-07 DIAGNOSIS — I4891 Unspecified atrial fibrillation: Secondary | ICD-10-CM | POA: Diagnosis present

## 2023-05-07 DIAGNOSIS — D6869 Other thrombophilia: Secondary | ICD-10-CM | POA: Diagnosis not present

## 2023-05-07 DIAGNOSIS — I480 Paroxysmal atrial fibrillation: Secondary | ICD-10-CM | POA: Diagnosis not present

## 2023-05-07 DIAGNOSIS — Z8673 Personal history of transient ischemic attack (TIA), and cerebral infarction without residual deficits: Secondary | ICD-10-CM | POA: Diagnosis not present

## 2023-05-07 DIAGNOSIS — I2510 Atherosclerotic heart disease of native coronary artery without angina pectoris: Secondary | ICD-10-CM | POA: Insufficient documentation

## 2023-05-07 LAB — CBC
HCT: 44.9 % (ref 39.0–52.0)
Hemoglobin: 14.6 g/dL (ref 13.0–17.0)
MCH: 30.7 pg (ref 26.0–34.0)
MCHC: 32.5 g/dL (ref 30.0–36.0)
MCV: 94.3 fL (ref 80.0–100.0)
Platelets: 184 10*3/uL (ref 150–400)
RBC: 4.76 MIL/uL (ref 4.22–5.81)
RDW: 12.6 % (ref 11.5–15.5)
WBC: 4.1 10*3/uL (ref 4.0–10.5)
nRBC: 0 % (ref 0.0–0.2)

## 2023-05-07 LAB — BASIC METABOLIC PANEL
Anion gap: 10 (ref 5–15)
BUN: 15 mg/dL (ref 8–23)
CO2: 22 mmol/L (ref 22–32)
Calcium: 9 mg/dL (ref 8.9–10.3)
Chloride: 106 mmol/L (ref 98–111)
Creatinine, Ser: 0.95 mg/dL (ref 0.61–1.24)
GFR, Estimated: >60 mL/min (ref >60)
Glucose, Bld: 94 mg/dL (ref 70–99)
Potassium: 4.1 mmol/L (ref 3.5–5.1)
Sodium: 138 mmol/L (ref 135–145)

## 2023-05-07 MED ORDER — APIXABAN 5 MG PO TABS: 5.0000 mg | ORAL_TABLET | Freq: Two times a day (BID) | ORAL | 6 refills | Status: DC

## 2023-05-07 NOTE — Progress Notes (Signed)
Primary Care Physician: Sigmund Hazel, MD Primary Cardiologist: Reatha Harps, MD Electrophysiologist: Lanier Prude, MD  Referring Physician: Device clinic/Dr Blenda Nicely Eshwar Christo. is a 72 y.o. male with a history of CVA, HLD, CAD, atrial fibrillation who presents for follow up in the [facility].  The patient was initially diagnosed with atrial fibrillation 05/06/23 on his ILR which was placed for cryptogenic stroke in 2021. The episode lasted ~3 hours. Patient has a CHADS2VASC score of 4.  On follow up today, patient reports that he was asleep during the episode and was asymptomatic. He also reports that he has tried microdosing with psilocybin for drug refractory depression the two days prior to his afib episode.   Today, he denies symptoms of palpitations, chest pain, shortness of breath, orthopnea, PND, lower extremity edema, dizziness, presyncope, syncope, snoring, daytime somnolence, bleeding, or neurologic sequela. The patient is tolerating medications without difficulties and is otherwise without complaint today.    Atrial Fibrillation Risk Factors:  he does not have symptoms or diagnosis of sleep apnea. Negative sleep study per patient report he does not have a history of rheumatic fever.   Atrial Fibrillation Management history:  Previous antiarrhythmic drugs: none Previous cardioversions: none Previous ablations: none Anticoagulation history: none  ROS- All systems are reviewed and negative except as per the HPI above.  Past Medical History:  Diagnosis Date   Ascending aortic aneurysm (HCC)    03/02/18 stable CT asc aorta 41 mm, 42 mm previously. also note aortic atherosclerosis.  (per [facility] notes).   Chronic prostatitis    ELEVATED PSA . ED DR. DAHLSTEDT   Coronary artery disease    Depression    Diverticulosis 01/2017   in the sigmoid colon and in the descending colon, noted on colonoscopy. repeat in 5 yrs    Dizziness 07/2010   DR. ROSEN, NORMAL EF, MILD LEAKINESS OF MITRAL VALVE, MILD STIFFNESS OF HEART MUSCLE, MILD ENLARGEMENT OF AORTIC ROOT, REPEAT IN 1 YEAR DR. Mayford Knife, NL ETT DR. Mayford Knife 12/11   H/O: GI bleed 2002   DR. HAYES    Hyperlipidemia    Insomnia    ON Prudy Feeler, DR. MILLER   Intracerebral hematoma (HCC) 2013   [facility]   Migraine headache    Mild bipolar disorder (HCC) 01/2010   DR. PITTMAN DR. Evelene Croon, CHR DEPRESSION, FATIGUE OFF MEDS FOR AWHILE, CONTROLLED   Side effect of drug    side effects with statins   Stroke (HCC) 09/07/2019   per notes from [facility]   Tubular adenoma 09/17/2011   [facility]    Current Outpatient Medications  Medication Sig Dispense Refill   Acetylcysteine (NAC PO) Take 1,000-2,000 mg by mouth.     ALPRAZolam (XANAX) 0.5 MG tablet Take 1 mg by mouth at bedtime.   1   apixaban (ELIQUIS) 5 MG TABS tablet Take 1 tablet (5 mg total) by mouth 2 (two) times daily. 60 tablet 6   Ascorbic Acid (VITAMIN C) 1000 MG tablet Take 1,000 mg by mouth daily.     Betaine, Trimethylglycine, (TMG, TRIMETHYLGLYCINE, PO) Take 1,000 mg by mouth daily.     Cholecalciferol (VITAMIN D-3) 125 MCG (5000 UT) TABS Take 5,000 Units by mouth daily.     Coenzyme Q10 (CO Q 10) 100 MG CAPS Take 1 capsule by mouth daily.     Collagen Hydrolysate, Bovine, POWD 12 g daily.     Creatine Monohydrate POWD 5 g  daily.     hydrocortisone (ANUSOL-HC) 25 MG suppository      Magnesium 400 MG CAPS Take 1 capsule by mouth daily. With a meal     Melatonin 300 MCG TABS 600 mcg daily.     Menaquinone-7 (VITAMIN K2 PO) Take 1 tablet by mouth daily.     Niacinamide-Zn-Cu-Methfo-Se-Cr (NICOTINAMIDE PO) Take 1 tablet by mouth daily. 500-750 mg tablet     NON FORMULARY Take 10 mg by mouth daily. LITHIUM OROTATE     Omega-3 Fatty Acids (FISH OIL) 1000 MG CAPS      tadalafil (CIALIS) 5 MG tablet Take 5 mg by mouth daily as needed for erectile dysfunction.     tamsulosin  (FLOMAX) 0.4 MG CAPS capsule      traZODone (DESYREL) 50 MG tablet Take 50 mg by mouth at bedtime as needed for sleep.     TURMERIC CURCUMIN PO 665 mg daily.     ZINC PICOLINATE PO      No current facility-administered medications for this encounter.    Physical Exam: BP 118/78   Pulse 87   Ht 5\' 7"  (1.702 m)   Wt 67 kg   BMI 23.15 kg/m   GEN: Well nourished, well developed in no acute distress NECK: No JVD; No carotid bruits CARDIAC: Regular rate and rhythm, no murmurs, rubs, gallops RESPIRATORY:  Clear to auscultation without rales, wheezing or rhonchi  ABDOMEN: Soft, non-tender, non-distended EXTREMITIES:  No edema; No deformity   Wt Readings from Last 3 Encounters:  05/07/23 67 kg  02/11/23 69 kg  01/16/22 68.5 kg     EKG today demonstrates  SR Vent. rate 87 BPM PR interval 154 ms QRS duration 90 ms QT/QTcB 352/423 ms  Echo 09/08/19 demonstrated   1. Left ventricular ejection fraction, by visual estimation, is 60 to  65%. The left ventricle has normal function. There is no left ventricular  hypertrophy. Normal diastolic function   2. Global right ventricle has normal systolic function.The right  ventricular size is normal. No increase in right ventricular wall  thickness.   3. Left atrial size was normal.   4. Right atrial size was normal.   5. The mitral valve is normal in structure. Trivial mitral valve  regurgitation.   6. The tricuspid valve is normal in structure.   7. The aortic valve is tricuspid. Aortic valve regurgitation is not  visualized. No evidence of aortic valve stenosis.   8. The pulmonic valve was not well visualized. Pulmonic valve  regurgitation is not visualized.   9. TR signal is inadequate for assessing pulmonary artery systolic  pressure.  10. The inferior vena cava is dilated in size with >50% respiratory  variability, suggesting right atrial pressure of 8 mmHg.  11. There is mild dilatation of the aortic root measuring 40 mm.  12.  Saline contrast bubble study was negative, with no evidence of any  interatrial shunt.     CHA2DS2-VASc Score = 4  The patient's score is based upon: CHF History: 0 HTN History: 0 Diabetes History: 0 Stroke History: 2 Vascular Disease History: 1 (CAD on chest CT) Age Score: 1 Gender Score: 0       ASSESSMENT AND PLAN: Paroxysmal Atrial Fibrillation (ICD10:  I48.0) The patient's CHA2DS2-VASc score is 4, indicating a 4.8% annual risk of stroke.   General education about afib provided and questions answered. We also discussed his stroke risk and the risks and benefits of anticoagulation. Check bmet/cbc today Start Eliquis 5 mg BID  and stop ASA We discussed his intermittent rectal bleeding with his internal hemorrhoids. If his bleeding worsens, will need to see GI for definitive treatment.  Continue to monitor afib burden on ILR  Secondary Hypercoagulable State (ICD10:  D68.69) The patient is at significant risk for stroke/thromboembolism based upon his CHA2DS2-VASc Score of 4.  Start Apixaban (Eliquis).    Follow up in the AF clinic in one month.       Jorja Loa PA-C Afib Clinic [facility] [address] [place], [place] [address] [phone]

## 2023-05-07 NOTE — Patient Instructions (Signed)
Stop Aspirin 81mg    Start Eliquis 5mg  twice daily

## 2023-05-18 ENCOUNTER — Telehealth (HOSPITAL_COMMUNITY): Payer: Self-pay | Admitting: *Deleted

## 2023-05-18 NOTE — Telephone Encounter (Signed)
Patient called in stating he started having left sided pain to his back since Sunday. Having to take tylenol for pain and now notices raised red areas. They area does not itch. Wonders if related to Eliquis. Discussed with Jorja Loa PA - states symptoms maybe related to shingles - recommended follow up with PCP office for assessment. Pt verbalized agreement.

## 2023-05-19 DIAGNOSIS — Z6823 Body mass index (BMI) 23.0-23.9, adult: Secondary | ICD-10-CM | POA: Diagnosis not present

## 2023-05-19 DIAGNOSIS — B029 Zoster without complications: Secondary | ICD-10-CM | POA: Diagnosis not present

## 2023-05-26 LAB — CUP PACEART REMOTE DEVICE CHECK
Date Time Interrogation Session: 20240923230042
Implantable Pulse Generator Implant Date: 20210111

## 2023-05-31 ENCOUNTER — Ambulatory Visit (INDEPENDENT_AMBULATORY_CARE_PROVIDER_SITE_OTHER): Payer: Medicare HMO

## 2023-05-31 DIAGNOSIS — I639 Cerebral infarction, unspecified: Secondary | ICD-10-CM

## 2023-06-03 ENCOUNTER — Telehealth (HOSPITAL_COMMUNITY): Payer: Self-pay | Admitting: *Deleted

## 2023-06-03 MED ORDER — RIVAROXABAN 20 MG PO TABS: 20.0000 mg | ORAL_TABLET | Freq: Every day | ORAL | 3 refills | Status: DC

## 2023-06-03 NOTE — Telephone Encounter (Signed)
Patient called in stating he feels muscle weakness "wasting" since starting Eliquis. He is concerned and would prefer to switch medications. Discussed with Jorja Loa PA will stop Eliquis and start Xarelto 20mg  once a day. Follow up as scheduled.

## 2023-06-07 ENCOUNTER — Ambulatory Visit (HOSPITAL_COMMUNITY)
Admission: RE | Admit: 2023-06-07 | Discharge: 2023-06-07 | Disposition: A | Discharge status: Home / Self Care | Payer: Medicare HMO | Source: Ambulatory Visit | Attending: Physician Assistant | Admitting: Physician Assistant

## 2023-06-07 ENCOUNTER — Encounter (HOSPITAL_COMMUNITY): Payer: Self-pay | Admitting: Physician Assistant

## 2023-06-07 VITALS — BP 114/82 | HR 89 | Ht 67.0 in | Wt 147.2 lb

## 2023-06-07 DIAGNOSIS — I2510 Atherosclerotic heart disease of native coronary artery without angina pectoris: Secondary | ICD-10-CM | POA: Insufficient documentation

## 2023-06-07 DIAGNOSIS — I480 Paroxysmal atrial fibrillation: Secondary | ICD-10-CM | POA: Diagnosis not present

## 2023-06-07 DIAGNOSIS — Z8673 Personal history of transient ischemic attack (TIA), and cerebral infarction without residual deficits: Secondary | ICD-10-CM | POA: Diagnosis not present

## 2023-06-07 DIAGNOSIS — D6869 Other thrombophilia: Secondary | ICD-10-CM | POA: Insufficient documentation

## 2023-06-07 DIAGNOSIS — Z7901 Long term (current) use of anticoagulants: Secondary | ICD-10-CM | POA: Insufficient documentation

## 2023-06-07 DIAGNOSIS — E785 Hyperlipidemia, unspecified: Secondary | ICD-10-CM | POA: Insufficient documentation

## 2023-06-07 LAB — CBC
HCT: 43.7 % (ref 39.0–52.0)
Hemoglobin: 14.4 g/dL (ref 13.0–17.0)
MCH: 31 pg (ref 26.0–34.0)
MCHC: 33 g/dL (ref 30.0–36.0)
MCV: 94.2 fL (ref 80.0–100.0)
Platelets: 217 10*3/uL (ref 150–400)
RBC: 4.64 MIL/uL (ref 4.22–5.81)
RDW: 13.3 % (ref 11.5–15.5)
WBC: 4.2 10*3/uL (ref 4.0–10.5)
nRBC: 0 % (ref 0.0–0.2)

## 2023-06-07 NOTE — Progress Notes (Signed)
Primary Care Physician: Sigmund Hazel, MD Primary Cardiologist: Reatha Harps, MD Electrophysiologist: Lanier Prude, MD  Referring Physician: Device clinic/Dr Blenda Nicely Tarell Schollmeyer. is a 72 y.o. male with a history of CVA, HLD, CAD, atrial fibrillation who presents for follow up in the [facility].  The patient was initially diagnosed with atrial fibrillation 05/06/23 on his ILR which was placed for cryptogenic stroke in 2021. The episode lasted ~3 hours. Patient has a CHADS2VASC score of 4.  On follow up today, patient did not tolerate Eliquis due to fatigue and subjective "muscle wasting". He has been changed to Xarelto and is doing better. ILR shows no interim afib episodes.   Today, he denies symptoms of palpitations, chest pain, shortness of breath, orthopnea, PND, lower extremity edema, dizziness, presyncope, syncope, snoring, daytime somnolence, bleeding, or neurologic sequela. The patient is tolerating medications without difficulties and is otherwise without complaint today.    Atrial Fibrillation Risk Factors:  he does not have symptoms or diagnosis of sleep apnea. Negative sleep study per patient report he does not have a history of rheumatic fever.   Atrial Fibrillation Management history:  Previous antiarrhythmic drugs: none Previous cardioversions: none Previous ablations: none Anticoagulation history: Eliquis, Xarelto   ROS- All systems are reviewed and negative except as per the HPI above.  Past Medical History:  Diagnosis Date   Ascending aortic aneurysm (HCC)    03/02/18 stable CT asc aorta 41 mm, 42 mm previously. also note aortic atherosclerosis.  (per [facility] notes).   Chronic prostatitis    ELEVATED PSA . ED DR. DAHLSTEDT   Coronary artery disease    Depression    Diverticulosis 01/2017   in the sigmoid colon and in the descending colon, noted on colonoscopy. repeat in 5 yrs   Dizziness 07/2010   DR. ROSEN,  NORMAL EF, MILD LEAKINESS OF MITRAL VALVE, MILD STIFFNESS OF HEART MUSCLE, MILD ENLARGEMENT OF AORTIC ROOT, REPEAT IN 1 YEAR DR. Mayford Knife, NL ETT DR. Mayford Knife 12/11   H/O: GI bleed 2002   DR. HAYES    Hyperlipidemia    Insomnia    ON Prudy Feeler, DR. MILLER   Intracerebral hematoma (HCC) 2013   [facility]   Migraine headache    Mild bipolar disorder (HCC) 01/2010   DR. PITTMAN DR. Evelene Croon, CHR DEPRESSION, FATIGUE OFF MEDS FOR AWHILE, CONTROLLED   Side effect of drug    side effects with statins   Stroke (HCC) 09/07/2019   per notes from [facility]   Tubular adenoma 09/17/2011   [facility]    Current Outpatient Medications  Medication Sig Dispense Refill   Acetylcysteine (NAC PO) Take 1,000-2,000 mg by mouth.     ALPRAZolam (XANAX) 0.5 MG tablet Take 1 mg by mouth at bedtime.   1   Ascorbic Acid (VITAMIN C) 1000 MG tablet Take 1,000 mg by mouth daily.     Betaine, Trimethylglycine, (TMG, TRIMETHYLGLYCINE, PO) Take 1,000 mg by mouth daily.     Cholecalciferol (VITAMIN D-3) 125 MCG (5000 UT) TABS Take 5,000 Units by mouth daily.     Coenzyme Q10 (CO Q 10) 100 MG CAPS Take 1 capsule by mouth daily.     Collagen Hydrolysate, Bovine, POWD 12 g daily.     Creatine Monohydrate POWD 5 g daily.     hydrocortisone (ANUSOL-HC) 25 MG suppository      Magnesium 400 MG CAPS Take 1 capsule by mouth daily. With a  meal     Melatonin 300 MCG TABS 600 mcg daily.     Menaquinone-7 (VITAMIN K2 PO) Take 1 tablet by mouth daily.     Niacinamide-Zn-Cu-Methfo-Se-Cr (NICOTINAMIDE PO) Take 1 tablet by mouth daily. 500-750 mg tablet     NON FORMULARY Take 10 mg by mouth daily. LITHIUM OROTATE     Omega-3 Fatty Acids (FISH OIL) 1000 MG CAPS      rivaroxaban (XARELTO) 20 MG TABS tablet Take 1 tablet (20 mg total) by mouth daily with supper. 30 tablet 3   tadalafil (CIALIS) 5 MG tablet Take 5 mg by mouth daily as needed for erectile dysfunction.     tamsulosin (FLOMAX) 0.4 MG CAPS capsule       traZODone (DESYREL) 50 MG tablet Take 50 mg by mouth at bedtime as needed for sleep.     TURMERIC CURCUMIN PO 665 mg daily.     ZINC PICOLINATE PO      No current facility-administered medications for this encounter.    Physical Exam: BP 114/82   Pulse 89   Ht 5\' 7"  (1.702 m)   Wt 66.8 kg   BMI 23.05 kg/m   GEN: Well nourished, well developed in no acute distress NECK: No JVD; No carotid bruits CARDIAC: Regular rate and rhythm, no murmurs, rubs, gallops RESPIRATORY:  Clear to auscultation without rales, wheezing or rhonchi  ABDOMEN: Soft, non-tender, non-distended EXTREMITIES:  No edema; No deformity    Wt Readings from Last 3 Encounters:  06/07/23 66.8 kg  05/07/23 67 kg  02/11/23 69 kg     EKG today demonstrates  SR Vent. rate 89 BPM PR interval 154 ms QRS duration 84 ms QT/QTcB 354/430 ms  Echo 09/08/19 demonstrated   1. Left ventricular ejection fraction, by visual estimation, is 60 to  65%. The left ventricle has normal function. There is no left ventricular  hypertrophy. Normal diastolic function   2. Global right ventricle has normal systolic function.The right  ventricular size is normal. No increase in right ventricular wall  thickness.   3. Left atrial size was normal.   4. Right atrial size was normal.   5. The mitral valve is normal in structure. Trivial mitral valve  regurgitation.   6. The tricuspid valve is normal in structure.   7. The aortic valve is tricuspid. Aortic valve regurgitation is not  visualized. No evidence of aortic valve stenosis.   8. The pulmonic valve was not well visualized. Pulmonic valve  regurgitation is not visualized.   9. TR signal is inadequate for assessing pulmonary artery systolic  pressure.  10. The inferior vena cava is dilated in size with >50% respiratory  variability, suggesting right atrial pressure of 8 mmHg.  11. There is mild dilatation of the aortic root measuring 40 mm.  12. Saline contrast bubble  study was negative, with no evidence of any  interatrial shunt.     CHA2DS2-VASc Score = 4  The patient's score is based upon: CHF History: 0 HTN History: 0 Diabetes History: 0 Stroke History: 2 Vascular Disease History: 1 (CAD on chest CT) Age Score: 1 Gender Score: 0       ASSESSMENT AND PLAN: Paroxysmal Atrial Fibrillation (ICD10:  I48.0) The patient's CHA2DS2-VASc score is 4, indicating a 4.8% annual risk of stroke.   ILR shows 0% afib burden with no interim episodes.  Check cbc today Continue Xarelto 20 mg daily  Secondary Hypercoagulable State (ICD10:  D68.69) The patient is at significant risk for stroke/thromboembolism  based upon his CHA2DS2-VASc Score of 4.  Continue Rivaroxaban (Xarelto). Patient inquires about Watchman today. He is concerned about his bleeding risk. He will occasionally have hemorrhoidal bleeding. Brochure given, will refer to [facility] team.    Follow up with Dr Lalla Brothers to discuss Watchman.       Jorja Loa PA-C Afib Clinic [facility] [address] [place], [place] [address] [phone]

## 2023-06-10 NOTE — Progress Notes (Signed)
Electrophysiology Office Note:   Date:  06/11/2023  ID:  Bryce Logan., DOB April 12, 1951, MRN [id number]  Primary Cardiologist: Reatha Harps, MD Electrophysiologist: Lanier Prude, MD      History of Present Illness:   Bryce Terrio. is a 72 y.o. male with h/o CVA, hyperlipidemia, coronary artery disease and paroxysmal atrial fibrillation who is being seen today for evaluation of Watchman device implant at the request of Clint R. Fenton, [place].  The patient was initially diagnosed with atrial fibrillation 05/06/23 on his ILR which was placed for cryptogenic stroke in 2021. The episode lasted ~3 hours.  After this episode he was placed on Eliquis.  Patient reports intolerance to Eliquis.  He was then changed to Xarelto, which he thinks he is tolerating better with no obvious side effects.  He has a history of a severe GI bleed 23 year ago, during which he states that he "almost bled out". He also has intermittent hemorrhoidal bleeding, but nothing severe currently. He only knows of his one episode of atrial fibrillation, which was self-limiting. He does reports being symptomatic with palpitations during this episode.   Review of systems complete and found to be negative unless listed in HPI.   EP Information / Studies Reviewed:    EKG is not ordered today. EKG from 06/07/23 reviewed which showed normal sinus rhythm.     Echo TEE 09/12/2019: Normal LV function.  LVEF 55 to 60%. Normal RV size and function. Normal left atrial and right atrial size. No significant valvular disease.  Risk Assessment/Calculations:    CHA2DS2-VASc Score = 4   This indicates a 4.8% annual risk of stroke. The patient's score is based upon: CHF History: 0 HTN History: 0 Diabetes History: 0 Stroke History: 2 Vascular Disease History: 1 (CAD on chest CT) Age Score: 1 Gender Score: 0             Physical Exam:   VS:  BP 124/72   Pulse 73   Ht 5\' 7"  (1.702 m)   Wt 150 lb 12.8 oz (68.4 kg)   SpO2 98%    BMI 23.62 kg/m    Wt Readings from Last 3 Encounters:  06/11/23 150 lb 12.8 oz (68.4 kg)  06/07/23 147 lb 3.2 oz (66.8 kg)  05/07/23 147 lb 12.8 oz (67 kg)     GEN: Well nourished, well developed in no acute distress NECK: No JVD; No carotid bruits CARDIAC: Regular rate and rhythm, no murmurs, rubs, gallops RESPIRATORY:  Clear to auscultation without rales, wheezing or rhonchi  ABDOMEN: Soft, non-tender, non-distended EXTREMITIES:  No edema; No deformity   ASSESSMENT:   [name] is a 72 y.o. male with h/o CVA, hyperlipidemia, coronary artery disease and paroxysmal atrial fibrillation who is being seen today for evaluation of Watchman device implant at the request of Clint R. Fenton, [place].  I have seen Bryce Logan. in the office today who is being considered for a Watchman left atrial appendage closure device. I believe that he would be a suitable candidate for this procedure given his history of atrial fibrillation, CHA2DS2-VASc score of 4 and unadjusted ischemic stroke rate of 4.8% per year. The patient has history of GI bleeding and reported intolerance to Eliquis. He is hesitant to take oral anti-coagulation long-term.   It is my belief that after undergoing a LAA closure procedure, Bryce Logan. will not need long term anticoagulation which eliminates anticoagulation side effects and major bleeding  risk.   Procedural risks for the Watchman implant have been reviewed with the patient including a 0.5% risk of stroke, <1% risk of perforation and <1% risk of device embolization. Other risks include bleeding, vascular damage, tamponade, worsening renal function, and death. The patient understands these risk and wishes to proceed.    The published clinical data on the safety and effectiveness of WATCHMAN include but are not limited to the following: - Holmes DR, Everlene Farrier, Sick P et al. for the PROTECT AF Investigators. Percutaneous closure of the left atrial appendage versus  warfarin therapy for prevention of stroke in patients with atrial fibrillation: a randomised non-inferiority trial. Lancet 2009; 374: 534-42. Everlene Farrier, Doshi SK, Isa Rankin D et al. on behalf of the PROTECT AF Investigators. Percutaneous Left Atrial Appendage Closure for Stroke Prophylaxis in Patients With Atrial Fibrillation 2.3-Year Follow-up of the PROTECT AF (Watchman Left Atrial Appendage System for Embolic Protection in Patients With Atrial Fibrillation) Trial. Circulation 2013; 127:720-729. - Alli O, Doshi S,  Kar S, Reddy VY, Sievert H et al. Quality of Life Assessment in the Randomized PROTECT AF (Percutaneous Closure of the Left Atrial Appendage Versus Warfarin Therapy for Prevention of Stroke in Patients With Atrial Fibrillation) Trial of Patients at Risk for Stroke With Nonvalvular Atrial Fibrillation. J Am Coll Cardiol 2013; 61:1790-8. Aline August DR, Mia [place], Price M, Whisenant B, Sievert H, Doshi S, Huber K, Reddy V. Prospective randomized evaluation of the Watchman left atrial appendage Device in patients with atrial fibrillation versus long-term warfarin therapy; the PREVAIL trial. Journal of the [employer] of Cardiology, Vol. 4, No. 1, 2014, 1-11. - Kar S, Doshi SK, Sadhu A, Horton R, Osorio J et al. Primary outcome evaluation of a next-generation left atrial appendage closure device: results from the PINNACLE FLX trial. Circulation 2021;143(18)1754-1762.   PLAN: Symptomatic paroxysmal Atrial Fibrillation (ICD10:  I48.0) The patient's CHA2DS2-VASc score is 4, indicating a 4.8% annual risk of stroke.   He only has one documented episode. I suspect that he will have more in the future. We will continue monitoring with his ILR for now.  If he has more symptomatic episodes, then I believe he would be an appropriate candidate for ablation, which could possibly be performed concomitantly with Watchman implant.   Secondary Hypercoagulable State (ICD10:  D68.69) The patient is at  significant risk for stroke/thromboembolism based upon his CHA2DS2-VASc Score of 4.   Discussed risks and benefits of Watchman implant as above. He will continue Rivaroxaban (Xarelto) for now, which he is tolerating, albeit recently started. If he has intolerance, then we will pursue Watchman device.   Follow up with Dr. Jimmey Ralph in 6 months  Total time of encounter: 81 minutes total time of encounter, including chart review, face-to-face patient care, coordination of care and counseling regarding high complexity medical decision making.  Nobie Putnam, MD 06/11/2023 5:05 PM

## 2023-06-11 ENCOUNTER — Ambulatory Visit: Payer: Medicare HMO | Attending: Cardiology | Admitting: Cardiology

## 2023-06-11 ENCOUNTER — Encounter: Payer: Self-pay | Admitting: Cardiology

## 2023-06-11 VITALS — BP 124/72 | HR 73 | Ht 67.0 in | Wt 150.8 lb

## 2023-06-11 DIAGNOSIS — I639 Cerebral infarction, unspecified: Secondary | ICD-10-CM | POA: Diagnosis not present

## 2023-06-11 DIAGNOSIS — I480 Paroxysmal atrial fibrillation: Secondary | ICD-10-CM

## 2023-06-11 DIAGNOSIS — D6869 Other thrombophilia: Secondary | ICD-10-CM | POA: Diagnosis not present

## 2023-06-11 NOTE — Patient Instructions (Signed)
Medication Instructions:  Your physician recommends that you continue on your current medications as directed. Please refer to the Current Medication list given to you today.  *If you need a refill on your cardiac medications before your next appointment, please call your pharmacy*  Follow-Up: At [facility], you and your health needs are our priority.  As part of our continuing mission to provide you with exceptional heart care, we have created designated Provider Care Teams.  These Care Teams include your primary Cardiologist (physician) and Advanced Practice Providers (APPs -  Physician Assistants and Nurse Practitioners) who all work together to provide you with the care you need, when you need it.  Your next appointment:   6 months  Provider:   Nobie Putnam, MD

## 2023-06-14 NOTE — Progress Notes (Signed)
Carelink Summary Report / Loop Recorder 

## 2023-06-29 LAB — CUP PACEART REMOTE DEVICE CHECK
Date Time Interrogation Session: 20241026230215
Implantable Pulse Generator Implant Date: 20210111

## 2023-07-05 ENCOUNTER — Ambulatory Visit (INDEPENDENT_AMBULATORY_CARE_PROVIDER_SITE_OTHER): Payer: Medicare HMO

## 2023-07-05 DIAGNOSIS — I639 Cerebral infarction, unspecified: Secondary | ICD-10-CM | POA: Diagnosis not present

## 2023-07-21 DIAGNOSIS — R1012 Left upper quadrant pain: Secondary | ICD-10-CM | POA: Diagnosis not present

## 2023-07-21 DIAGNOSIS — I77810 Thoracic aortic ectasia: Secondary | ICD-10-CM | POA: Diagnosis not present

## 2023-07-21 DIAGNOSIS — Z6822 Body mass index (BMI) 22.0-22.9, adult: Secondary | ICD-10-CM | POA: Diagnosis not present

## 2023-07-21 DIAGNOSIS — Z23 Encounter for immunization: Secondary | ICD-10-CM | POA: Diagnosis not present

## 2023-07-21 DIAGNOSIS — D6869 Other thrombophilia: Secondary | ICD-10-CM | POA: Diagnosis not present

## 2023-07-21 DIAGNOSIS — I480 Paroxysmal atrial fibrillation: Secondary | ICD-10-CM | POA: Diagnosis not present

## 2023-07-27 NOTE — Progress Notes (Signed)
Carelink Summary Report / Loop Recorder 

## 2023-07-28 DIAGNOSIS — Z1331 Encounter for screening for depression: Secondary | ICD-10-CM | POA: Diagnosis not present

## 2023-07-28 DIAGNOSIS — Z0000 Encounter for general adult medical examination without abnormal findings: Secondary | ICD-10-CM | POA: Diagnosis not present

## 2023-07-28 DIAGNOSIS — Z6822 Body mass index (BMI) 22.0-22.9, adult: Secondary | ICD-10-CM | POA: Diagnosis not present

## 2023-08-02 DIAGNOSIS — I480 Paroxysmal atrial fibrillation: Secondary | ICD-10-CM | POA: Diagnosis not present

## 2023-08-02 DIAGNOSIS — E673 Hypervitaminosis D: Secondary | ICD-10-CM | POA: Diagnosis not present

## 2023-08-02 DIAGNOSIS — Z6822 Body mass index (BMI) 22.0-22.9, adult: Secondary | ICD-10-CM | POA: Diagnosis not present

## 2023-08-02 DIAGNOSIS — Z125 Encounter for screening for malignant neoplasm of prostate: Secondary | ICD-10-CM | POA: Diagnosis not present

## 2023-08-02 DIAGNOSIS — Z8673 Personal history of transient ischemic attack (TIA), and cerebral infarction without residual deficits: Secondary | ICD-10-CM | POA: Diagnosis not present

## 2023-08-02 DIAGNOSIS — E785 Hyperlipidemia, unspecified: Secondary | ICD-10-CM | POA: Diagnosis not present

## 2023-08-04 ENCOUNTER — Telehealth: Payer: Self-pay | Admitting: Cardiovascular Disease

## 2023-08-04 ENCOUNTER — Encounter: Payer: Self-pay | Admitting: Cardiovascular Disease

## 2023-08-04 DIAGNOSIS — H2513 Age-related nuclear cataract, bilateral: Secondary | ICD-10-CM | POA: Diagnosis not present

## 2023-08-04 DIAGNOSIS — H5203 Hypermetropia, bilateral: Secondary | ICD-10-CM | POA: Diagnosis not present

## 2023-08-04 DIAGNOSIS — H52223 Regular astigmatism, bilateral: Secondary | ICD-10-CM | POA: Diagnosis not present

## 2023-08-04 DIAGNOSIS — H524 Presbyopia: Secondary | ICD-10-CM | POA: Diagnosis not present

## 2023-08-04 DIAGNOSIS — E782 Mixed hyperlipidemia: Secondary | ICD-10-CM

## 2023-08-04 DIAGNOSIS — I639 Cerebral infarction, unspecified: Secondary | ICD-10-CM

## 2023-08-04 DIAGNOSIS — Z135 Encounter for screening for eye and ear disorders: Secondary | ICD-10-CM | POA: Diagnosis not present

## 2023-08-04 NOTE — Telephone Encounter (Signed)
Patient called to follow up on the MyChart message he sent today regarding taking Ezetimibe again.  Patient stated can respond to his MyChart message.

## 2023-08-04 NOTE — Telephone Encounter (Signed)
Left VM on pt's [employer] number.  His My Chart msg was routed to Pharm D and Dr. Scharlene Gloss.  He would like to try Ezetimibe again and thinks the hand pain he had previously may not have been from this medication.

## 2023-08-05 DIAGNOSIS — L570 Actinic keratosis: Secondary | ICD-10-CM | POA: Diagnosis not present

## 2023-08-09 ENCOUNTER — Ambulatory Visit (INDEPENDENT_AMBULATORY_CARE_PROVIDER_SITE_OTHER): Payer: Medicare HMO

## 2023-08-09 DIAGNOSIS — I639 Cerebral infarction, unspecified: Secondary | ICD-10-CM

## 2023-08-09 LAB — CUP PACEART REMOTE DEVICE CHECK
Date Time Interrogation Session: 20241208230247
Implantable Pulse Generator Implant Date: 20210111

## 2023-08-11 DIAGNOSIS — N401 Enlarged prostate with lower urinary tract symptoms: Secondary | ICD-10-CM | POA: Diagnosis not present

## 2023-08-11 DIAGNOSIS — R102 Pelvic and perineal pain: Secondary | ICD-10-CM | POA: Diagnosis not present

## 2023-08-11 DIAGNOSIS — N5201 Erectile dysfunction due to arterial insufficiency: Secondary | ICD-10-CM | POA: Diagnosis not present

## 2023-08-11 DIAGNOSIS — R3912 Poor urinary stream: Secondary | ICD-10-CM | POA: Diagnosis not present

## 2023-08-16 DIAGNOSIS — H524 Presbyopia: Secondary | ICD-10-CM | POA: Diagnosis not present

## 2023-08-16 DIAGNOSIS — H52209 Unspecified astigmatism, unspecified eye: Secondary | ICD-10-CM | POA: Diagnosis not present

## 2023-08-16 DIAGNOSIS — H5203 Hypermetropia, bilateral: Secondary | ICD-10-CM | POA: Diagnosis not present

## 2023-08-17 ENCOUNTER — Other Ambulatory Visit (HOSPITAL_COMMUNITY): Payer: Self-pay

## 2023-08-17 ENCOUNTER — Telehealth (HOSPITAL_BASED_OUTPATIENT_CLINIC_OR_DEPARTMENT_OTHER): Payer: Self-pay | Admitting: Pharmacy Technician

## 2023-08-17 NOTE — Telephone Encounter (Signed)
Pharmacy Patient Advocate Encounter  Received notification from [facility] that Prior Authorization for repatha has been APPROVED from 08/31/22 to 08/30/24. Ran test claim, Copay is $45.00 one month. This test claim was processed through [facility]- copay amounts may vary at other pharmacies due to pharmacy/plan contracts, or as the patient moves through the different stages of their insurance plan.   PA #/Case ID/Reference #: [id number]

## 2023-08-17 NOTE — Telephone Encounter (Signed)
Pharmacy Patient Advocate Encounter   Received notification from Physician's Office that prior authorization for repatha is required/requested.   Insurance verification completed.   The patient is insured through [employer] .   Per test claim: PA required; PA submitted to above mentioned insurance via CoverMyMeds Key/confirmation #/EOC [id number] Status is pending

## 2023-08-18 DIAGNOSIS — K7689 Other specified diseases of liver: Secondary | ICD-10-CM | POA: Diagnosis not present

## 2023-08-18 DIAGNOSIS — R1012 Left upper quadrant pain: Secondary | ICD-10-CM | POA: Diagnosis not present

## 2023-08-18 DIAGNOSIS — R160 Hepatomegaly, not elsewhere classified: Secondary | ICD-10-CM | POA: Diagnosis not present

## 2023-08-18 DIAGNOSIS — K824 Cholesterolosis of gallbladder: Secondary | ICD-10-CM | POA: Diagnosis not present

## 2023-08-18 MED ORDER — REPATHA SURECLICK 140 MG/ML ~~LOC~~ SOAJ: 140.0000 mg | SUBCUTANEOUS | 11 refills | Status: DC

## 2023-08-18 NOTE — Addendum Note (Signed)
Addended by: Malena Peer D on: 08/18/2023 12:09 PM   Modules accepted: Orders

## 2023-08-27 ENCOUNTER — Telehealth: Payer: Self-pay | Admitting: Cardiovascular Disease

## 2023-08-27 NOTE — Telephone Encounter (Signed)
  Pt c/o medication issue:  1. Name of Medication: Repatha  2. How are you currently taking this medication (dosage and times per day)? As written  3. Are you having a reaction (difficulty breathing--STAT)? no  4. What is your medication issue? Pt stated that he had an ultrasound at [facility], which revealed a mass measuring 1.2  1.4 centimeters. He needs to follow up for an MRI to determine what it is. He would like to ask Dr. Flora Lipps if he should start taking Repatha as planned or wait until he receives the results of his liver mass.

## 2023-08-27 NOTE — Telephone Encounter (Signed)
Sande Rives, MD  You35 minutes ago (2:10 PM)    No need to wait unless the patient prefers to wait.  Gerri Spore T. Flora Lipps, MD, [facility]  [facility] [address] [place], [place] [address] [phone] 2:10 PM

## 2023-08-27 NOTE — Telephone Encounter (Signed)
Patient identification verified by 2 forms. Marilynn Rail, RN    Called and spoke to patient  Relayed provider message below  Patient states he will pick up RX and begin injections  Patient verbalized understanding, no questions at this time

## 2023-08-31 ENCOUNTER — Telehealth: Payer: Self-pay | Admitting: Cardiovascular Disease

## 2023-08-31 NOTE — Telephone Encounter (Signed)
 Called and spoke to patient. Below message relayed. Patient verbalized understanding and agree to monitor.    Not related to repatha . Sounds like dry mouth. Would monitor to see if it happens again.  Darryle T. Barbaraann, MD, [facility]  [facility] [address] [place], [place] [address] [phone] 1:31 PM

## 2023-08-31 NOTE — Telephone Encounter (Signed)
 Called and spoke to patient. Verified name and DOB. Patient reports he started taking Repatha  on Saturday. He stated he woke up around 4:30 am this morning and he was unable to swallow his saliva. He was able to drink water without difficulty and has not anymore trouble with swallowing  He also report that he had been off Flomax for one week because it makes him dizzy and started back taking it last night. He deny any other symptoms at this time. Patient asked if it could be the Repatha  or the fact that he restarted the Flomax. Also asked if he should continue taking Flomax. I advised him that was a question for his urologist. Please advise.

## 2023-08-31 NOTE — Telephone Encounter (Signed)
 Pt c/o medication issue:  1. Name of Medication: Evolocumab  (REPATHA  SURECLICK) 140 MG/ML SOAJ   2. How are you currently taking this medication (dosage and times per day)? Inject 140 mg into the skin every 14 (fourteen) days.   3. Are you having a reaction (difficulty breathing--STAT)? No   4. What is your medication issue? Patient started on Repatha  on Saturday and said he could not swallow. Said he drunk a water and was able to swallow that

## 2023-09-02 ENCOUNTER — Other Ambulatory Visit: Payer: Self-pay | Admitting: Family Medicine

## 2023-09-02 DIAGNOSIS — R160 Hepatomegaly, not elsewhere classified: Secondary | ICD-10-CM

## 2023-09-13 ENCOUNTER — Ambulatory Visit (INDEPENDENT_AMBULATORY_CARE_PROVIDER_SITE_OTHER): Payer: Medicare Other

## 2023-09-13 DIAGNOSIS — I639 Cerebral infarction, unspecified: Secondary | ICD-10-CM | POA: Diagnosis not present

## 2023-09-13 LAB — CUP PACEART REMOTE DEVICE CHECK
Date Time Interrogation Session: 20250112230230
Implantable Pulse Generator Implant Date: 20210111

## 2023-09-14 ENCOUNTER — Telehealth: Payer: Self-pay

## 2023-09-14 NOTE — Telephone Encounter (Signed)
 Called to schedule 6 month appointment with Dr. Kennyth. He declined to schedule and stated he is not interested in [place]. He understood to continue regular Cardiology follow-up and to call if he changes his mind. He was grateful for call and agreed with plan.

## 2023-09-16 ENCOUNTER — Ambulatory Visit
Admission: RE | Admit: 2023-09-16 | Discharge: 2023-09-16 | Disposition: A | Discharge status: Home / Self Care | Payer: Medicare Other | Source: Ambulatory Visit | Attending: Family Medicine | Admitting: Family Medicine

## 2023-09-16 DIAGNOSIS — K7689 Other specified diseases of liver: Secondary | ICD-10-CM | POA: Diagnosis not present

## 2023-09-16 DIAGNOSIS — R160 Hepatomegaly, not elsewhere classified: Secondary | ICD-10-CM

## 2023-09-16 MED ORDER — GADOPICLENOL 0.5 MMOL/ML IV SOLN
7.0000 mL | Freq: Once | INTRAVENOUS | Status: AC | PRN
  Administered 2023-09-16: 7 mL via INTRAVENOUS

## 2023-09-16 NOTE — Progress Notes (Signed)
Carelink Summary Report / Loop Recorder 

## 2023-09-29 ENCOUNTER — Telehealth (HOSPITAL_COMMUNITY): Payer: Self-pay

## 2023-09-29 MED ORDER — APIXABAN 5 MG PO TABS: 5.0000 mg | ORAL_TABLET | Freq: Two times a day (BID) | ORAL | 6 refills | Status: DC

## 2023-09-29 NOTE — Telephone Encounter (Signed)
Patient called in to let us know he has been having rectal bleeding at least 1-2 times weekly. The color is bright red. At times can be small amounts of blood or just spotting. He is concerned this may be related to his hemorrhoids. Per Clint Fenton-PA Instruct patient to reach out to his GI doctor regarding the bleeding he is having. Patient mentioned about changing his blood thinner from Xarelto to Eliquis. He is concerned about this medication causing him to have decreased libido. Per Clint Fenton- PA this medication should not cause decrease in Libido. Clint Fenton-PA agreed to patient discontinuing his Xarelto medication. He will start Eliquis 5 mg- Taking 1 tablet twice daily. Advised patient to not take the Xarelto tonight and start his Eliquis medication dose tonight. Notified pharmacy to discontinue his Xarelto. Erxed Eliquis 5 mg tablet to his pharmacy, 30 tablets with 6 refills. Consulted with patient and he verbalized understanding.

## 2023-09-30 DIAGNOSIS — E673 Hypervitaminosis D: Secondary | ICD-10-CM | POA: Diagnosis not present

## 2023-10-01 ENCOUNTER — Telehealth (HOSPITAL_COMMUNITY): Payer: Self-pay

## 2023-10-01 MED ORDER — RIVAROXABAN 20 MG PO TABS: 20.0000 mg | ORAL_TABLET | Freq: Every day | ORAL | 6 refills | Status: DC

## 2023-10-01 NOTE — Telephone Encounter (Signed)
Patient called regarding his Eliquis medication. He states this medication caused his whole body to feel weak especially his hands. Yesterday patient states he had taken his Eliquis 5 mg tablet yesterday at 12 pm and then switched to Xarelto 20 mg tablet at 12 am last night. Patient has not taken the Eliquis this morning. Per Clint Fenton-PA instruct patient to take his Xarelto 20 mg tablet medication tonight and he was advised to make sure he takes this medication with a big meal such as supper. Notified the pharmacy to discontinue his Eliquis medication. Sent in Xarelto 20 mg tablet medication quantity 30 day supply with 6 refills. Consulted with patient and he verbalized understanding.

## 2023-10-07 DIAGNOSIS — L570 Actinic keratosis: Secondary | ICD-10-CM | POA: Diagnosis not present

## 2023-10-07 DIAGNOSIS — B078 Other viral warts: Secondary | ICD-10-CM | POA: Diagnosis not present

## 2023-10-11 DIAGNOSIS — R102 Pelvic and perineal pain: Secondary | ICD-10-CM | POA: Diagnosis not present

## 2023-10-11 DIAGNOSIS — M62838 Other muscle spasm: Secondary | ICD-10-CM | POA: Diagnosis not present

## 2023-10-11 DIAGNOSIS — N5201 Erectile dysfunction due to arterial insufficiency: Secondary | ICD-10-CM | POA: Diagnosis not present

## 2023-10-18 ENCOUNTER — Ambulatory Visit (INDEPENDENT_AMBULATORY_CARE_PROVIDER_SITE_OTHER): Payer: Medicare HMO

## 2023-10-18 DIAGNOSIS — I639 Cerebral infarction, unspecified: Secondary | ICD-10-CM | POA: Diagnosis not present

## 2023-10-19 LAB — CUP PACEART REMOTE DEVICE CHECK
Date Time Interrogation Session: 20250216230137
Implantable Pulse Generator Implant Date: 20210111

## 2023-10-20 ENCOUNTER — Encounter: Payer: Self-pay | Admitting: Cardiology

## 2023-10-20 DIAGNOSIS — Z6823 Body mass index (BMI) 23.0-23.9, adult: Secondary | ICD-10-CM | POA: Diagnosis not present

## 2023-10-20 DIAGNOSIS — K625 Hemorrhage of anus and rectum: Secondary | ICD-10-CM | POA: Diagnosis not present

## 2023-10-26 NOTE — Progress Notes (Signed)
 Carelink Summary Report / Loop Recorder

## 2023-11-01 DIAGNOSIS — K625 Hemorrhage of anus and rectum: Secondary | ICD-10-CM | POA: Diagnosis not present

## 2023-11-17 DIAGNOSIS — K625 Hemorrhage of anus and rectum: Secondary | ICD-10-CM | POA: Diagnosis not present

## 2023-11-17 DIAGNOSIS — D6869 Other thrombophilia: Secondary | ICD-10-CM | POA: Diagnosis not present

## 2023-11-17 DIAGNOSIS — I480 Paroxysmal atrial fibrillation: Secondary | ICD-10-CM | POA: Diagnosis not present

## 2023-11-17 DIAGNOSIS — Z8673 Personal history of transient ischemic attack (TIA), and cerebral infarction without residual deficits: Secondary | ICD-10-CM | POA: Diagnosis not present

## 2023-11-19 DIAGNOSIS — Z131 Encounter for screening for diabetes mellitus: Secondary | ICD-10-CM | POA: Diagnosis not present

## 2023-11-19 DIAGNOSIS — K625 Hemorrhage of anus and rectum: Secondary | ICD-10-CM | POA: Diagnosis not present

## 2023-11-22 ENCOUNTER — Ambulatory Visit (INDEPENDENT_AMBULATORY_CARE_PROVIDER_SITE_OTHER): Payer: Medicare HMO

## 2023-11-22 DIAGNOSIS — I639 Cerebral infarction, unspecified: Secondary | ICD-10-CM

## 2023-11-22 DIAGNOSIS — R531 Weakness: Secondary | ICD-10-CM | POA: Diagnosis not present

## 2023-11-22 LAB — CUP PACEART REMOTE DEVICE CHECK
Date Time Interrogation Session: 20250323230201
Implantable Pulse Generator Implant Date: 20210111

## 2023-11-24 NOTE — Progress Notes (Signed)
 Carelink Summary Report / Loop Recorder

## 2023-11-24 NOTE — Addendum Note (Signed)
 Addended by: Geralyn Flash D on: 11/24/2023 03:38 PM   Modules accepted: Orders

## 2023-11-26 ENCOUNTER — Telehealth: Payer: Self-pay | Admitting: Cardiovascular Disease

## 2023-11-26 NOTE — Telephone Encounter (Signed)
 Pt c/o medication issue:  1. Name of Medication: Evolocumab (REPATHA SURECLICK) 140 MG/ML SOAJ   2. How are you currently taking this medication (dosage and times per day)? Yes  3. Are you having a reaction (difficulty breathing--STAT)? Yes  4. What is your medication issue? Pt insurance ([employer]) would like a prior auth for this medication. Please advise

## 2023-11-27 ENCOUNTER — Encounter: Payer: Self-pay | Admitting: Cardiology

## 2023-11-29 ENCOUNTER — Other Ambulatory Visit (HOSPITAL_COMMUNITY): Payer: Self-pay

## 2023-11-29 ENCOUNTER — Telehealth: Payer: Self-pay | Admitting: Pharmacy Technician

## 2023-11-29 DIAGNOSIS — K921 Melena: Secondary | ICD-10-CM | POA: Diagnosis not present

## 2023-11-29 NOTE — Telephone Encounter (Signed)
 Pharmacy Patient Advocate Encounter  Received notification from  [employer]  that Prior Authorization for Repatha has been APPROVED from 11/29/23 to 11/28/24. Unable to obtain price due to refill too soon rejection, last fill date 11/18/23 next available fill date04/10/25   PA #/Case ID/Reference #: [id number]

## 2023-11-29 NOTE — Telephone Encounter (Signed)
 Informed patient that the PA for Repatha has been approved. He currently has 8 refills left. Updated his preferred pharmacy to [facility].

## 2023-11-29 NOTE — Telephone Encounter (Signed)
 Pharmacy Patient Advocate Encounter   Received notification from Pt Calls Messages that prior authorization for Repatha is required/requested.   Insurance verification completed.   The patient is insured through  [employer]  .   Per test claim: PA required; PA submitted to above mentioned insurance via Fax Key/confirmation #/EOC faxed Status is pending

## 2023-12-02 DIAGNOSIS — Z860101 Personal history of adenomatous and serrated colon polyps: Secondary | ICD-10-CM | POA: Diagnosis not present

## 2023-12-02 DIAGNOSIS — K921 Melena: Secondary | ICD-10-CM | POA: Diagnosis not present

## 2023-12-02 DIAGNOSIS — Z9889 Other specified postprocedural states: Secondary | ICD-10-CM | POA: Diagnosis not present

## 2023-12-02 DIAGNOSIS — Z9229 Personal history of other drug therapy: Secondary | ICD-10-CM | POA: Diagnosis not present

## 2023-12-03 ENCOUNTER — Telehealth (HOSPITAL_COMMUNITY): Payer: Self-pay

## 2023-12-03 NOTE — Telephone Encounter (Signed)
 Patient called stating that he's been having rectal bleeding and PCP stopped Xarleto. Wanted to know if he could take aspirin or Xarleto. Bryce Logan stated he need to wait until the colonoscopy.

## 2023-12-13 ENCOUNTER — Telehealth: Payer: Self-pay

## 2023-12-13 ENCOUNTER — Telehealth: Payer: Self-pay | Admitting: *Deleted

## 2023-12-13 NOTE — Telephone Encounter (Signed)
   Pre-operative Risk Assessment    Patient Name: Bryce Logan.  DOB: 1950-11-05 MRN: [id number]   Date of last office visit: 06/11/23 DR. PARKER Date of next office visit: NONE   CLEARANCE REQUEST STATES A NOTES 3RD ATTEMPT; I HAVE REVIEWED THE CHART AND DO NOT SEE THAT WE EVER RECEIVED THE REQUEST. [facility] TAKES PRIDE IN BEING SURE TO COMPLETE ANY CLEARANCE REQUEST IN A TIMELY MANNER AS TO NOT CAUSE ANY DELAYS.  Request for Surgical Clearance    Procedure:   COLONOSCOPY  (BLOOD IN STOOL)  Date of Surgery:  Clearance 12/29/23                                Surgeon:  DR. Veronda Goody Surgeon's Group or Practice Name:  [facility] Phone number:  [phone] Fax number:  [phone]   Type of Clearance Requested:   - Medical  - Pharmacy:  Hold Rivaroxaban (Xarelto) x 24 HOURS PRIOR AND 24 HOURS POST OP  (PER FORM STOP DATE 12/28/23 AND START DATE 12/30/23)   Type of Anesthesia:   PROPOFOL    Additional requests/questions:    Signed, Laikynn Pollio   12/13/2023, 10:42 AM

## 2023-12-13 NOTE — Telephone Encounter (Signed)
  Patient Consent for Virtual Visit        Bryce Logan. has provided verbal consent on 12/13/2023 for a virtual visit (video or telephone).   CONSENT FOR VIRTUAL VISIT FOR:  Bryce Logan.  By participating in this virtual visit I agree to the following:  I hereby voluntarily request, consent and authorize [facility] and its employed or contracted physicians, physician assistants, nurse practitioners or other licensed health care professionals (the Practitioner), to provide me with telemedicine health care services (the "Services") as deemed necessary by the treating Practitioner. I acknowledge and consent to receive the Services by the Practitioner via telemedicine. I understand that the telemedicine visit will involve communicating with the Practitioner through live audiovisual communication technology and the disclosure of certain medical information by electronic transmission. I acknowledge that I have been given the opportunity to request an in-person assessment or other available alternative prior to the telemedicine visit and am voluntarily participating in the telemedicine visit.  I understand that I have the right to withhold or withdraw my consent to the use of telemedicine in the course of my care at any time, without affecting my right to future care or treatment, and that the Practitioner or I may terminate the telemedicine visit at any time. I understand that I have the right to inspect all information obtained and/or recorded in the course of the telemedicine visit and may receive copies of available information for a reasonable fee.  I understand that some of the potential risks of receiving the Services via telemedicine include:  Delay or interruption in medical evaluation due to technological equipment failure or disruption; Information transmitted may not be sufficient (e.g. poor resolution of images) to allow for appropriate medical decision making by the  Practitioner; and/or  In rare instances, security protocols could fail, causing a breach of personal health information.  Furthermore, I acknowledge that it is my responsibility to provide information about my medical history, conditions and care that is complete and accurate to the best of my ability. I acknowledge that Practitioner's advice, recommendations, and/or decision may be based on factors not within their control, such as incomplete or inaccurate data provided by me or distortions of diagnostic images or specimens that may result from electronic transmissions. I understand that the practice of medicine is not an exact science and that Practitioner makes no warranties or guarantees regarding treatment outcomes. I acknowledge that a copy of this consent can be made available to me via my patient portal [facility] MyChart), or I can request a printed copy by calling the office of [facility].    I understand that my insurance will be billed for this visit.   I have read or had this consent read to me. I understand the contents of this consent, which adequately explains the benefits and risks of the Services being provided via telemedicine.  I have been provided ample opportunity to ask questions regarding this consent and the Services and have had my questions answered to my satisfaction. I give my informed consent for the services to be provided through the use of telemedicine in my medical care

## 2023-12-13 NOTE — Telephone Encounter (Signed)
   Name: Bryce R Lento Jr.  DOB: 1951-01-17  MRN: [id number]  Primary Cardiologist: Oneil Bigness, MD   Preoperative team, please contact this patient and set up a phone call appointment for further preoperative risk assessment. Please obtain consent and complete medication review. Thank you for your help.  I confirm that guidance regarding antiplatelet and oral anticoagulation therapy has been completed and, if necessary, noted below.  Per office protocol, patient can hold Xarelto for 1 day prior to procedure.   I also confirmed the patient resides in the state of [place] . As per [place] [facility] [employer] telemedicine laws, the patient must reside in the state in which the provider is licensed.   Ava Boatman, NP 12/13/2023, 12:33 PM [facility]

## 2023-12-13 NOTE — Telephone Encounter (Signed)
 Spoke with patient who is agreeable to do a tele visit on 4/22 at 1:40 pm. Med rec and consent have been done.

## 2023-12-13 NOTE — Telephone Encounter (Signed)
 Patient with diagnosis of afib on Xarelto for anticoagulation.    Procedure: COLONOSCOPY  (BLOOD IN STOOL)  Date of procedure: 12/29/23   CHA2DS2-VASc Score = 4   This indicates a 4.8% annual risk of stroke. The patient's score is based upon: CHF History: 0 HTN History: 0 Diabetes History: 0 Stroke History: 2 Vascular Disease History: 1 (CAD on chest CT) Age Score: 1 Gender Score: 0      CrCl 69.5 ml/min Platelet count 217  Per office protocol, patient can hold Xarelto for 1 day prior to procedure.    **This guidance is not considered finalized until pre-operative APP has relayed final recommendations.**

## 2023-12-21 ENCOUNTER — Ambulatory Visit: Attending: Cardiology | Admitting: Nurse Practitioner

## 2023-12-21 DIAGNOSIS — Z01810 Encounter for preprocedural cardiovascular examination: Secondary | ICD-10-CM | POA: Diagnosis not present

## 2023-12-21 NOTE — Progress Notes (Signed)
 Virtual Visit via Telephone Note   Because of Bryce Logan. co-morbid illnesses, he is at least at moderate risk for complications without adequate follow up.  This format is felt to be most appropriate for this patient at this time.  Due to technical limitations with video connection (technology), today's appointment will be conducted as an audio only telehealth visit, and [name] verbally agreed to proceed in this manner.   All issues noted in this document were discussed and addressed.  No physical exam could be performed with this format.  Evaluation Performed:  Preoperative cardiovascular risk assessment _____________   Date:  12/21/2023   Patient ID:  [name]., DOB 1951/05/14, MRN [id number] Patient Location:  Home Provider location:   Office  Primary Care Provider:  Perley Bradley, MD Primary Cardiologist:  Oneil Bigness, MD  Chief Complaint / Patient Profile   73 y.o. y/o male with a h/o paroxysmal atrial fibrillation, CAD, CVA, s/p ILR, dilated sinus of valsalva, and hyperlipidemia who is pending colonoscopy on 12/29/2023 with Dr. Veronda Goody of [facility] and presents today for telephonic preoperative cardiovascular risk assessment.  History of Present Illness    Bryce Logan. is a 73 y.o. male who presents via audio/video conferencing for a telehealth visit today.  Pt was last seen in cardiology clinic on 06/11/2023 by Dr. Daneil Dunker. At that time Lionel R Stockert Jr. was doing well.  The patient is now pending procedure as outlined above. Since his last visit, he has done well from a cardiac standpoint.  He is active, he plays golf regularly.  He denies chest pain, palpitations, dyspnea, pnd, orthopnea, n, v, dizziness, syncope, edema, weight gain, or early satiety. All other systems reviewed and are otherwise negative except as noted above.   Past Medical History    Past Medical History:  Diagnosis Date   Ascending aortic aneurysm (HCC)    03/02/18 stable CT asc  aorta 41 mm, 42 mm previously. also note aortic atherosclerosis.  (per [facility] notes).   Chronic prostatitis    ELEVATED PSA . ED DR. DAHLSTEDT   Coronary artery disease    Depression    Diverticulosis 01/2017   in the sigmoid colon and in the descending colon, noted on colonoscopy. repeat in 5 yrs   Dizziness 07/2010   DR. ROSEN, NORMAL EF, MILD LEAKINESS OF MITRAL VALVE, MILD STIFFNESS OF HEART MUSCLE, MILD ENLARGEMENT OF AORTIC ROOT, REPEAT IN 1 YEAR DR. Micael Adas, NL ETT DR. Micael Adas 12/11   H/O: GI bleed 2002   DR. HAYES    Hyperlipidemia    Insomnia    ON XANAX , DR. MILLER   Intracerebral hematoma (HCC) 2013   [facility]   Migraine headache    Mild bipolar disorder (HCC) 01/2010   DR. PITTMAN DR. Deborra Falter, CHR DEPRESSION, FATIGUE OFF MEDS FOR AWHILE, CONTROLLED   Side effect of drug    side effects with statins   Stroke (HCC) 09/07/2019   per notes from [facility]   Tubular adenoma 09/17/2011   [facility]   Past Surgical History:  Procedure Laterality Date   BUBBLE STUDY  09/12/2019   Procedure: BUBBLE STUDY;  Surgeon: Hazle Lites, MD;  Location: [facility] ENDOSCOPY;  Service: Cardiovascular;;   COLONOSCOPY  08/31/2000   IR CT HEAD LTD  09/07/2019   IR PERCUTANEOUS ART THROMBECTOMY/INFUSION INTRACRANIAL INC DIAG ANGIO  09/07/2019   IR RADIOLOGIST EVAL & MGMT  06/25/2021   L  MCA angioplasty, admitted 09/07/19-09/12/19 stroke     LEFT KNEE ATHROSCOPY  08/31/2000   LEFT LEG AMBULATORY PHLEBECTOMY  08/31/2000   LOOP RECORDER INSERTION N/A 09/12/2019   Procedure: LOOP RECORDER INSERTION;  Surgeon: Jolly Needle, MD;  Location: [facility] INVASIVE CV LAB;  Service: Cardiovascular;  Laterality: N/A;   RADIOLOGY WITH ANESTHESIA N/A 09/07/2019   Procedure: RADIOLOGY WITH ANESTHESIA;  Surgeon: Luellen Sages, MD;  Location: [facility] OR;  Service: Radiology;  Laterality: N/A;   TEE WITHOUT CARDIOVERSION N/A 09/12/2019   Procedure: TRANSESOPHAGEAL ECHOCARDIOGRAM  (TEE);  Surgeon: Hazle Lites, MD;  Location: [facility] ENDOSCOPY;  Service: Cardiovascular;  Laterality: N/A;    Allergies  Allergies  Allergen Reactions   Statins     Side effects   Sulfa Antibiotics     SEVERE FATIGUE AND SIDE EFFECTS    Home Medications    Prior to Admission medications   Medication Sig Start Date End Date Taking? Authorizing Provider  Acetylcysteine (NAC PO) Take 1,000-2,000 mg by mouth.    [provider]  ALPRAZolam  (XANAX ) 0.5 MG tablet Take 1 mg by mouth at bedtime.  09/21/16   [provider]  Ascorbic Acid (VITAMIN C) 1000 MG tablet Take 1,000 mg by mouth daily.    [provider]  Betaine, Trimethylglycine, (TMG, TRIMETHYLGLYCINE, PO) Take 1,000 mg by mouth daily.    [provider]  Cholecalciferol (VITAMIN D-3) 125 MCG (5000 UT) TABS Take 5,000 Units by mouth daily.    [provider]  Coenzyme Q10 (CO Q 10) 100 MG CAPS Take 1 capsule by mouth daily.    [provider]  Collagen Hydrolysate, Bovine, POWD 12 g daily. 02/11/23   [provider]  Creatine Monohydrate POWD 5 g daily. 02/11/23   [provider]  Evolocumab  (REPATHA  SURECLICK) 140 MG/ML SOAJ Inject 140 mg into the skin every 14 (fourteen) days. 08/18/23   Harrold Lincoln, MD  hydrocortisone (ANUSOL-HC) 25 MG suppository  04/26/23   [provider]  Magnesium 400 MG CAPS Take 1 capsule by mouth daily. With a meal    [provider]  Melatonin 300 MCG TABS 600 mcg daily. 01/29/22   [provider]  Menaquinone-7 (VITAMIN K2 PO) Take 1 tablet by mouth daily.    [provider]  Niacinamide-Zn-Cu-Methfo-Se-Cr (NICOTINAMIDE PO) Take 1 tablet by mouth daily. 500-750 mg tablet    [provider]  NON FORMULARY Take 10 mg by mouth daily. LITHIUM OROTATE    [provider]  Omega-3 Fatty Acids (FISH OIL) 1000 MG CAPS  08/31/22   [provider]  rivaroxaban  (XARELTO ) 20 MG  TABS tablet Take 1 tablet (20 mg total) by mouth daily with supper. Patient not taking: Reported on 12/13/2023 10/01/23   Fenton, Clint R, PA  tadalafil (CIALIS) 5 MG tablet Take 5 mg by mouth daily as needed for erectile dysfunction.    [provider]  tamsulosin (FLOMAX) 0.4 MG CAPS capsule  04/26/23   [provider]  traZODone (DESYREL) 50 MG tablet Take 50 mg by mouth at bedtime as needed for sleep.    [provider]  TURMERIC CURCUMIN PO 665 mg daily. 01/13/23   [provider]  ZINC PICOLINATE PO  04/01/23   [provider]    Physical Exam    Vital Signs:  [name]. does not have vital signs available for review today.  Given telephonic nature of communication, physical exam is limited. AAOx3. NAD. Normal affect.  Speech and respirations are unlabored.  Accessory Clinical Findings    None  Assessment & Plan    1.  Preoperative Cardiovascular Risk Assessment:  According to the Revised Cardiac Risk Index (RCRI), his Perioperative Risk of Major Cardiac Event is (%): 0.9. His Functional Capacity in METs is: 7.99 according to the Duke Activity Status Index (DASI).Therefore, based on ACC/AHA guidelines, patient would be at acceptable risk for the planned procedure without further cardiovascular testing.  The patient was advised that if he develops new symptoms prior to surgery to contact our office to arrange for a follow-up visit, and he verbalized understanding.   Patient states that he has not been taking his Xarelto  in the setting of rectal bleeding.  He does not intend to resume his Xarelto  prior to his colonoscopy.  We did discuss the risk of stroke in the setting.  Patient verbalized understanding.  Should he resume his Xarelto  prior to his procedure, per office protocol, patient can hold Xarelto  for 1 day prior to procedure. Please resume Xarelto  as soon as possible postprocedure, at the discretion of the surgeon.    A copy of  this note will be routed to requesting surgeon.  Time:   Today, I have spent 9 minutes with the patient with telehealth technology discussing medical history, symptoms, and management plan.     Jude Norton, NP  12/21/2023, 1:51 PM

## 2023-12-24 ENCOUNTER — Telehealth: Payer: Self-pay | Admitting: Cardiovascular Disease

## 2023-12-24 NOTE — Telephone Encounter (Signed)
 I spoke with the pt. He is now paying a copay with his new insurance for the remote checks. He wanted to cancel all his remotes. I asked him will he be willing to do every 3 months instead? He has agreed to doing the remote checks every 3 months instead of every month. I put him on a 91 day schedule. I told him I will let Dr. Marven Slimmer know.

## 2023-12-24 NOTE — Telephone Encounter (Signed)
 Patient calling in to speak with the nurse about about a coding being billed. Please advise

## 2023-12-29 DIAGNOSIS — D124 Benign neoplasm of descending colon: Secondary | ICD-10-CM | POA: Diagnosis not present

## 2023-12-29 DIAGNOSIS — Z860101 Personal history of adenomatous and serrated colon polyps: Secondary | ICD-10-CM | POA: Diagnosis not present

## 2023-12-29 DIAGNOSIS — K5730 Diverticulosis of large intestine without perforation or abscess without bleeding: Secondary | ICD-10-CM | POA: Diagnosis not present

## 2023-12-29 DIAGNOSIS — Z09 Encounter for follow-up examination after completed treatment for conditions other than malignant neoplasm: Secondary | ICD-10-CM | POA: Diagnosis not present

## 2023-12-29 DIAGNOSIS — K625 Hemorrhage of anus and rectum: Secondary | ICD-10-CM | POA: Diagnosis not present

## 2023-12-31 DIAGNOSIS — D124 Benign neoplasm of descending colon: Secondary | ICD-10-CM | POA: Diagnosis not present

## 2024-01-10 NOTE — Progress Notes (Signed)
 Carelink Summary Report / Loop Recorder

## 2024-01-12 DIAGNOSIS — N5201 Erectile dysfunction due to arterial insufficiency: Secondary | ICD-10-CM | POA: Diagnosis not present

## 2024-01-12 DIAGNOSIS — N401 Enlarged prostate with lower urinary tract symptoms: Secondary | ICD-10-CM | POA: Diagnosis not present

## 2024-01-12 DIAGNOSIS — R3912 Poor urinary stream: Secondary | ICD-10-CM | POA: Diagnosis not present

## 2024-01-12 DIAGNOSIS — R102 Pelvic and perineal pain: Secondary | ICD-10-CM | POA: Diagnosis not present

## 2024-01-13 DIAGNOSIS — I639 Cerebral infarction, unspecified: Secondary | ICD-10-CM | POA: Diagnosis not present

## 2024-01-13 DIAGNOSIS — E782 Mixed hyperlipidemia: Secondary | ICD-10-CM | POA: Diagnosis not present

## 2024-01-14 LAB — LIPID PANEL
Chol/HDL Ratio: 2.2 ratio (ref 0.0–5.0)
Cholesterol, Total: 178 mg/dL (ref 100–199)
HDL: 81 mg/dL (ref >39)
LDL Chol Calc (NIH): 86 mg/dL (ref 0–99)
Triglycerides: 54 mg/dL (ref 0–149)
VLDL Cholesterol Cal: 11 mg/dL (ref 5–40)

## 2024-01-20 ENCOUNTER — Ambulatory Visit: Payer: Self-pay | Admitting: Pharmacist

## 2024-02-07 DIAGNOSIS — G479 Sleep disorder, unspecified: Secondary | ICD-10-CM | POA: Diagnosis not present

## 2024-02-07 DIAGNOSIS — E673 Hypervitaminosis D: Secondary | ICD-10-CM | POA: Diagnosis not present

## 2024-02-07 DIAGNOSIS — K648 Other hemorrhoids: Secondary | ICD-10-CM | POA: Diagnosis not present

## 2024-02-15 DIAGNOSIS — K080 Exfoliation of teeth due to systemic causes: Secondary | ICD-10-CM | POA: Diagnosis not present

## 2024-02-26 ENCOUNTER — Encounter (HOSPITAL_COMMUNITY): Payer: Self-pay | Admitting: Interventional Radiology

## 2024-03-01 DIAGNOSIS — L814 Other melanin hyperpigmentation: Secondary | ICD-10-CM | POA: Diagnosis not present

## 2024-03-01 DIAGNOSIS — L538 Other specified erythematous conditions: Secondary | ICD-10-CM | POA: Diagnosis not present

## 2024-03-01 DIAGNOSIS — L821 Other seborrheic keratosis: Secondary | ICD-10-CM | POA: Diagnosis not present

## 2024-03-01 DIAGNOSIS — D225 Melanocytic nevi of trunk: Secondary | ICD-10-CM | POA: Diagnosis not present

## 2024-03-01 DIAGNOSIS — L820 Inflamed seborrheic keratosis: Secondary | ICD-10-CM | POA: Diagnosis not present

## 2024-03-01 DIAGNOSIS — L578 Other skin changes due to chronic exposure to nonionizing radiation: Secondary | ICD-10-CM | POA: Diagnosis not present

## 2024-03-23 ENCOUNTER — Ambulatory Visit: Payer: Self-pay | Admitting: Cardiology

## 2024-03-23 ENCOUNTER — Ambulatory Visit

## 2024-03-23 DIAGNOSIS — I639 Cerebral infarction, unspecified: Secondary | ICD-10-CM

## 2024-03-23 LAB — CUP PACEART REMOTE DEVICE CHECK
Date Time Interrogation Session: 20250723230315
Implantable Pulse Generator Implant Date: 20210111

## 2024-03-29 DIAGNOSIS — M62838 Other muscle spasm: Secondary | ICD-10-CM | POA: Diagnosis not present

## 2024-03-29 DIAGNOSIS — M6281 Muscle weakness (generalized): Secondary | ICD-10-CM | POA: Diagnosis not present

## 2024-03-29 DIAGNOSIS — R102 Pelvic and perineal pain: Secondary | ICD-10-CM | POA: Diagnosis not present

## 2024-06-01 NOTE — Progress Notes (Signed)
 Remote Loop Recorder Transmission

## 2024-06-05 DIAGNOSIS — M62830 Muscle spasm of back: Secondary | ICD-10-CM | POA: Diagnosis not present

## 2024-06-21 ENCOUNTER — Encounter

## 2024-06-22 ENCOUNTER — Ambulatory Visit

## 2024-06-22 ENCOUNTER — Encounter

## 2024-06-22 DIAGNOSIS — I639 Cerebral infarction, unspecified: Secondary | ICD-10-CM | POA: Diagnosis not present

## 2024-06-22 LAB — CUP PACEART REMOTE DEVICE CHECK
Date Time Interrogation Session: 20251022230414
Implantable Pulse Generator Implant Date: 20210111

## 2024-06-24 NOTE — Progress Notes (Signed)
 Remote Loop Recorder Transmission

## 2024-06-26 ENCOUNTER — Ambulatory Visit: Payer: Self-pay | Admitting: Cardiology

## 2024-06-30 ENCOUNTER — Other Ambulatory Visit: Payer: Self-pay | Admitting: Pharmacist Clinician (PhC)/ Clinical Pharmacy Specialist

## 2024-06-30 MED ORDER — REPATHA SURECLICK 140 MG/ML ~~LOC~~ SOAJ: 140.0000 mg | SUBCUTANEOUS | 11 refills | Status: AC

## 2024-07-14 DIAGNOSIS — R3912 Poor urinary stream: Secondary | ICD-10-CM | POA: Diagnosis not present

## 2024-07-14 DIAGNOSIS — N5203 Combined arterial insufficiency and corporo-venous occlusive erectile dysfunction: Secondary | ICD-10-CM | POA: Diagnosis not present

## 2024-07-14 DIAGNOSIS — N401 Enlarged prostate with lower urinary tract symptoms: Secondary | ICD-10-CM | POA: Diagnosis not present

## 2024-07-22 ENCOUNTER — Encounter

## 2024-08-07 DIAGNOSIS — N401 Enlarged prostate with lower urinary tract symptoms: Secondary | ICD-10-CM | POA: Diagnosis not present

## 2024-08-07 DIAGNOSIS — E673 Hypervitaminosis D: Secondary | ICD-10-CM | POA: Diagnosis not present

## 2024-08-07 DIAGNOSIS — I480 Paroxysmal atrial fibrillation: Secondary | ICD-10-CM | POA: Diagnosis not present

## 2024-08-07 DIAGNOSIS — G479 Sleep disorder, unspecified: Secondary | ICD-10-CM | POA: Diagnosis not present

## 2024-08-07 DIAGNOSIS — Z1331 Encounter for screening for depression: Secondary | ICD-10-CM | POA: Diagnosis not present

## 2024-08-07 DIAGNOSIS — Z0000 Encounter for general adult medical examination without abnormal findings: Secondary | ICD-10-CM | POA: Diagnosis not present

## 2024-08-07 DIAGNOSIS — Z23 Encounter for immunization: Secondary | ICD-10-CM | POA: Diagnosis not present

## 2024-08-21 ENCOUNTER — Telehealth: Payer: Self-pay

## 2024-08-21 NOTE — Telephone Encounter (Signed)
 Pt called in stating that he thinks he is in afib and would like someone to call back and reassure

## 2024-08-21 NOTE — Telephone Encounter (Signed)
 Pt called to advise he had an episode of Afib on August 18, 2024.  Per review of Carelink, Pt did indeed have a 4 hour episode of Afib on that day.  He is not taking an OAC d/t bleeding issues caused by hemmorhoids.  He is wondering if he should try resuming his Xarelto .    Advised Pt would recommend an Afib clinic visit to discuss options and make sure current labs are WNL.  Pt in agreement.  Afib clinic visit scheduled.

## 2024-08-22 ENCOUNTER — Encounter

## 2024-08-28 ENCOUNTER — Ambulatory Visit (HOSPITAL_COMMUNITY)
Admission: RE | Admit: 2024-08-28 | Discharge: 2024-08-28 | Disposition: A | Discharge status: Home / Self Care | Source: Ambulatory Visit | Attending: Internal Medicine | Admitting: Internal Medicine

## 2024-08-28 VITALS — BP 112/70 | HR 74 | Ht 67.0 in | Wt 148.6 lb

## 2024-08-28 DIAGNOSIS — D6869 Other thrombophilia: Secondary | ICD-10-CM | POA: Diagnosis not present

## 2024-08-28 DIAGNOSIS — I4891 Unspecified atrial fibrillation: Secondary | ICD-10-CM | POA: Diagnosis not present

## 2024-08-28 DIAGNOSIS — I480 Paroxysmal atrial fibrillation: Secondary | ICD-10-CM

## 2024-08-28 LAB — CBC
Hematocrit: 44.8 % (ref 37.5–51.0)
Hemoglobin: 15 g/dL (ref 13.0–17.7)
MCH: 31.6 pg (ref 26.6–33.0)
MCHC: 33.5 g/dL (ref 31.5–35.7)
MCV: 94 fL (ref 79–97)
Platelets: 169 x10E3/uL (ref 150–450)
RBC: 4.75 x10E6/uL (ref 4.14–5.80)
RDW: 14 % (ref 11.6–15.4)
WBC: 4.8 x10E3/uL (ref 3.4–10.8)

## 2024-08-28 MED ORDER — RIVAROXABAN 20 MG PO TABS: 20.0000 mg | ORAL_TABLET | Freq: Every day | ORAL | 6 refills | Status: AC

## 2024-08-28 NOTE — Patient Instructions (Addendum)
 Start Xarelto  20mg  once a day with supper    Have Lab drawn in 70month at any [facility] - orders attached

## 2024-08-28 NOTE — Progress Notes (Addendum)
 "   Primary Care Physician: Cleotilde Planas, MD Primary Cardiologist: Darryle ONEIDA Decent, MD Electrophysiologist: OLE ONEIDA HOLTS, MD  Referring Physician: Device clinic/Dr Holts Norleen SAUNDERS [name]. is a 73 y.o. male with a history of CVA, HLD, CAD, atrial fibrillation who presents for follow up in the [facility].  The patient was initially diagnosed with atrial fibrillation 05/06/23 on his ILR which was placed for cryptogenic stroke in 2021. The episode lasted ~3 hours. Patient has a CHADS2VASC score of 4.  Follow-up 08/28/2024.  He contacted office on 12/22 noting he was probably in A-fib and device clinic confirmed he had an episode on 12/19.  Review of ILR shows 1 episode of A-fib on 12/19 with duration of 4 hours.  His last episode of A-fib was on 05/2023.  He is not currently on Xarelto  due to history of bleeding issues caused by hemorrhoids.  Patient notes he received news the day before A-fib episode in which he was extremely emotionally upset.  He did not sleep that well and the following morning had the 4-hour episode of A-fib.  He notes to still have an intermittent bleeding issue with hemorrhoids, but notes it has not been at a significant level of bleeding as it was several years ago.  He notes to have had a CBC done at primary care doctor's office several months ago and it was normal.  He is concerned about his risk for recurrent stroke related to A-fib.  Today, he denies symptoms of palpitations, chest pain, shortness of breath, orthopnea, PND, lower extremity edema, dizziness, presyncope, syncope, snoring, daytime somnolence, bleeding, or neurologic sequela. The patient is tolerating medications without difficulties and is otherwise without complaint today.    Atrial Fibrillation Risk Factors:  he does not have symptoms or diagnosis of sleep apnea. Negative sleep study per patient report he does not have a history of rheumatic fever.   Atrial Fibrillation  Management history:  Previous antiarrhythmic drugs: none Previous cardioversions: none Previous ablations: none Anticoagulation history: Eliquis , Xarelto    ROS- All systems are reviewed and negative except as per the HPI above.  Past Medical History:  Diagnosis Date   Ascending aortic aneurysm    03/02/18 stable CT asc aorta 41 mm, 42 mm previously. also note aortic atherosclerosis.  (per [facility] notes).   Chronic prostatitis    ELEVATED PSA . ED DR. DAHLSTEDT   Coronary artery disease    Depression    Diverticulosis 01/2017   in the sigmoid colon and in the descending colon, noted on colonoscopy. repeat in 5 yrs   Dizziness 07/2010   DR. ROSEN, NORMAL EF, MILD LEAKINESS OF MITRAL VALVE, MILD STIFFNESS OF HEART MUSCLE, MILD ENLARGEMENT OF AORTIC ROOT, REPEAT IN 1 YEAR DR. SHLOMO, NL ETT DR. SHLOMO 12/11   H/O: GI bleed 2002   DR. HAYES    Hyperlipidemia    Insomnia    ON XANAX , DR. MILLER   Intracerebral hematoma (HCC) 2013   [facility]   Migraine headache    Mild bipolar disorder (HCC) 01/2010   DR. PITTMAN DR. VINCENTE, CHR DEPRESSION, FATIGUE OFF MEDS FOR AWHILE, CONTROLLED   Side effect of drug    side effects with statins   Stroke (HCC) 09/07/2019   per notes from [facility]   Tubular adenoma 09/17/2011   [facility]    Current Outpatient Medications  Medication Sig Dispense Refill   Acetylcysteine (NAC PO) Take 1,000-2,000 mg by mouth. (Patient  taking differently: Take 1,000 mg by mouth every morning.)     ALPRAZolam  (XANAX ) 0.5 MG tablet Take 1 mg by mouth at bedtime.  (Patient taking differently: Take 0.5 tablets by mouth at bedtime.)  1   Ascorbic Acid (VITAMIN C) 1000 MG tablet Take 1,000 mg by mouth daily.     Betaine, Trimethylglycine, (TMG, TRIMETHYLGLYCINE, PO) Take 1,000 mg by mouth daily.     Cholecalciferol (VITAMIN D-3) 125 MCG (5000 UT) TABS Take 5,000 Units by mouth daily. (Patient taking differently: Take 2,000  Units by mouth daily.)     Collagen Hydrolysate, Bovine, POWD 12 g daily.     Creatine Monohydrate POWD 5 g daily.     Evolocumab  (REPATHA  SURECLICK) 140 MG/ML SOAJ Inject 140 mg into the skin every 14 (fourteen) days. 2 mL 11   hydrocortisone (ANUSOL-HC) 25 MG suppository  (Patient taking differently: Place 25 mg rectally as needed.)     Magnesium 400 MG CAPS Take 1 capsule by mouth daily. With a meal     Menaquinone-7 (VITAMIN K2 PO) Take 1 tablet by mouth daily.     Multiple Vitamin (MULTIVITAMIN WITH MINERALS) TABS tablet Take 1 tablet by mouth daily. saffron     Niacinamide-Zn-Cu-Methfo-Se-Cr (NICOTINAMIDE PO) Take 1 tablet by mouth daily. 500-750 mg tablet (Patient taking differently: Take 500 mg by mouth 2 (two) times daily.)     NON FORMULARY Take 10 mg by mouth daily. LITHIUM OROTATE     Omega-3 Fatty Acids (FISH OIL) 1000 MG CAPS  (Patient taking differently: Taking 2 grams daily- liquid form)     tadalafil (CIALIS) 5 MG tablet Take 5 mg by mouth daily as needed for erectile dysfunction. (Patient taking differently: Take 5 mg by mouth every morning.)     tamsulosin (FLOMAX) 0.4 MG CAPS capsule  (Patient taking differently: Take 0.4 mg by mouth as needed.)     traZODone (DESYREL) 50 MG tablet Take 50 mg by mouth at bedtime as needed for sleep. (Patient taking differently: Take 150 mg by mouth at bedtime.)     ZINC PICOLINATE PO  (Patient taking differently: Take 1 tablet by mouth every morning. 22 mg)     rivaroxaban  (XARELTO ) 20 MG TABS tablet Take 1 tablet (20 mg total) by mouth daily with supper. 30 tablet 6   No current facility-administered medications for this encounter.    Physical Exam: BP 112/70   Pulse 74   Ht 5' 7 (1.702 m)   Wt 67.4 kg   BMI 23.27 kg/m   GEN- The patient is well appearing, alert and oriented x 3 today.   Neck - no JVD or carotid bruit noted Lungs- Clear to ausculation bilaterally, normal work of breathing Heart- Regular rate and rhythm, no  murmurs, rubs or gallops, PMI not laterally displaced Extremities- no clubbing, cyanosis, or edema Skin - no rash or ecchymosis noted   Wt Readings from Last 3 Encounters:  08/28/24 67.4 kg  06/11/23 68.4 kg  06/07/23 66.8 kg     EKG today demonstrates  EKG Interpretation Date/Time:  Monday August 28 2024 14:02:51 EST Ventricular Rate:  74 PR Interval:  158 QRS Duration:  96 QT Interval:  374 QTC Calculation: 415 R Axis:   72  Text Interpretation: Normal sinus rhythm Normal ECG When compared with ECG of 07-Jun-2023 14:03, No significant change was found Confirmed by Terra Pac [id number]) on 08/28/2024 2:33:16 PM    Echo 09/08/19 demonstrated   1. Left ventricular ejection fraction, by visual estimation,  is 60 to  65%. The left ventricle has normal function. There is no left ventricular  hypertrophy. Normal diastolic function   2. Global right ventricle has normal systolic function.The right  ventricular size is normal. No increase in right ventricular wall  thickness.   3. Left atrial size was normal.   4. Right atrial size was normal.   5. The mitral valve is normal in structure. Trivial mitral valve  regurgitation.   6. The tricuspid valve is normal in structure.   7. The aortic valve is tricuspid. Aortic valve regurgitation is not  visualized. No evidence of aortic valve stenosis.   8. The pulmonic valve was not well visualized. Pulmonic valve  regurgitation is not visualized.   9. TR signal is inadequate for assessing pulmonary artery systolic  pressure.  10. The inferior vena cava is dilated in size with >50% respiratory  variability, suggesting right atrial pressure of 8 mmHg.  11. There is mild dilatation of the aortic root measuring 40 mm.  12. Saline contrast bubble study was negative, with no evidence of any  interatrial shunt.    CHA2DS2-VASc Score = 4  The patient's score is based upon: CHF History: 0 HTN History: 0 Diabetes History: 0 Stroke History:  2 Vascular Disease History: 1 Age Score: 1 Gender Score: 0       ASSESSMENT AND PLAN: Paroxysmal Atrial Fibrillation (ICD10:  I48.0) The patient's CHA2DS2-VASc score is 4, indicating a 4.8% annual risk of stroke.    Patient is currently in NSR.  Review of ILR shows overall very low A-fib burden with 2 total episodes, most recently a 4-hour episode on 12/19.  It appears to be directly related to him being acutely emotionally upset.  We discussed his elevated risk for stroke related to A-fib and anticoagulation.  He was previously seen by Dr. Kennyth and due to having only 1 episode of A-fib he recommended conservative observation with plan for possible ablation and Watchman procedure in the future.  Due to overall very low burden and specific trigger for second A-fib episode.  I think it is reasonable to continue with conservative observation.  I do not believe we would need to consider ablation and/or watchman at this time.  If he has increased burden in the future, then would help patient by having him follow-up with Dr. Kennyth.  Secondary Hypercoagulable State (ICD10:  D68.69) The patient is at significant risk for stroke/thromboembolism based upon his CHA2DS2-VASc Score of 4.   He is currently not on anticoagulation.  He discussed the risks versus benefits of anticoagulation with regards to stroke prevention related to A-fib.  After discussion, patient elects to retry Xarelto . Will begin Xarelto  20 mg daily.  Will check CBC today.  Another CBC order was given for patient to draw a repeat CBC in 1 month.    Follow up 6 months A-fib clinic.      Dorn Heinrich, PA-C [facility] [address] [place], [place] [address] [phone] "

## 2024-08-29 ENCOUNTER — Ambulatory Visit (HOSPITAL_COMMUNITY): Payer: Self-pay | Admitting: Internal Medicine

## 2024-09-21 ENCOUNTER — Other Ambulatory Visit (HOSPITAL_COMMUNITY): Payer: Self-pay

## 2024-09-21 ENCOUNTER — Ambulatory Visit

## 2024-09-21 ENCOUNTER — Telehealth: Payer: Self-pay | Admitting: Cardiovascular Disease

## 2024-09-21 ENCOUNTER — Telehealth: Payer: Self-pay | Admitting: Pharmacy Technician

## 2024-09-21 ENCOUNTER — Encounter

## 2024-09-21 DIAGNOSIS — I639 Cerebral infarction, unspecified: Secondary | ICD-10-CM

## 2024-09-21 NOTE — Telephone Encounter (Signed)
 Pharmacy Patient Advocate Encounter  Received notification from [employer] that Prior Authorization for repatha  has been APPROVED from 09/21/24 to 08/30/25   PA #/Case ID/Reference #: [id number]

## 2024-09-21 NOTE — Telephone Encounter (Signed)
 PA request has been Submitted. New Encounter has been or will be created for follow up. For additional info see Pharmacy Prior Auth telephone encounter from 09/21/24.

## 2024-09-21 NOTE — Telephone Encounter (Signed)
 I will forward to rx med assist for review.

## 2024-09-21 NOTE — Telephone Encounter (Signed)
 Pt c/o medication issue:  1. Name of Medication:   Evolocumab  (REPATHA  SURECLICK) 140 MG/ML SOAJ    2. How are you currently taking this medication (dosage and times per day)? As written  3. Are you having a reaction (difficulty breathing--STAT)? no  4. What is your medication issue? [employer] is not wanting to cover this medication

## 2024-09-21 NOTE — Telephone Encounter (Signed)
" °  295.51 FIRST FILL THEN GOES TO 47.00 -HAS 250 DEDUCTIBLE WAS TRANSITION FILL SO DID PA   Pharmacy Patient Advocate Encounter   Received notification from Pt Calls Messages that prior authorization for repatha  is required/requested.   Insurance verification completed.   The patient is insured through [employer].   Per test claim: PA required; PA submitted to above mentioned insurance via Latent Key/confirmation #/EOC [id number] Status is pending  "

## 2024-09-22 ENCOUNTER — Encounter

## 2024-09-22 LAB — CUP PACEART REMOTE DEVICE CHECK
Date Time Interrogation Session: 20260121230540
Implantable Pulse Generator Implant Date: 20210111

## 2024-09-23 NOTE — Progress Notes (Signed)
 Remote Loop Recorder Transmission

## 2024-09-25 ENCOUNTER — Ambulatory Visit: Payer: Self-pay | Admitting: Cardiology

## 2024-12-21 ENCOUNTER — Ambulatory Visit

## 2024-12-21 ENCOUNTER — Encounter

## 2025-02-26 ENCOUNTER — Ambulatory Visit (HOSPITAL_COMMUNITY): Admitting: Internal Medicine

## 2025-03-22 ENCOUNTER — Ambulatory Visit

## 2025-03-22 ENCOUNTER — Encounter

## 2025-06-21 ENCOUNTER — Encounter

## 2025-09-20 ENCOUNTER — Encounter

## 2025-12-20 ENCOUNTER — Encounter
